# Patient Record
Sex: Male | Born: 1950 | Race: White | Hispanic: No | Smoking: Former smoker
Health system: Southern US, Community
[De-identification: ages and names within clinical notes are randomized; demographics above are authoritative.]

## PROBLEM LIST (undated history)

## (undated) DIAGNOSIS — R112 Nausea with vomiting, unspecified: Secondary | ICD-10-CM

## (undated) DIAGNOSIS — M1990 Unspecified osteoarthritis, unspecified site: Secondary | ICD-10-CM

## (undated) DIAGNOSIS — H3530 Unspecified macular degeneration: Secondary | ICD-10-CM

## (undated) DIAGNOSIS — M779 Enthesopathy, unspecified: Secondary | ICD-10-CM

## (undated) DIAGNOSIS — Z9889 Other specified postprocedural states: Secondary | ICD-10-CM

## (undated) DIAGNOSIS — I10 Essential (primary) hypertension: Secondary | ICD-10-CM

## (undated) DIAGNOSIS — R682 Dry mouth, unspecified: Secondary | ICD-10-CM

## (undated) DIAGNOSIS — C801 Malignant (primary) neoplasm, unspecified: Secondary | ICD-10-CM

## (undated) DIAGNOSIS — T148XXD Other injury of unspecified body region, subsequent encounter: Secondary | ICD-10-CM

## (undated) DIAGNOSIS — K219 Gastro-esophageal reflux disease without esophagitis: Secondary | ICD-10-CM

## (undated) HISTORY — PX: KNEE SURGERY: SHX244

## (undated) HISTORY — PX: WISDOM TOOTH EXTRACTION: SHX21

## (undated) HISTORY — PX: NOSE SURGERY: SHX723

---

## 1997-12-30 ENCOUNTER — Ambulatory Visit (HOSPITAL_COMMUNITY): Admission: RE | Admit: 1997-12-30 | Discharge: 1997-12-30 | Payer: Self-pay | Admitting: Specialist

## 1998-01-13 ENCOUNTER — Ambulatory Visit (HOSPITAL_BASED_OUTPATIENT_CLINIC_OR_DEPARTMENT_OTHER): Admission: RE | Admit: 1998-01-13 | Discharge: 1998-01-13 | Payer: Self-pay | Admitting: Specialist

## 2001-09-07 ENCOUNTER — Emergency Department (HOSPITAL_COMMUNITY): Admission: EM | Admit: 2001-09-07 | Discharge: 2001-09-07 | Payer: Self-pay | Admitting: Emergency Medicine

## 2012-05-15 ENCOUNTER — Encounter (HOSPITAL_COMMUNITY): Payer: Self-pay | Admitting: Emergency Medicine

## 2012-05-15 ENCOUNTER — Emergency Department (HOSPITAL_COMMUNITY)
Admission: EM | Admit: 2012-05-15 | Discharge: 2012-05-15 | Disposition: A | Discharge status: Home / Self Care | Payer: Self-pay | Attending: Emergency Medicine | Admitting: Emergency Medicine

## 2012-05-15 ENCOUNTER — Emergency Department (HOSPITAL_COMMUNITY): Payer: Self-pay

## 2012-05-15 DIAGNOSIS — M7989 Other specified soft tissue disorders: Secondary | ICD-10-CM | POA: Insufficient documentation

## 2012-05-15 DIAGNOSIS — A46 Erysipelas: Secondary | ICD-10-CM | POA: Insufficient documentation

## 2012-05-15 DIAGNOSIS — Z79899 Other long term (current) drug therapy: Secondary | ICD-10-CM | POA: Insufficient documentation

## 2012-05-15 DIAGNOSIS — I10 Essential (primary) hypertension: Secondary | ICD-10-CM | POA: Insufficient documentation

## 2012-05-15 DIAGNOSIS — Z8589 Personal history of malignant neoplasm of other organs and systems: Secondary | ICD-10-CM | POA: Insufficient documentation

## 2012-05-15 DIAGNOSIS — M1710 Unilateral primary osteoarthritis, unspecified knee: Secondary | ICD-10-CM | POA: Insufficient documentation

## 2012-05-15 HISTORY — DX: Unspecified osteoarthritis, unspecified site: M19.90

## 2012-05-15 HISTORY — DX: Essential (primary) hypertension: I10

## 2012-05-15 HISTORY — DX: Malignant (primary) neoplasm, unspecified: C80.1

## 2012-05-15 HISTORY — DX: Enthesopathy, unspecified: M77.9

## 2012-05-15 LAB — CBC WITH DIFFERENTIAL/PLATELET
Eosinophils Absolute: 0.1 10*3/uL (ref 0.0–0.7)
Hemoglobin: 16.8 g/dL (ref 13.0–17.0)
Lymphocytes Relative: 15 % (ref 12–46)
Lymphs Abs: 1.3 10*3/uL (ref 0.7–4.0)
MCH: 30.3 pg (ref 26.0–34.0)
Monocytes Relative: 10 % (ref 3–12)
Neutrophils Relative %: 74 % (ref 43–77)
RBC: 5.54 MIL/uL (ref 4.22–5.81)
WBC: 8.6 10*3/uL (ref 4.0–10.5)

## 2012-05-15 LAB — BASIC METABOLIC PANEL
BUN: 25 mg/dL — ABNORMAL HIGH (ref 6–23)
CO2: 24 mEq/L (ref 19–32)
Chloride: 99 mEq/L (ref 96–112)
GFR calc non Af Amer: 76 mL/min — ABNORMAL LOW (ref >90)
Glucose, Bld: 109 mg/dL — ABNORMAL HIGH (ref 70–99)
Potassium: 3.9 mEq/L (ref 3.5–5.1)

## 2012-05-15 MED ORDER — AMOXICILLIN 500 MG PO CAPS: 500.0000 mg | ORAL_CAPSULE | Freq: Three times a day (TID) | ORAL | Status: AC

## 2012-05-15 NOTE — ED Notes (Signed)
Pt states redness and swelling started Saturday. Denies injury or insect bite.

## 2012-05-15 NOTE — ED Provider Notes (Signed)
Medical screening examination/treatment/procedure(s) were conducted as a shared visit with non-physician practitioner(s) and myself.  I personally evaluated the patient during the encounter [name], M.D.  Harry [name] is a 61 y.o. male presenting with rash on the right lower extremity. Few days ago the patient was feeling ill, had some fevers and chills and subsequently a rash erupted on the right lower extremity to involve the anterior portion of the right lower extremity and calf.  He's not had any injuries to the area, he denies any history of DVT or PE, and has not had any long travel. Patient wants to know what this is, he says the severity is moderate to severe, is been constant, it has been somewhat improving because he feels better and does not feel feverish anymore. He has not taken anything for it.  Review of systems was positive for fever and chills couple of days ago, he denies any nausea vomiting diarrhea, chest pain, shortness of breath, abdominal pain. Patient says he still having minimal right lower extremity pain. No headaches, weakness, numbness.  VITAL SIGNS:   Filed Vitals:   05/15/12 0937  BP: 158/87  Pulse: 106  Temp: 98.8 F (37.1 C)  Resp: 20   CONSTITUTIONAL: Awake, oriented, appears non-toxic HENT: Atraumatic, normocephalic, oral mucosa pink and moist, airway patent. Nares patent without drainage - right-sided naris has been surgically corrected. External ears normal. EYES: Conjunctiva clear, EOMI, PERRLA NECK: Trachea midline, non-tender, supple CARDIOVASCULAR: Normal heart rate, Normal rhythm, No murmurs, rubs, gallops PULMONARY/CHEST: Clear to auscultation, no rhonchi, wheezes, or rales. Symmetrical breath sounds. Non-tender. ABDOMINAL: Non-distended, morbidly obese soft, non-tender - no rebound or guarding.  BS normal. EXTREMITIES: No clubbing, cyanosis, or edema SKIN: Warm, Dry. Raised plaque-like area of erythema on anterior right lower extremity it  also extends into the posterior right upper extremity. This is mildly tender to palpation it is blanchable.  There is no tenderness to palpation in the calf. The right lower extremity is more swollen than the left the  MDM: Harry [name] is a 61 y.o. male presenting with likely right lower extremity erypiselas. There is a suggestion patient could have a DVT in right lower extremity so we'll screen both legs as there is some varicosities in both legs and some swelling in both legs as well.  DVT studies are negative bilaterally. Will put patient on antibiotics. Discussed PA    [name], MD 05/15/12 1700

## 2012-05-15 NOTE — ED Provider Notes (Signed)
History     CSN: [id number]  Arrival date & time 05/15/12  5784   First MD Initiated Contact with Patient 05/15/12 [phone]      Chief Complaint  Patient presents with  . Cellulitis    right leg redness and pain.    (Consider location/radiation/quality/duration/timing/severity/associated sxs/prior treatment) HPI Comments: Harry [name] presents with a 2 day history of right lower extremity redness,  Swelling and tenderness.  He denies trauma, including no falls and no skin injuries.  He did have mild shaking chills which started 3 days ago but has since resolved.  He has taken naproxen which he takes for chronic arthritis pain in his knees which may have helped with the swelling somewhat as he states he had more intense redness on the medial aspect of this leg yesterday which is better today.  He denies chest pain, cough and shortness of breath.  The history is provided by the patient.    Past Medical History  Diagnosis Date  . Hypertension   . Arthritis   . Tendonitis   . 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 Knee surgery     History reviewed. No pertinent family history.  History  Substance Use Topics  . Smoking status: Not on file  . Smokeless tobacco: Not on file  . Alcohol Use: Yes     Comment: occ      Review of Systems  Constitutional: Positive for chills.  HENT: Negative for congestion, sore throat and neck pain.   Eyes: Negative.   Respiratory: Negative for chest tightness and shortness of breath.   Cardiovascular: Negative for chest pain.  Gastrointestinal: Negative for nausea and abdominal pain.  Genitourinary: Negative.   Musculoskeletal: Negative for joint swelling and arthralgias.  Skin: Positive for color change and rash. Negative for wound.  Neurological: Negative for dizziness, weakness, light-headedness, numbness and headaches.  Hematological: Negative.   Psychiatric/Behavioral: Negative.      Allergies  Review of patient's allergies indicates no known allergies.  Home Medications   Current Outpatient Rx  Name  Route  Sig  Dispense  Refill  . OCUVITE PO TABS   Oral   Take 1 tablet by mouth daily.         Marland Kitchen LISINOPRIL-HYDROCHLOROTHIAZIDE 20-12.5 MG PO TABS   Oral   Take 1 tablet by mouth daily.         . ADULT MULTIVITAMIN W/MINERALS CH   Oral   Take 1 tablet by mouth daily.         Marland Kitchen NAPROXEN 500 MG PO TABS   Oral   Take 500 mg by mouth 2 (two) times daily.         . AMOXICILLIN 500 MG PO CAPS   Oral   Take 1 capsule (500 mg total) by mouth 3 (three) times daily.   30 capsule   0     BP 158/87  Pulse 106  Temp 98.8 F (37.1 C) (Oral)  Resp 20  Ht 6\' 4"  (1.93 m)  Wt 350 lb (158.759 kg)  BMI 42.60 kg/m2  SpO2 93%  Physical Exam  Constitutional: He appears well-developed and well-nourished. No distress.  HENT:  Head: Normocephalic.  Neck: Neck supple.  Cardiovascular: Normal rate.   Pulmonary/Chest: Effort normal. He has no wheezes.  Musculoskeletal: Normal range of motion. He exhibits no edema.  Skin: Skin is warm. There is erythema.  Two distinct patches of erythema on entire anterior and posterior  right lower leg, with distinct well demarcated borders,  And rather abrupt line ending at ankle.  Anterior patch is more violaceous,  Blanching,  Not tender,  Posterior erythema lighter in coloration but more tender to palpation.    ED Course  Procedures (including critical care time)  Labs Reviewed  BASIC METABOLIC PANEL - Abnormal; Notable for the following:    Glucose, Bld 109 (*)     BUN 25 (*)     GFR calc non Af Amer 76 (*)     GFR calc Af Amer 88 (*)     All other components within normal limits  CBC WITH DIFFERENTIAL   US Venous Img Lower Bilateral  05/15/2012  *RADIOLOGY REPORT*  Clinical Data: Bilateral leg swelling, right leg redness, pain.  BILATERAL LOWER EXTREMITY VENOUS DOPPLER ULTRASOUND  Technique: Gray-scale  sonography with compression, as well as color and duplex ultrasound, were performed to evaluate the deep venous system from the level of the common femoral vein through the popliteal and proximal calf veins.  Comparison: None  Findings:  Normal compressibility of  the common femoral, superficial femoral, and popliteal veins, as well as the proximal calf veins.  No filling defects to suggest DVT on grayscale or color Doppler imaging.  Doppler waveforms show normal direction of venous flow, normal respiratory phasicity and response to augmentation.  IMPRESSION: No evidence of  lower extremity deep vein thrombosis.   Original Report Authenticated By: D. Andria Rhein, MD      1. Erysipelas of lower extremity       MDM  Pt prescribed amoxil. Advised elevation,  Mild heating pad.  Naproxen for pain.  Offered stronger pain relief, pt deferred.  Recheck by pcp next week,  Recheck sooner for any worsened sx,  Spread of infection,  Fevers,  Etc.        Burgess Amor, [place] 05/15/12 [phone]

## 2012-05-15 NOTE — ED Notes (Signed)
Pt reports right lower leg swelling and redness since sat. Denies any known/recent injury to ext. Area warm and dry, mild swelling noted. Pt denies any fever or chills.

## 2018-05-09 ENCOUNTER — Other Ambulatory Visit: Payer: Self-pay | Admitting: Gastroenterology

## 2018-05-15 ENCOUNTER — Other Ambulatory Visit: Payer: Self-pay | Admitting: Family Medicine

## 2018-05-15 DIAGNOSIS — Z87891 Personal history of nicotine dependence: Secondary | ICD-10-CM

## 2018-05-22 ENCOUNTER — Ambulatory Visit
Admission: RE | Admit: 2018-05-22 | Discharge: 2018-05-22 | Disposition: A | Discharge status: Home / Self Care | Payer: Medicare Other | Source: Ambulatory Visit | Attending: Family Medicine | Admitting: Family Medicine

## 2018-05-22 DIAGNOSIS — Z87891 Personal history of nicotine dependence: Secondary | ICD-10-CM

## 2018-05-30 ENCOUNTER — Encounter (HOSPITAL_COMMUNITY): Payer: Self-pay

## 2018-05-30 ENCOUNTER — Ambulatory Visit (HOSPITAL_COMMUNITY): Admit: 2018-05-30 | Payer: Self-pay | Admitting: Gastroenterology

## 2018-05-30 SURGERY — COLONOSCOPY WITH PROPOFOL
Anesthesia: Monitor Anesthesia Care

## 2019-07-20 ENCOUNTER — Ambulatory Visit: Payer: Medicare Other

## 2019-07-28 ENCOUNTER — Ambulatory Visit: Payer: Medicare Other | Attending: Internal Medicine

## 2019-07-28 DIAGNOSIS — Z23 Encounter for immunization: Secondary | ICD-10-CM | POA: Insufficient documentation

## 2019-07-28 NOTE — Progress Notes (Signed)
   Covid-19 Vaccination Clinic  Name:  Harry [name]    MRN: [id number] DOB: [birth year]  07/28/2019  [name] was observed post Covid-19 immunization for 15 minutes without incidence. He was provided with Vaccine Information Sheet and instruction to access the V-Safe system.   [name] was instructed to call 911 with any severe reactions post vaccine: Marland Kitchen Difficulty breathing  . Swelling of your face and throat  . A fast heartbeat  . A bad rash all over your body  . Dizziness and weakness    Immunizations Administered    Name Date Dose VIS Date Route   Pfizer COVID-19 Vaccine 07/28/2019  3:30 PM 0.3 mL 06/01/2019 Intramuscular   Manufacturer: [place]   Lot: [id number]   [place]: [id number]

## 2019-07-31 ENCOUNTER — Ambulatory Visit: Payer: Medicare Other

## 2019-08-22 ENCOUNTER — Ambulatory Visit: Payer: Medicare Other | Attending: Internal Medicine

## 2019-08-22 DIAGNOSIS — Z23 Encounter for immunization: Secondary | ICD-10-CM

## 2019-08-22 NOTE — Progress Notes (Signed)
   Covid-19 Vaccination Clinic  Name:  QUASIR CAPEHART    MRN: [id number] DOB: July 12, 1950  08/22/2019  Mr. Meth was observed post Covid-19 immunization for 15 minutes without incident. He was provided with Vaccine Information Sheet and instruction to access the V-Safe system.   Mr. Spaulding was instructed to call 911 with any severe reactions post vaccine: Marland Kitchen Difficulty breathing  . Swelling of face and throat  . A fast heartbeat  . A bad rash all over body  . Dizziness and weakness   Immunizations Administered    Name Date Dose VIS Date Route   Pfizer COVID-19 Vaccine 08/22/2019 11:20 AM 0.3 mL 06/01/2019 Intramuscular   Manufacturer: [place]   Lot: [id number]   [place]: [id number]

## 2020-04-14 ENCOUNTER — Other Ambulatory Visit: Payer: Self-pay | Admitting: Family Medicine

## 2020-04-14 DIAGNOSIS — F17210 Nicotine dependence, cigarettes, uncomplicated: Secondary | ICD-10-CM

## 2020-04-28 ENCOUNTER — Ambulatory Visit: Payer: Medicare Other

## 2020-06-25 ENCOUNTER — Inpatient Hospital Stay: Admission: RE | Admit: 2020-06-25 | Payer: Medicare Other | Source: Ambulatory Visit

## 2020-12-08 ENCOUNTER — Ambulatory Visit: Payer: Self-pay | Admitting: Student

## 2020-12-23 ENCOUNTER — Ambulatory Visit: Payer: Self-pay | Admitting: Student

## 2020-12-23 NOTE — H&P (Signed)
TOTAL KNEE ADMISSION H&P  Patient is being admitted for right total knee arthroplasty.  Subjective:  Chief Complaint:right knee pain.  HPI: Harry [name], 70 y.o. male, has a history of pain and functional disability in the right knee due to arthritis and has failed non-surgical conservative treatments for greater than 12 weeks to includeNSAID's and/or analgesics and activity modification.  Onset of symptoms was gradual, starting 3 years ago with gradually worsening course since that time.   Patient currently rates pain in the right knee(s) at 9 out of 10 with activity. Patient has worsening of pain with activity and weight bearing, pain that interferes with activities of daily living, pain with passive range of motion, and crepitus.  Patient has evidence of subchondral cysts, subchondral sclerosis, and joint space narrowing by imaging studies. There is no active infection.  There are no problems to display for this patient.  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      Carboxymethylcellul-Glycerin (LUBRICATING EYE DROPS OP) Place 1 drop into both eyes 2 (two) times daily.      lisinopril-hydrochlorothiazide (PRINZIDE,ZESTORETIC) 20-12.5 MG per tablet Take 1 tablet by mouth daily.      Multiple Vitamin (MULTIVITAMIN WITH MINERALS) TABS Take 1 tablet by mouth daily.      Multiple Vitamins-Minerals (PRESERVISION AREDS 2) CAPS Take 1 capsule by mouth 2 (two) times daily.      naproxen (NAPROSYN) 500 MG tablet Take 500 mg by mouth 2 (two) times daily with a meal.      omeprazole (PRILOSEC) 20 MG capsule Take 20 mg by mouth daily.      PSYLLIUM PO Take 1 Dose by mouth daily.      No current  facility-administered medications for this visit.   No Known Allergies  Social History   Tobacco Use   Smoking status: Not on file   Smokeless tobacco: Not on file  Substance Use Topics   Alcohol use: Yes    Comment: occ    No family history on file.   Review of Systems  Musculoskeletal:  Positive for arthralgias.  All other systems reviewed and are negative.  Objective:  Physical Exam Constitutional:      Appearance: He is obese.  HENT:     Head: Normocephalic.  Eyes:     Pupils: Pupils are equal, round, and reactive to light.  Cardiovascular:     Rate and Rhythm: Normal rate.     Pulses: Normal pulses.  Pulmonary:     Effort: Pulmonary effort is normal.  Abdominal:     Palpations: Abdomen is soft.     Tenderness: There is no abdominal tenderness.  Genitourinary:    Comments: Deferred Musculoskeletal:        General: Tenderness present.     Cervical back: Normal range of motion.  Skin:    General: Skin is warm and dry.  Neurological:     Mental Status: He is oriented to person, place, and time.  Psychiatric:        Mood and Affect: Mood normal.    Vital signs in last 24 hours: @VSRANGES @  Labs:  Estimated body mass index is 42.6 kg/m as calculated from the following:   Height as of 05/15/12: 6\' 4"  (1.93 m).   Weight as of 05/15/12: 158.8 kg.   Imaging Review Plain radiographs demonstrate severe degenerative joint disease of the right knee(s). The bone quality appears to be adequate for age and reported activity level.      Assessment/Plan:  End stage arthritis, right knee   The patient history, physical examination, clinical judgment of the provider and imaging studies are consistent with end stage degenerative joint disease of the right knee(s) and total knee arthroplasty is deemed medically necessary. The treatment options including medical management, injection therapy arthroscopy and arthroplasty were discussed at length. The risks and  benefits of total knee arthroplasty were presented and reviewed. The risks due to aseptic loosening, infection, stiffness, patella tracking problems, thromboembolic complications and other imponderables were discussed. The patient acknowledged the explanation, agreed to proceed with the plan and consent was signed. Patient is being admitted for inpatient treatment for surgery, pain control, PT, OT, prophylactic antibiotics, VTE prophylaxis, progressive ambulation and ADL's and discharge planning. The patient is planning to be discharged  home after overnight observation     Patient's anticipated LOS is less than 2 midnights, meeting these requirements: - Lives within 1 hour of care - Has a competent adult at home to recover with post-op recover - NO history of  - Chronic pain requiring opiods  - Diabetes  - Coronary Artery Disease  - Heart failure  - Heart attack  - Stroke  - DVT/VTE  - Cardiac arrhythmia  - Respiratory Failure/COPD  - Renal failure  - Anemia  - Advanced Liver disease

## 2020-12-23 NOTE — H&P (View-Only) (Signed)
TOTAL KNEE ADMISSION H&P  Patient is being admitted for right total knee arthroplasty.  Subjective:  Chief Complaint:right knee pain.  HPI: Harry [name], 70 y.o. male, has a history of pain and functional disability in the right knee due to arthritis and has failed non-surgical conservative treatments for greater than 12 weeks to includeNSAID's and/or analgesics and activity modification.  Onset of symptoms was gradual, starting 3 years ago with gradually worsening course since that time.   Patient currently rates pain in the right knee(s) at 9 out of 10 with activity. Patient has worsening of pain with activity and weight bearing, pain that interferes with activities of daily living, pain with passive range of motion, and crepitus.  Patient has evidence of subchondral cysts, subchondral sclerosis, and joint space narrowing by imaging studies. There is no active infection.  There are no problems to display for this patient.  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      Carboxymethylcellul-Glycerin (LUBRICATING EYE DROPS OP) Place 1 drop into both eyes 2 (two) times daily.      lisinopril-hydrochlorothiazide (PRINZIDE,ZESTORETIC) 20-12.5 MG per tablet Take 1 tablet by mouth daily.      Multiple Vitamin (MULTIVITAMIN WITH MINERALS) TABS Take 1 tablet by mouth daily.      Multiple Vitamins-Minerals (PRESERVISION AREDS 2) CAPS Take 1 capsule by mouth 2 (two) times daily.      naproxen (NAPROSYN) 500 MG tablet Take 500 mg by mouth 2 (two) times daily with a meal.      omeprazole (PRILOSEC) 20 MG capsule Take 20 mg by mouth daily.      PSYLLIUM PO Take 1 Dose by mouth daily.      No current  facility-administered medications for this visit.   No Known Allergies  Social History   Tobacco Use   Smoking status: Not on file   Smokeless tobacco: Not on file  Substance Use Topics   Alcohol use: Yes    Comment: occ    No family history on file.   Review of Systems  Musculoskeletal:  Positive for arthralgias.  All other systems reviewed and are negative.  Objective:  Physical Exam Constitutional:      Appearance: He is obese.  HENT:     Head: Normocephalic.  Eyes:     Pupils: Pupils are equal, round, and reactive to light.  Cardiovascular:     Rate and Rhythm: Normal rate.     Pulses: Normal pulses.  Pulmonary:     Effort: Pulmonary effort is normal.  Abdominal:     Palpations: Abdomen is soft.     Tenderness: There is no abdominal tenderness.  Genitourinary:    Comments: Deferred Musculoskeletal:        General: Tenderness present.     Cervical back: Normal range of motion.  Skin:    General: Skin is warm and dry.  Neurological:     Mental Status: He is oriented to person, place, and time.  Psychiatric:        Mood and Affect: Mood normal.    Vital signs in last 24 hours: @VSRANGES @  Labs:  Estimated body mass index is 42.6 kg/m as calculated from the following:   Height as of 05/15/12: 6\' 4"  (1.93 m).   Weight as of 05/15/12: 158.8 kg.   Imaging Review Plain radiographs demonstrate severe degenerative joint disease of the right knee(s). The bone quality appears to be adequate for age and reported activity level.      Assessment/Plan:  End stage arthritis, right knee   The patient history, physical examination, clinical judgment of the provider and imaging studies are consistent with end stage degenerative joint disease of the right knee(s) and total knee arthroplasty is deemed medically necessary. The treatment options including medical management, injection therapy arthroscopy and arthroplasty were discussed at length. The risks and  benefits of total knee arthroplasty were presented and reviewed. The risks due to aseptic loosening, infection, stiffness, patella tracking problems, thromboembolic complications and other imponderables were discussed. The patient acknowledged the explanation, agreed to proceed with the plan and consent was signed. Patient is being admitted for inpatient treatment for surgery, pain control, PT, OT, prophylactic antibiotics, VTE prophylaxis, progressive ambulation and ADL's and discharge planning. The patient is planning to be discharged  home after overnight observation     Patient's anticipated LOS is less than 2 midnights, meeting these requirements: - Lives within 1 hour of care - Has a competent adult at home to recover with post-op recover - NO history of  - Chronic pain requiring opiods  - Diabetes  - Coronary Artery Disease  - Heart failure  - Heart attack  - Stroke  - DVT/VTE  - Cardiac arrhythmia  - Respiratory Failure/COPD  - Renal failure  - Anemia  - Advanced Liver disease

## 2020-12-25 NOTE — Progress Notes (Addendum)
COVID Vaccine Completed: Yes x3 Date COVID Vaccine completed: 07-28-19 08-22-19 Has received booster: x1 COVID vaccine manufacturer: Pfizer     Date of COVID positive in last 90 days: N/A  PCP - Buzzy Han, MD.  Office note on chart Cardiologist - N/A  Chest x-ray - N/A EKG - 12-26-20 Epic Stress Test - N/A ECHO - N/A Cardiac Cath - N/A Pacemaker/ICD device last checked: Spinal Cord Stimulator:  Sleep Study - N/A CPAP -   Fasting Blood Sugar - N/A Checks Blood Sugar _____ times a day  Blood Thinner Instructions:  N/A Aspirin Instructions: Last Dose:  Activity level:  Can go up a flight of stairs very slowly due to bilateral knee pain.  Denies chest pain or SOB with stairs or activity.       Anesthesia review: Abnormal EKG at PAT appointment (discussed with Levada Dy, NP)  Patient denies dizziness, chest pain, SOB.  Stop Bang 5  Patient denies shortness of breath, fever, cough and chest pain at PAT appointment   Patient verbalized understanding of instructions that were given to them at the PAT appointment. Patient was also instructed that they will need to review over the PAT instructions again at home before surgery.

## 2020-12-25 NOTE — Patient Instructions (Addendum)
DUE TO COVID-19 ONLY ONE VISITOR IS ALLOWED TO COME WITH YOU AND STAY IN THE WAITING ROOM ONLY DURING PRE OP AND PROCEDURE.   **NO VISITORS ARE ALLOWED IN THE SHORT STAY AREA OR RECOVERY ROOM!!**  IF YOU WILL BE ADMITTED INTO THE HOSPITAL YOU ARE ALLOWED ONLY TWO SUPPORT PEOPLE DURING VISITATION HOURS ONLY (10AM -8PM)   The support person(s) may change daily. The support person(s) must pass our screening, gel in and out, and wear a mask at all times, including in the patient's room. Patients must also wear a mask when staff or their support person are in the room.  No visitors under the age of 12. Any visitor under the age of 44 must be accompanied by an adult.    COVID SWAB TESTING MUST BE COMPLETED ON:  Monday, 01-05-21 @ 9:00 AM   [address] [place], [place] [address]   You are not required to quarantine, however you are required to wear a well-fitted mask when you are out and around people not in your household.  Hand Hygiene often Do NOT share personal items Notify your provider if you are in close contact with someone who has COVID or you develop fever 100.4 or greater, new onset of sneezing, cough, sore throat, shortness of breath or body aches.         Your procedure is scheduled on: Wednesday, 01-07-21   Report to [facility] Main  Entrance    Report to admitting at 8:50 AM   Call this number if you have problems the morning of surgery [phone]   Do not eat food :After Midnight.   May have liquids until 8:15 AM day of surgery  CLEAR LIQUID DIET  Foods Allowed                                                                     Foods Excluded  Water, Black Coffee and tea, regular and decaf              liquids that you cannot  Plain Jell-O in any flavor  (No red)                                    see through such as: Fruit ices (not with fruit pulp)                                      milk, soups, orange juice              Iced Popsicles (No red)                                       All solid food                                   Apple juices Sports drinks like Gatorade (No red) Lightly seasoned clear broth or consume(fat free) Sugar, honey syrup  Complete one Ensure drink the morning of surgery at  8:15 AM  the day of surgery.     The day of surgery:  Drink ONE (1) Pre-Surgery Clear Ensure by am the morning of surgery. Drink in one sitting. Do not sip.  This drink was given to you during your hospital  pre-op appointment visit. Nothing else to drink after completing the  Pre-Surgery Clear Ensure.          If you have questions, please contact your surgeon's office.     Oral Hygiene is also important to reduce your risk of infection.                                    Remember - BRUSH YOUR TEETH THE MORNING OF SURGERY WITH YOUR REGULAR TOOTHPASTE   Do NOT smoke after Midnight   Take these medicines the morning of surgery with A SIP OF WATER:  Atorvastatin, Omeprazole                             You may not have any metal on your body including  jewelry, and body piercing             Do not wear lotions, powders, cologne, or deodorant              Men may shave face and neck.   Do not bring valuables to the hospital. [place].   Contacts, dentures or bridgework may not be worn into surgery.   Bring small overnight bag day of surgery.   Please read over the following fact sheets you were given: IF YOU HAVE QUESTIONS ABOUT YOUR PRE OP INSTRUCTIONS PLEASE CALL [facility] - Preparing for Surgery Before surgery, you can play an important role.  Because skin is not sterile, your skin needs to be as free of germs as possible.  You can reduce the number of germs on your skin by washing with CHG (chlorahexidine gluconate) soap before surgery.  CHG is an antiseptic cleaner which kills germs and bonds with the skin to continue killing germs even after washing. Please DO  NOT use if you have an allergy to CHG or antibacterial soaps.  If your skin becomes reddened/irritated stop using the CHG and inform your nurse when you arrive at Short Stay. Do not shave (including legs and underarms) for at least 48 hours prior to the first CHG shower.  You may shave your face/neck.  Please follow these instructions carefully:  1.  Shower with CHG Soap the night before surgery and the  morning of surgery.  2.  If you choose to wash your hair, wash your hair first as usual with your normal  shampoo.  3.  After you shampoo, rinse your hair and body thoroughly to remove the shampoo.                             4.  Use CHG as you would any other liquid soap.  You can apply chg directly to the skin and wash.  Gently with a scrungie or clean washcloth.  5.  Apply the CHG Soap to your body ONLY FROM THE NECK DOWN.   Do   not use on face/ open  Wound or open sores. Avoid contact with eyes, ears mouth and   genitals (private parts).                       Wash face,  Genitals (private parts) with your normal soap.             6.  Wash thoroughly, paying special attention to the area where your    surgery  will be performed.  7.  Thoroughly rinse your body with warm water from the neck down.  8.  DO NOT shower/wash with your normal soap after using and rinsing off the CHG Soap.                9.  Pat yourself dry with a clean towel.            10.  Wear clean pajamas.            11.  Place clean sheets on your bed the night of your first shower and do not  sleep with pets. Day of Surgery : Do not apply any lotions/deodorants the morning of surgery.  Please wear clean clothes to the hospital/surgery center.  FAILURE TO FOLLOW THESE INSTRUCTIONS MAY RESULT IN THE CANCELLATION OF YOUR SURGERY  PATIENT SIGNATURE_________________________________  NURSE  SIGNATURE__________________________________  ________________________________________________________________________   Harry [name]  An incentive spirometer is a tool that can help keep your lungs clear and active. This tool measures how well you are filling your lungs with each breath. Taking long deep breaths may help reverse or decrease the chance of developing breathing (pulmonary) problems (especially infection) following: A long period of time when you are unable to move or be active. BEFORE THE PROCEDURE  If the spirometer includes an indicator to show your best effort, your nurse or respiratory therapist will set it to a desired goal. If possible, sit up straight or lean slightly forward. Try not to slouch. Hold the incentive spirometer in an upright position. INSTRUCTIONS FOR USE  Sit on the edge of your bed if possible, or sit up as far as you can in bed or on a chair. Hold the incentive spirometer in an upright position. Breathe out normally. Place the mouthpiece in your mouth and seal your lips tightly around it. Breathe in slowly and as deeply as possible, raising the piston or the ball toward the top of the column. Hold your breath for 3-5 seconds or for as long as possible. Allow the piston or ball to fall to the bottom of the column. Remove the mouthpiece from your mouth and breathe out normally. Rest for a few seconds and repeat Steps 1 through 7 at least 10 times every 1-2 hours when you are awake. Take your time and take a few normal breaths between deep breaths. The spirometer may include an indicator to show your best effort. Use the indicator as a goal to work toward during each repetition. After each set of 10 deep breaths, practice coughing to be sure your lungs are clear. If you have an incision (the cut made at the time of surgery), support your incision when coughing by placing a pillow or rolled up towels firmly against it. Once you are able to get out of  bed, walk around indoors and cough well. You may stop using the incentive spirometer when instructed by your caregiver.  RISKS AND COMPLICATIONS Take your time so you do not get dizzy or light-headed. If you are in pain, you  may need to take or ask for pain medication before doing incentive spirometry. It is harder to take a deep breath if you are having pain. AFTER USE Rest and breathe slowly and easily. It can be helpful to keep track of a log of your progress. Your caregiver can provide you with a simple table to help with this. If you are using the spirometer at home, follow these instructions: [place] IF:  You are having difficultly using the spirometer. You have trouble using the spirometer as often as instructed. Your pain medication is not giving enough relief while using the spirometer. You develop fever of 100.5 F (38.1 C) or higher. SEEK IMMEDIATE MEDICAL CARE IF:  You cough up bloody sputum that had not been present before. You develop fever of 102 F (38.9 C) or greater. You develop worsening pain at or near the incision site. MAKE SURE YOU:  Understand these instructions. Will watch your condition. Will get help right away if you are not doing well or get worse. Document Released: 10/18/2006 Document Revised: 08/30/2011 Document Reviewed: 12/19/2006 ExitCare Patient Information 2014 ExitCare, [place].   ________________________________________________________________________  WHAT IS A BLOOD TRANSFUSION? Blood Transfusion Information  A transfusion is the replacement of blood or some of its parts. Blood is made up of multiple cells which provide different functions. Red blood cells carry oxygen and are used for blood loss replacement. White blood cells fight against infection. Platelets control bleeding. Plasma helps clot blood. Other blood products are available for specialized needs, such as hemophilia or other clotting disorders. BEFORE THE TRANSFUSION   Who gives blood for transfusions?  Healthy volunteers who are fully evaluated to make sure their blood is safe. This is blood bank blood. Transfusion therapy is the safest it has ever been in the practice of medicine. Before blood is taken from a donor, a complete history is taken to make sure that person has no history of diseases nor engages in risky social behavior (examples are intravenous drug use or sexual activity with multiple partners). The donor's travel history is screened to minimize risk of transmitting infections, such as malaria. The donated blood is tested for signs of infectious diseases, such as HIV and hepatitis. The blood is then tested to be sure it is compatible with you in order to minimize the chance of a transfusion reaction. If you or a relative donates blood, this is often done in anticipation of surgery and is not appropriate for emergency situations. It takes many days to process the donated blood. RISKS AND COMPLICATIONS Although transfusion therapy is very safe and saves many lives, the main dangers of transfusion include:  Getting an infectious disease. Developing a transfusion reaction. This is an allergic reaction to something in the blood you were given. Every precaution is taken to prevent this. The decision to have a blood transfusion has been considered carefully by your caregiver before blood is given. Blood is not given unless the benefits outweigh the risks. AFTER THE TRANSFUSION Right after receiving a blood transfusion, you will usually feel much better and more energetic. This is especially true if your red blood cells have gotten low (anemic). The transfusion raises the level of the red blood cells which carry oxygen, and this usually causes an energy increase. The nurse administering the transfusion will monitor you carefully for complications. HOME CARE INSTRUCTIONS  No special instructions are needed after a transfusion. You may find your energy is  better. Speak with your caregiver about any limitations on  activity for underlying diseases you may have. SEEK MEDICAL CARE IF:  Your condition is not improving after your transfusion. You develop redness or irritation at the intravenous (IV) site. SEEK IMMEDIATE MEDICAL CARE IF:  Any of the following symptoms occur over the next 12 hours: Shaking chills. You have a temperature by mouth above 102 F (38.9 C), not controlled by medicine. Chest, back, or muscle pain. People around you feel you are not acting correctly or are confused. Shortness of breath or difficulty breathing. Dizziness and fainting. You get a rash or develop hives. You have a decrease in urine output. Your urine turns a dark color or changes to pink, red, or brown. Any of the following symptoms occur over the next 10 days: You have a temperature by mouth above 102 F (38.9 C), not controlled by medicine. Shortness of breath. Weakness after normal activity. The white part of the eye turns yellow (jaundice). You have a decrease in the amount of urine or are urinating less often. Your urine turns a dark color or changes to pink, red, or brown. Document Released: 06/04/2000 Document Revised: 08/30/2011 Document Reviewed: 01/22/2008 [facility] Patient Information 2014 [place], [place].  _______________________________________________________________________

## 2020-12-26 ENCOUNTER — Encounter (HOSPITAL_COMMUNITY)
Admission: RE | Admit: 2020-12-26 | Discharge: 2020-12-26 | Disposition: A | Discharge status: Home / Self Care | Payer: Medicare Other | Source: Ambulatory Visit | Attending: Orthopedic Surgery | Admitting: Orthopedic Surgery

## 2020-12-26 ENCOUNTER — Other Ambulatory Visit: Payer: Self-pay

## 2020-12-26 ENCOUNTER — Encounter (HOSPITAL_COMMUNITY): Payer: Self-pay

## 2020-12-26 DIAGNOSIS — Z01818 Encounter for other preprocedural examination: Secondary | ICD-10-CM | POA: Insufficient documentation

## 2020-12-26 HISTORY — DX: Gastro-esophageal reflux disease without esophagitis: K21.9

## 2020-12-26 LAB — COMPREHENSIVE METABOLIC PANEL
ALT: 21 U/L (ref 0–44)
AST: 20 U/L (ref 15–41)
Albumin: 4.3 g/dL (ref 3.5–5.0)
Alkaline Phosphatase: 81 U/L (ref 38–126)
Anion gap: 10 (ref 5–15)
BUN: 22 mg/dL (ref 8–23)
CO2: 22 mmol/L (ref 22–32)
Calcium: 9.3 mg/dL (ref 8.9–10.3)
Chloride: 103 mmol/L (ref 98–111)
Creatinine, Ser: 1.14 mg/dL (ref 0.61–1.24)
GFR, Estimated: >60 mL/min (ref >60)
Glucose, Bld: 106 mg/dL — ABNORMAL HIGH (ref 70–99)
Potassium: 3.9 mmol/L (ref 3.5–5.1)
Sodium: 135 mmol/L (ref 135–145)
Total Bilirubin: 1.7 mg/dL — ABNORMAL HIGH (ref 0.3–1.2)
Total Protein: 7.4 g/dL (ref 6.5–8.1)

## 2020-12-26 LAB — URINALYSIS, ROUTINE W REFLEX MICROSCOPIC
Bilirubin Urine: NEGATIVE
Glucose, UA: NEGATIVE mg/dL
Hgb urine dipstick: NEGATIVE
Ketones, ur: 5 mg/dL — AB
Nitrite: NEGATIVE
Protein, ur: NEGATIVE mg/dL
Specific Gravity, Urine: 1.02 (ref 1.005–1.030)
pH: 5 (ref 5.0–8.0)

## 2020-12-26 LAB — TYPE AND SCREEN
ABO/RH(D): O NEG
Antibody Screen: NEGATIVE

## 2020-12-26 LAB — CBC
HCT: 48 % (ref 39.0–52.0)
Hemoglobin: 15.7 g/dL (ref 13.0–17.0)
MCH: 29.5 pg (ref 26.0–34.0)
MCHC: 32.7 g/dL (ref 30.0–36.0)
MCV: 90.2 fL (ref 80.0–100.0)
Platelets: 180 10*3/uL (ref 150–400)
RBC: 5.32 MIL/uL (ref 4.22–5.81)
RDW: 14.4 % (ref 11.5–15.5)
WBC: 8.5 10*3/uL (ref 4.0–10.5)
nRBC: 0 % (ref 0.0–0.2)

## 2020-12-26 LAB — SURGICAL PCR SCREEN
MRSA, PCR: NEGATIVE
Staphylococcus aureus: NEGATIVE

## 2020-12-26 LAB — PROTIME-INR
INR: 1.1 (ref 0.8–1.2)
Prothrombin Time: 14.2 seconds (ref 11.4–15.2)

## 2020-12-26 NOTE — Anesthesia Preprocedure Evaluation (Addendum)
Anesthesia Evaluation  Patient identified by MRN, date of birth, ID band Patient awake    Reviewed: Allergy & Precautions, NPO status , Patient's Chart, lab work & pertinent test results  Airway Mallampati: II  TM Distance: >3 FB Neck ROM: Full    Dental  (+) Missing, Loose, Poor Dentition   Pulmonary neg pulmonary ROS, former smoker,    Pulmonary exam normal breath sounds clear to auscultation + decreased breath sounds      Cardiovascular hypertension, Normal cardiovascular exam Rhythm:Regular Rate:Normal     Neuro/Psych negative neurological ROS  negative psych ROS   GI/Hepatic Neg liver ROS, GERD  ,  Endo/Other  Morbid obesity  Renal/GU negative Renal ROS  negative genitourinary   Musculoskeletal negative musculoskeletal ROS (+)   Abdominal (+) + obese,   Peds negative pediatric ROS (+)  Hematology negative hematology ROS (+)   Anesthesia Other Findings   Reproductive/Obstetrics negative OB ROS                            Anesthesia Physical Anesthesia Plan  ASA: 3  Anesthesia Plan: Spinal   Post-op Pain Management:  Regional for Post-op pain   Induction: Intravenous  PONV Risk Score and Plan: 2 and Propofol infusion, Dexamethasone and Ondansetron  Airway Management Planned: Simple Face Mask  Additional Equipment:   Intra-op Plan:   Post-operative Plan:   Informed Consent: I have reviewed the patients History and Physical, chart, labs and discussed the procedure including the risks, benefits and alternatives for the proposed anesthesia with the patient or authorized representative who has indicated his/her understanding and acceptance.     Dental advisory given  Plan Discussed with: CRNA and Surgeon  Anesthesia Plan Comments: (See APP note by Durel Salts, FNP)       Anesthesia Quick Evaluation

## 2020-12-26 NOTE — Progress Notes (Signed)
Anesthesia Chart Review:   Case: [id number] Date/Time: 01/07/21 1110   Procedure: COMPUTER ASSISTED TOTAL KNEE ARTHROPLASTY (Right: Knee)   Anesthesia type: Spinal   Pre-op diagnosis: right knee osteoarthritis   Location: [facility] ROOM 07 / [facility]   Surgeons: Rod Can, MD       DISCUSSION: Pt is 70 years old with hx HTN  At pre-surgical testing, pt denied chest pain, SOB, dizziness, syncope, palpitations  Reviewed case with Dr. Sabra Heck   VS: BP 139/76   Pulse 60   Temp 37.2 C (Oral)   Resp 20   Ht 6' 3.5" (1.918 m)   Wt (!) 146.9 kg   SpO2 97%   BMI 39.94 kg/m   PROVIDERS: - PCP is Buzzy Han, MD   LABS: Labs reviewed: Acceptable for surgery. (all labs ordered are listed, but only abnormal results are displayed)  Labs Reviewed  COMPREHENSIVE METABOLIC PANEL - Abnormal; Notable for the following components:      Result Value   Glucose, Bld 106 (*)    Total Bilirubin 1.7 (*)    All other components within normal limits  URINALYSIS, ROUTINE W REFLEX MICROSCOPIC - Abnormal; Notable for the following components:   APPearance HAZY (*)    Ketones, ur 5 (*)    Leukocytes,Ua SMALL (*)    Bacteria, UA RARE (*)    All other components within normal limits  SURGICAL PCR SCREEN  CBC  PROTIME-INR  TYPE AND SCREEN    EKG 12/26/20: Sinus rhythm with frequent PVCs in a pattern of bigeminy Left anterior fasicular block Anterior infarct , age undetermined   CV: N/A  Past Medical History:  Diagnosis Date   Arthritis    Cancer ([place])    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   GERD (gastroesophageal reflux disease)    Hypertension    Tendonitis     Past Surgical History:  Procedure Laterality Date   KNEE SURGERY     NOSE SURGERY     WISDOM TOOTH EXTRACTION      MEDICATIONS:  atorvastatin (LIPITOR) 20 MG tablet   Carboxymethylcellul-Glycerin (LUBRICATING EYE DROPS OP)   lisinopril-hydrochlorothiazide (PRINZIDE,ZESTORETIC) 20-12.5 MG per tablet   Multiple  Vitamin (MULTIVITAMIN WITH MINERALS) TABS   Multiple Vitamins-Minerals (PRESERVISION AREDS 2) CAPS   naproxen (NAPROSYN) 500 MG tablet   omeprazole (PRILOSEC) 20 MG capsule   PSYLLIUM PO   No current facility-administered medications for this encounter.    If no changes, I anticipate pt can proceed with surgery as scheduled.   Willeen Cass, PhD, FNP-BC [facility] Short Stay Surgical Center/Anesthesiology Phone: [phone] 12/26/2020 1:40 PM

## 2020-12-26 NOTE — Progress Notes (Signed)
   12/26/20 0858  OBSTRUCTIVE SLEEP APNEA  Have you ever been diagnosed with sleep apnea through a sleep study? No  Do you snore loudly (loud enough to be heard through closed doors)?  0  Do you often feel tired, fatigued, or sleepy during the daytime (such as falling asleep during driving or talking to someone)? 0  Has anyone observed you stop breathing during your sleep? 0  Do you have, or are you being treated for high blood pressure? 1  BMI more than 35 kg/m2? 1  Age > 50 (1-yes) 1  Neck circumference greater than:Male 16 inches or larger, Male 17inches or larger? 1  Male Gender (Yes=1) 1  Obstructive Sleep Apnea Score 5  Score 5 or greater  Results sent to PCP

## 2021-01-05 ENCOUNTER — Other Ambulatory Visit (HOSPITAL_COMMUNITY)
Admission: RE | Admit: 2021-01-05 | Discharge: 2021-01-05 | Disposition: A | Discharge status: Home / Self Care | Payer: Medicare Other | Source: Ambulatory Visit | Attending: Orthopedic Surgery | Admitting: Orthopedic Surgery

## 2021-01-05 DIAGNOSIS — Z01812 Encounter for preprocedural laboratory examination: Secondary | ICD-10-CM | POA: Diagnosis present

## 2021-01-05 DIAGNOSIS — Z20822 Contact with and (suspected) exposure to covid-19: Secondary | ICD-10-CM | POA: Insufficient documentation

## 2021-01-05 LAB — SARS CORONAVIRUS 2 (TAT 6-24 HRS): SARS Coronavirus 2: NEGATIVE

## 2021-01-07 ENCOUNTER — Ambulatory Visit (HOSPITAL_COMMUNITY): Payer: Medicare Other

## 2021-01-07 ENCOUNTER — Ambulatory Visit (HOSPITAL_COMMUNITY): Payer: Medicare Other | Admitting: Emergency Medicine

## 2021-01-07 ENCOUNTER — Observation Stay (HOSPITAL_COMMUNITY)
Admission: RE | Admit: 2021-01-07 | Discharge: 2021-01-08 | Disposition: A | Discharge status: Home / Self Care | Payer: Medicare Other | Attending: Orthopedic Surgery | Admitting: Orthopedic Surgery

## 2021-01-07 ENCOUNTER — Other Ambulatory Visit: Payer: Self-pay

## 2021-01-07 ENCOUNTER — Encounter (HOSPITAL_COMMUNITY)
Admission: RE | Disposition: A | Discharge status: Home / Self Care | Payer: Self-pay | Source: Home / Self Care | Attending: Orthopedic Surgery

## 2021-01-07 ENCOUNTER — Ambulatory Visit (HOSPITAL_COMMUNITY): Payer: Medicare Other | Admitting: Registered Nurse

## 2021-01-07 ENCOUNTER — Encounter (HOSPITAL_COMMUNITY): Payer: Self-pay | Admitting: Orthopedic Surgery

## 2021-01-07 DIAGNOSIS — Z85828 Personal history of other malignant neoplasm of skin: Secondary | ICD-10-CM | POA: Insufficient documentation

## 2021-01-07 DIAGNOSIS — M1711 Unilateral primary osteoarthritis, right knee: Principal | ICD-10-CM | POA: Diagnosis present

## 2021-01-07 DIAGNOSIS — Z79899 Other long term (current) drug therapy: Secondary | ICD-10-CM | POA: Insufficient documentation

## 2021-01-07 DIAGNOSIS — I10 Essential (primary) hypertension: Secondary | ICD-10-CM | POA: Insufficient documentation

## 2021-01-07 HISTORY — PX: KNEE ARTHROPLASTY: SHX992

## 2021-01-07 LAB — ABO/RH: ABO/RH(D): O NEG

## 2021-01-07 SURGERY — ARTHROPLASTY, KNEE, TOTAL, USING IMAGELESS COMPUTER-ASSISTED NAVIGATION
Anesthesia: Spinal | Site: Knee | Laterality: Right

## 2021-01-07 MED ORDER — PHENYLEPHRINE HCL-NACL 10-0.9 MG/250ML-% IV SOLN
INTRAVENOUS | Status: AC
  Filled 2021-01-07: qty 250

## 2021-01-07 MED ORDER — PROSIGHT PO TABS
1.0000 | ORAL_TABLET | Freq: Two times a day (BID) | ORAL | Status: DC
  Administered 2021-01-07 – 2021-01-08 (×2): 1 via ORAL
  Filled 2021-01-07 (×2): qty 1

## 2021-01-07 MED ORDER — HYDROMORPHONE HCL 1 MG/ML IJ SOLN: 0.2500 mg | INTRAMUSCULAR | Status: DC | PRN

## 2021-01-07 MED ORDER — LIDOCAINE 2% (20 MG/ML) 5 ML SYRINGE
INTRAMUSCULAR | Status: AC
  Filled 2021-01-07: qty 5

## 2021-01-07 MED ORDER — LACTATED RINGERS IV SOLN: INTRAVENOUS | Status: DC

## 2021-01-07 MED ORDER — BUPIVACAINE IN DEXTROSE 0.75-8.25 % IT SOLN
INTRATHECAL | Status: DC | PRN
  Administered 2021-01-07: 2 mL via INTRATHECAL

## 2021-01-07 MED ORDER — CEFAZOLIN IN SODIUM CHLORIDE 3-0.9 GM/100ML-% IV SOLN
3.0000 g | INTRAVENOUS | Status: AC
  Administered 2021-01-07: 3 g via INTRAVENOUS
  Filled 2021-01-07: qty 100

## 2021-01-07 MED ORDER — LISINOPRIL-HYDROCHLOROTHIAZIDE 20-12.5 MG PO TABS: 1.0000 | ORAL_TABLET | Freq: Every day | ORAL | Status: DC

## 2021-01-07 MED ORDER — LISINOPRIL 20 MG PO TABS
20.0000 mg | ORAL_TABLET | Freq: Every day | ORAL | Status: DC
  Administered 2021-01-07 – 2021-01-08 (×2): 20 mg via ORAL
  Filled 2021-01-07 (×2): qty 1

## 2021-01-07 MED ORDER — DEXAMETHASONE SODIUM PHOSPHATE 10 MG/ML IJ SOLN
10.0000 mg | Freq: Once | INTRAMUSCULAR | Status: AC
  Administered 2021-01-08: 10 mg via INTRAVENOUS
  Filled 2021-01-07: qty 1

## 2021-01-07 MED ORDER — KETOROLAC TROMETHAMINE 30 MG/ML IJ SOLN
INTRAMUSCULAR | Status: DC | PRN
  Administered 2021-01-07: 30 mg via INTRA_ARTICULAR

## 2021-01-07 MED ORDER — POLYVINYL ALCOHOL 1.4 % OP SOLN
1.0000 [drp] | Freq: Two times a day (BID) | OPHTHALMIC | Status: DC
  Administered 2021-01-07 – 2021-01-08 (×2): 1 [drp] via OPHTHALMIC
  Filled 2021-01-07: qty 15

## 2021-01-07 MED ORDER — SODIUM CHLORIDE 0.9 % IR SOLN
Status: DC | PRN
  Administered 2021-01-07: 1000 mL

## 2021-01-07 MED ORDER — DOCUSATE SODIUM 100 MG PO CAPS
100.0000 mg | ORAL_CAPSULE | Freq: Two times a day (BID) | ORAL | Status: DC
  Administered 2021-01-07 – 2021-01-08 (×2): 100 mg via ORAL
  Filled 2021-01-07 (×2): qty 1

## 2021-01-07 MED ORDER — BUPIVACAINE HCL (PF) 0.5 % IJ SOLN
INTRAMUSCULAR | Status: DC | PRN
  Administered 2021-01-07: 25 mL via PERINEURAL

## 2021-01-07 MED ORDER — ONDANSETRON HCL 4 MG PO TABS: 4.0000 mg | ORAL_TABLET | Freq: Four times a day (QID) | ORAL | Status: DC | PRN

## 2021-01-07 MED ORDER — POLYETHYLENE GLYCOL 3350 17 G PO PACK: 17.0000 g | PACK | Freq: Every day | ORAL | Status: DC | PRN

## 2021-01-07 MED ORDER — DEXAMETHASONE SODIUM PHOSPHATE 10 MG/ML IJ SOLN
INTRAMUSCULAR | Status: DC | PRN
  Administered 2021-01-07: 10 mg via INTRAVENOUS

## 2021-01-07 MED ORDER — PHENOL 1.4 % MT LIQD: 1.0000 | OROMUCOSAL | Status: DC | PRN

## 2021-01-07 MED ORDER — POVIDONE-IODINE 10 % EX SWAB
2.0000 "application " | Freq: Once | CUTANEOUS | Status: AC
  Administered 2021-01-07: 2 via TOPICAL

## 2021-01-07 MED ORDER — DEXAMETHASONE SODIUM PHOSPHATE 10 MG/ML IJ SOLN
INTRAMUSCULAR | Status: AC
  Filled 2021-01-07: qty 1

## 2021-01-07 MED ORDER — ONDANSETRON HCL 4 MG/2ML IJ SOLN: 4.0000 mg | Freq: Once | INTRAMUSCULAR | Status: DC | PRN

## 2021-01-07 MED ORDER — LIDOCAINE 2% (20 MG/ML) 5 ML SYRINGE
INTRAMUSCULAR | Status: DC | PRN
  Administered 2021-01-07: 40 mg via INTRAVENOUS

## 2021-01-07 MED ORDER — SENNA 8.6 MG PO TABS
1.0000 | ORAL_TABLET | Freq: Two times a day (BID) | ORAL | Status: DC
  Administered 2021-01-07 – 2021-01-08 (×2): 8.6 mg via ORAL
  Filled 2021-01-07 (×2): qty 1

## 2021-01-07 MED ORDER — PROPOFOL 1000 MG/100ML IV EMUL
INTRAVENOUS | Status: AC
  Filled 2021-01-07: qty 100

## 2021-01-07 MED ORDER — OXYCODONE HCL 5 MG PO TABS
5.0000 mg | ORAL_TABLET | ORAL | Status: DC | PRN
  Administered 2021-01-08 (×3): 10 mg via ORAL
  Filled 2021-01-07 (×2): qty 2

## 2021-01-07 MED ORDER — BUPIVACAINE-EPINEPHRINE 0.25% -1:200000 IJ SOLN
INTRAMUSCULAR | Status: DC | PRN
  Administered 2021-01-07: 30 mL

## 2021-01-07 MED ORDER — SODIUM CHLORIDE 0.9 % IV SOLN: INTRAVENOUS | Status: DC

## 2021-01-07 MED ORDER — METHOCARBAMOL 500 MG IVPB - SIMPLE MED
500.0000 mg | Freq: Four times a day (QID) | INTRAVENOUS | Status: DC | PRN
  Filled 2021-01-07: qty 50

## 2021-01-07 MED ORDER — PHENYLEPHRINE HCL-NACL 10-0.9 MG/250ML-% IV SOLN
INTRAVENOUS | Status: DC | PRN
  Administered 2021-01-07: 40 ug/min via INTRAVENOUS

## 2021-01-07 MED ORDER — POVIDONE-IODINE 10 % EX SWAB: 2.0000 "application " | Freq: Once | CUTANEOUS | Status: AC

## 2021-01-07 MED ORDER — SODIUM CHLORIDE (PF) 0.9 % IJ SOLN
INTRAMUSCULAR | Status: DC | PRN
  Administered 2021-01-07: 30 mL

## 2021-01-07 MED ORDER — OXYCODONE HCL 5 MG PO TABS
10.0000 mg | ORAL_TABLET | ORAL | Status: DC | PRN
Start: 2021-01-07 — End: 2021-01-08
  Administered 2021-01-07: 10 mg via ORAL
  Filled 2021-01-07 (×2): qty 2

## 2021-01-07 MED ORDER — PHENYLEPHRINE 40 MCG/ML (10ML) SYRINGE FOR IV PUSH (FOR BLOOD PRESSURE SUPPORT)
PREFILLED_SYRINGE | INTRAVENOUS | Status: DC | PRN
  Administered 2021-01-07: 120 ug via INTRAVENOUS

## 2021-01-07 MED ORDER — HYDROMORPHONE HCL 1 MG/ML IJ SOLN: 0.5000 mg | INTRAMUSCULAR | Status: DC | PRN

## 2021-01-07 MED ORDER — CELECOXIB 200 MG PO CAPS
200.0000 mg | ORAL_CAPSULE | Freq: Two times a day (BID) | ORAL | Status: DC
  Administered 2021-01-07 – 2021-01-08 (×2): 200 mg via ORAL
  Filled 2021-01-07 (×2): qty 1

## 2021-01-07 MED ORDER — BUPIVACAINE-EPINEPHRINE (PF) 0.25% -1:200000 IJ SOLN
INTRAMUSCULAR | Status: AC
  Filled 2021-01-07: qty 30

## 2021-01-07 MED ORDER — STERILE WATER FOR IRRIGATION IR SOLN
Status: DC | PRN
  Administered 2021-01-07: 2000 mL

## 2021-01-07 MED ORDER — MIDAZOLAM HCL 2 MG/2ML IJ SOLN
1.0000 mg | INTRAMUSCULAR | Status: DC
  Administered 2021-01-07: 2 mg via INTRAVENOUS
  Filled 2021-01-07: qty 2

## 2021-01-07 MED ORDER — KETOROLAC TROMETHAMINE 30 MG/ML IJ SOLN
INTRAMUSCULAR | Status: AC
  Filled 2021-01-07: qty 1

## 2021-01-07 MED ORDER — HYDROCHLOROTHIAZIDE 12.5 MG PO CAPS
12.5000 mg | ORAL_CAPSULE | Freq: Every day | ORAL | Status: DC
  Administered 2021-01-07 – 2021-01-08 (×2): 12.5 mg via ORAL
  Filled 2021-01-07 (×2): qty 1

## 2021-01-07 MED ORDER — DEXMEDETOMIDINE (PRECEDEX) IN NS 20 MCG/5ML (4 MCG/ML) IV SYRINGE
PREFILLED_SYRINGE | INTRAVENOUS | Status: DC | PRN
  Administered 2021-01-07 (×2): 20 ug via INTRAVENOUS

## 2021-01-07 MED ORDER — ISOPROPYL ALCOHOL 70 % SOLN
Status: DC | PRN
  Administered 2021-01-07: 1 via TOPICAL

## 2021-01-07 MED ORDER — METOCLOPRAMIDE HCL 5 MG/ML IJ SOLN
5.0000 mg | Freq: Three times a day (TID) | INTRAMUSCULAR | Status: DC | PRN
Start: 2021-01-07 — End: 2021-01-08

## 2021-01-07 MED ORDER — MENTHOL 3 MG MT LOZG: 1.0000 | LOZENGE | OROMUCOSAL | Status: DC | PRN

## 2021-01-07 MED ORDER — FENTANYL CITRATE (PF) 100 MCG/2ML IJ SOLN
50.0000 ug | INTRAMUSCULAR | Status: DC
  Administered 2021-01-07: 50 ug via INTRAVENOUS
  Filled 2021-01-07: qty 2

## 2021-01-07 MED ORDER — PSYLLIUM 95 % PO PACK
1.0000 | PACK | Freq: Every day | ORAL | Status: DC
  Administered 2021-01-08: 1 via ORAL
  Filled 2021-01-07 (×2): qty 1

## 2021-01-07 MED ORDER — DIPHENHYDRAMINE HCL 12.5 MG/5ML PO ELIX: 12.5000 mg | ORAL_SOLUTION | ORAL | Status: DC | PRN

## 2021-01-07 MED ORDER — TRANEXAMIC ACID-NACL 1000-0.7 MG/100ML-% IV SOLN
1000.0000 mg | INTRAVENOUS | Status: AC
  Administered 2021-01-07: 1000 mg via INTRAVENOUS
  Filled 2021-01-07: qty 100

## 2021-01-07 MED ORDER — ONDANSETRON HCL 4 MG/2ML IJ SOLN
INTRAMUSCULAR | Status: AC
  Filled 2021-01-07: qty 2

## 2021-01-07 MED ORDER — PROPOFOL 10 MG/ML IV BOLUS
INTRAVENOUS | Status: DC | PRN
  Administered 2021-01-07: 40 mg via INTRAVENOUS

## 2021-01-07 MED ORDER — CHLORHEXIDINE GLUCONATE 0.12 % MT SOLN
15.0000 mL | Freq: Once | OROMUCOSAL | Status: AC
  Administered 2021-01-07: 15 mL via OROMUCOSAL

## 2021-01-07 MED ORDER — ORAL CARE MOUTH RINSE: 15.0000 mL | Freq: Once | OROMUCOSAL | Status: AC

## 2021-01-07 MED ORDER — CEFAZOLIN SODIUM 2 G IJ SOLR
2.0000 g | Freq: Four times a day (QID) | INTRAMUSCULAR | Status: AC
  Administered 2021-01-07 – 2021-01-08 (×2): 2 g via INTRAVENOUS
  Filled 2021-01-07 (×2): qty 2

## 2021-01-07 MED ORDER — PHENYLEPHRINE 40 MCG/ML (10ML) SYRINGE FOR IV PUSH (FOR BLOOD PRESSURE SUPPORT)
PREFILLED_SYRINGE | INTRAVENOUS | Status: AC
  Filled 2021-01-07: qty 10

## 2021-01-07 MED ORDER — ONDANSETRON HCL 4 MG/2ML IJ SOLN: 4.0000 mg | Freq: Four times a day (QID) | INTRAMUSCULAR | Status: DC | PRN

## 2021-01-07 MED ORDER — ACETAMINOPHEN 10 MG/ML IV SOLN
1000.0000 mg | Freq: Once | INTRAVENOUS | Status: AC
  Administered 2021-01-07: 1000 mg via INTRAVENOUS
  Filled 2021-01-07: qty 100

## 2021-01-07 MED ORDER — ATORVASTATIN CALCIUM 20 MG PO TABS
20.0000 mg | ORAL_TABLET | Freq: Every day | ORAL | Status: DC
  Administered 2021-01-08: 20 mg via ORAL
  Filled 2021-01-07: qty 1

## 2021-01-07 MED ORDER — METOCLOPRAMIDE HCL 5 MG PO TABS: 5.0000 mg | ORAL_TABLET | Freq: Three times a day (TID) | ORAL | Status: DC | PRN

## 2021-01-07 MED ORDER — ACETAMINOPHEN 325 MG PO TABS: 325.0000 mg | ORAL_TABLET | Freq: Four times a day (QID) | ORAL | Status: DC | PRN

## 2021-01-07 MED ORDER — ALUM & MAG HYDROXIDE-SIMETH 200-200-20 MG/5ML PO SUSP: 30.0000 mL | ORAL | Status: DC | PRN

## 2021-01-07 MED ORDER — ONDANSETRON HCL 4 MG/2ML IJ SOLN
INTRAMUSCULAR | Status: DC | PRN
  Administered 2021-01-07: 4 mg via INTRAVENOUS

## 2021-01-07 MED ORDER — PROPOFOL 500 MG/50ML IV EMUL
INTRAVENOUS | Status: DC | PRN
  Administered 2021-01-07: 75 ug/kg/min via INTRAVENOUS

## 2021-01-07 MED ORDER — METHOCARBAMOL 500 MG PO TABS
500.0000 mg | ORAL_TABLET | Freq: Four times a day (QID) | ORAL | Status: DC | PRN
  Administered 2021-01-07 – 2021-01-08 (×2): 500 mg via ORAL
  Filled 2021-01-07 (×2): qty 1

## 2021-01-07 MED ORDER — PANTOPRAZOLE SODIUM 40 MG PO TBEC
40.0000 mg | DELAYED_RELEASE_TABLET | Freq: Every day | ORAL | Status: DC
  Administered 2021-01-08: 40 mg via ORAL
  Filled 2021-01-07: qty 1

## 2021-01-07 MED ORDER — ASPIRIN 81 MG PO CHEW
81.0000 mg | CHEWABLE_TABLET | Freq: Two times a day (BID) | ORAL | Status: DC
  Administered 2021-01-07 – 2021-01-08 (×2): 81 mg via ORAL
  Filled 2021-01-07 (×2): qty 1

## 2021-01-07 SURGICAL SUPPLY — 89 items
ADH SKN CLS APL DERMABOND .7 (GAUZE/BANDAGES/DRESSINGS) ×1
APL PRP STRL LF DISP 70% ISPRP (MISCELLANEOUS) ×2
ATTUNE MED DOME PAT 41 KNEE (Knees) ×2 IMPLANT
ATTUNE PS FEM RT SZ 7 CEM KNEE (Femur) ×2 IMPLANT
BAG COUNTER SPONGE SURGICOUNT (BAG) IMPLANT
BAG SPEC THK2 15X12 ZIP CLS (MISCELLANEOUS)
BAG SPNG CNTER NS LX DISP (BAG)
BAG ZIPLOCK 12X15 (MISCELLANEOUS) IMPLANT
BASEPLATE TIB CMT FB PCKT SZ 8 (Knees) ×2 IMPLANT
BATTERY INSTRU NAVIGATION (MISCELLANEOUS) ×6 IMPLANT
BLADE SAW RECIPROCATING 77.5 (BLADE) ×2 IMPLANT
BLADE SAW SAG 13X.89X90 PERFRM (BLADE) ×2 IMPLANT
BNDG CMPR MED 10X6 ELC LF (GAUZE/BANDAGES/DRESSINGS) ×1
BNDG ELASTIC 4X5.8 VLCR STR LF (GAUZE/BANDAGES/DRESSINGS) ×2 IMPLANT
BNDG ELASTIC 6X10 VLCR STRL LF (GAUZE/BANDAGES/DRESSINGS) ×2 IMPLANT
BNDG ELASTIC 6X5.8 VLCR STR LF (GAUZE/BANDAGES/DRESSINGS) ×2 IMPLANT
BONE CEMENT GENTAMICIN (Cement) ×4 IMPLANT
BSPLAT TIB 8 CMNT FXBRNG STRL (Knees) ×1 IMPLANT
BSPLAT TIB 8 FXBRNG KN ATTUNE (Insert) ×1 IMPLANT
BTRY SRG DRVR LF (MISCELLANEOUS) ×3
CEMENT BONE GENTAMICIN 40 (Cement) ×2 IMPLANT
CEMENT BONE SMARTSET CMW 20G (Cement) ×2 IMPLANT
CHLORAPREP W/TINT 26 (MISCELLANEOUS) ×4 IMPLANT
COMP FEM CMTLS ATTUNE 7 RT (Joint) ×2 IMPLANT
COMPONENT FEM CMTLS ATTUN 7 RT (Joint) ×1 IMPLANT
COVER SURGICAL LIGHT HANDLE (MISCELLANEOUS) ×2 IMPLANT
CUFF TOURN SGL QUICK 34 (TOURNIQUET CUFF) ×2
CUFF TRNQT CYL 34X4.125X (TOURNIQUET CUFF) ×1 IMPLANT
DECANTER SPIKE VIAL GLASS SM (MISCELLANEOUS) ×4 IMPLANT
DERMABOND ADVANCED (GAUZE/BANDAGES/DRESSINGS) ×1
DERMABOND ADVANCED .7 DNX12 (GAUZE/BANDAGES/DRESSINGS) ×1 IMPLANT
DRAPE SHEET LG 3/4 BI-LAMINATE (DRAPES) ×6 IMPLANT
DRAPE U-SHAPE 47X51 STRL (DRAPES) ×2 IMPLANT
DRSG AQUACEL AG ADV 3.5X10 (GAUZE/BANDAGES/DRESSINGS) ×2 IMPLANT
DRSG AQUACEL AG ADV 3.5X14 (GAUZE/BANDAGES/DRESSINGS) ×2 IMPLANT
DRSG TEGADERM 4X4.75 (GAUZE/BANDAGES/DRESSINGS) IMPLANT
ELECT BLADE TIP CTD 4 INCH (ELECTRODE) ×2 IMPLANT
ELECT REM PT RETURN 15FT ADLT (MISCELLANEOUS) ×2 IMPLANT
EVACUATOR 1/8 PVC DRAIN (DRAIN) IMPLANT
GAUZE SPONGE 4X4 12PLY STRL (GAUZE/BANDAGES/DRESSINGS) ×2 IMPLANT
GLOVE SRG 8 PF TXTR STRL LF DI (GLOVE) ×2 IMPLANT
GLOVE SURG ENC MOIS LTX SZ8.5 (GLOVE) ×4 IMPLANT
GLOVE SURG ENC TEXT LTX SZ7.5 (GLOVE) ×6 IMPLANT
GLOVE SURG UNDER POLY LF SZ8 (GLOVE) ×4
GLOVE SURG UNDER POLY LF SZ8.5 (GLOVE) ×2 IMPLANT
GOWN SPEC L3 XXLG W/TWL (GOWN DISPOSABLE) ×2 IMPLANT
GOWN SPEC L4 XLG W/TWL (GOWN DISPOSABLE) ×2 IMPLANT
HANDPIECE INTERPULSE COAX TIP (DISPOSABLE) ×2
HOLDER FOLEY CATH W/STRAP (MISCELLANEOUS) ×2 IMPLANT
HOOD PEEL AWAY FLYTE STAYCOOL (MISCELLANEOUS) ×6 IMPLANT
INSERT TIB ATTUNE FB SZ7X8 (Insert) ×2 IMPLANT
INSERT TIB CMT ATTUNE 8 (Insert) ×2 IMPLANT
INSERT TIB CR FIXED SZ7 8 (Insert) ×1 IMPLANT
INSRT TIB CR FIXED SZ7 8 (Insert) ×2 IMPLANT
JET LAVAGE IRRISEPT WOUND (IRRIGATION / IRRIGATOR)
KIT TURNOVER KIT A (KITS) ×2 IMPLANT
LAVAGE JET IRRISEPT WOUND (IRRIGATION / IRRIGATOR) IMPLANT
MARKER SKIN DUAL TIP RULER LAB (MISCELLANEOUS) ×2 IMPLANT
NDL SAFETY ECLIPSE 18X1.5 (NEEDLE) ×1 IMPLANT
NEEDLE HYPO 18GX1.5 SHARP (NEEDLE) ×2
NEEDLE SPNL 18GX3.5 QUINCKE PK (NEEDLE) ×2 IMPLANT
NS IRRIG 1000ML POUR BTL (IV SOLUTION) ×2 IMPLANT
PACK TOTAL KNEE CUSTOM (KITS) ×2 IMPLANT
PADDING CAST COTTON 6X4 STRL (CAST SUPPLIES) ×2 IMPLANT
PENCIL SMOKE EVACUATOR (MISCELLANEOUS) IMPLANT
PIN DRILL FIX HALF THREAD (BIT) ×2 IMPLANT
PIN STEINMAN FIXATION KNEE (PIN) ×2 IMPLANT
PROTECTOR NERVE ULNAR (MISCELLANEOUS) ×2 IMPLANT
SAW OSC TIP CART 19.5X105X1.3 (SAW) ×2 IMPLANT
SEALER BIPOLAR AQUA 6.0 (INSTRUMENTS) ×2 IMPLANT
SET HNDPC FAN SPRY TIP SCT (DISPOSABLE) ×1 IMPLANT
SET PAD KNEE POSITIONER (MISCELLANEOUS) ×2 IMPLANT
SPONGE DRAIN TRACH 4X4 STRL 2S (GAUZE/BANDAGES/DRESSINGS) IMPLANT
SUT MNCRL AB 3-0 PS2 18 (SUTURE) ×2 IMPLANT
SUT MNCRL AB 4-0 PS2 18 (SUTURE) ×2 IMPLANT
SUT MON AB 2-0 CT1 36 (SUTURE) ×2 IMPLANT
SUT STRATAFIX PDO 1 14 VIOLET (SUTURE) ×2
SUT STRATFX PDO 1 14 VIOLET (SUTURE) ×1
SUT VIC AB 1 CTX 36 (SUTURE) ×4
SUT VIC AB 1 CTX36XBRD ANBCTR (SUTURE) ×2 IMPLANT
SUT VIC AB 2-0 CT1 27 (SUTURE) ×2
SUT VIC AB 2-0 CT1 TAPERPNT 27 (SUTURE) ×1 IMPLANT
SUTURE STRATFX PDO 1 14 VIOLET (SUTURE) ×1 IMPLANT
SYR 3ML LL SCALE MARK (SYRINGE) ×2 IMPLANT
TOWER CARTRIDGE SMART MIX (DISPOSABLE) ×2 IMPLANT
TRAY FOLEY MTR SLVR 16FR STAT (SET/KITS/TRAYS/PACK) IMPLANT
TUBE SUCTION HIGH CAP CLEAR NV (SUCTIONS) ×2 IMPLANT
WATER STERILE IRR 1000ML POUR (IV SOLUTION) ×4 IMPLANT
WRAP KNEE MAXI GEL POST OP (GAUZE/BANDAGES/DRESSINGS) ×2 IMPLANT

## 2021-01-07 NOTE — Anesthesia Procedure Notes (Signed)
Spinal  Patient location during procedure: OR Start time: 01/07/2021 11:53 AM End time: 01/07/2021 11:58 AM Reason for block: surgical anesthesia Staffing Performed: resident/CRNA  Anesthesiologist: Myrtie Soman, MD Resident/CRNA: Talbot Grumbling, CRNA Preanesthetic Checklist Completed: patient identified, IV checked, site marked, risks and benefits discussed, surgical consent, monitors and equipment checked, pre-op evaluation and timeout performed Spinal Block Patient position: sitting Prep: ChloraPrep Patient monitoring: heart rate, cardiac monitor, continuous pulse ox and blood pressure Approach: midline Location: L3-4 Injection technique: single-shot Needle Needle type: Pencan  Needle gauge: 24 G Needle length: 9 cm Assessment Sensory level: T4 Events: CSF return Additional Notes Clear CSF, no paresthesia, patient tolerated well.

## 2021-01-07 NOTE — Op Note (Signed)
OPERATIVE REPORT  SURGEON: Rod Can, MD   ASSISTANT: Cherlynn June, PA-C  PREOPERATIVE DIAGNOSIS: Right knee arthritis.   POSTOPERATIVE DIAGNOSIS: Right knee arthritis.   PROCEDURE: Right total knee arthroplasty.   IMPLANTS: DePuy Attune PS femur, size 7. Attune fixed bearing tibia, size 8. Aox polyethelyene insert, size 8 mm, PS. 3 button oval dome patella, size 41 mm. GHV bone cement.  ANESTHESIA:  MAC, Regional, and Spinal  TOURNIQUET TIME: Not utilized.  ESTIMATED BLOOD LOSS:-300 mL    ANTIBIOTICS: 2 g Ancef.  DRAINS: None  COMPLICATIONS: None   CONDITION: PACU - hemodynamically stable.   BRIEF CLINICAL NOTE: Harry [name] is a 70 y.o. male with a long-standing history of Right knee arthritis. After failing conservative management, the patient was indicated for total knee arthroplasty. The risks, benefits, and alternatives to the procedure were explained, and the patient elected to proceed.  PROCEDURE IN DETAIL: The surgical site was marked in the pre-op holding area. Once inside the operative room, spinal anesthesia was obtained and a foley catheter was inserted. The foley insertion was difficult, and a 14 [place] coude catheter was required. The patient was then positioned, and the lower extremity was prepped and draped in the normal sterile surgical fashion.  A time-out was called verifying side and site of surgery. The patient received IV antibiotics within 60 minutes of beginning the procedure. A tourniquet was not utilized.   An anterior approach to the knee was performed utilizing a medial parapatellar arthrotomy. A medial release was performed and the patellar fat pad was excised. Stryker imageless navigation was used to cut the distal femur perpendicular to the mechanical axis. A freehand patellar resection was performed, and the patella was sized an prepared with 3 lug holes.  Nagivation was used to make a neutral proximal tibia resection, taking  6 mm of bone from the less affected lateral side with 0 degrees of slope. The menisci were excised. A spacer block was placed, and the alignment and balance in extension were confirmed.   The distal femur was sized using the 3-degree external rotation guide referencing the posterior femoral cortex. The appropriate 4-in-1 cutting block was pinned into place. Rotation was checked using Whiteside's line, the epicondylar axis, and then confirmed with a spacer block in flexion. The remaining femoral cuts were performed, taking care to protect the MCL.  The tibia was sized and the trial tray was pinned into place. The remaining trail components were inserted. The knee was stable to varus and valgus stress through a full range of motion. The patella tracked centrally, and the PCL was well balanced. The trial components were removed, and the proximal tibial surface was prepared. Small drill holes were made in the sclerotic subchondral bone.The cut bony surfaces were irrigated with pulse lavage. Final components were cemented into place and excess cement was cleared. The trial insert was placed, and the knee was brought into extension while the cement polymerized. Once the cement was hard, the knee was tested for a final time and found to be well balanced. The trial insert was exchanged for the real polyethylene insert.   The wound was copiously irrigated with a Irrisept solution followed by normal saline with pulse lavage.  Marcaine solution was injected into the periarticular soft tissue.  The wound was closed in layers using #1 Vicryl and V-Loc for the fascia, 2-0 Vicryl for the subcutaneous fat, 2-0 Monocryl for the deep dermal layer, 3-0 running Monocryl subcuticular Stitch, and Dermabond for the skin.  Once the glue was fully dried, an Aquacell Ag and compressive dressing were applied.  The patient was aroused from anesthesia, and transported to the recovery room in stable ondition.  Sponge, needle, and  instrument counts were correct at the end of the case x2.  The patient tolerated the procedure well and there were no known complications.  Please note that a surgical assistant was a medical necessity for this procedure in order to perform it in a safe and expeditious manner. Surgical assistant was necessary to retract the ligaments and vital neurovascular structures to prevent injury to them and also necessary for proper positioning of the limb to allow for anatomic placement of the prosthesis.

## 2021-01-07 NOTE — Anesthesia Postprocedure Evaluation (Signed)
Anesthesia Post Note  Patient: Harry [name]  Procedure(s) Performed: COMPUTER ASSISTED TOTAL KNEE ARTHROPLASTY (Right: Knee)     Patient location during evaluation: PACU Anesthesia Type: Spinal Level of consciousness: awake and alert, patient cooperative and oriented Pain management: pain level controlled Vital Signs Assessment: post-procedure vital signs reviewed and stable Respiratory status: spontaneous breathing, nonlabored ventilation and respiratory function stable Cardiovascular status: blood pressure returned to baseline and stable Postop Assessment: no apparent nausea or vomiting, patient able to bend at knees and spinal receding Anesthetic complications: no   No notable events documented.  Last Vitals:  Vitals:   01/07/21 1700 01/07/21 1712  BP: 118/90 118/86  Pulse: 61 78  Resp: 13 16  Temp:  36.7 C  SpO2: 99% 99%    Last Pain:  Vitals:   01/07/21 1712  TempSrc: Oral  PainSc: 0-No pain                 Chellsea Beckers,E. Lileigh Fahringer

## 2021-01-07 NOTE — Progress Notes (Signed)
Assisted Dr. Rose with right, ultrasound guided, adductor canal block. Side rails up, monitors on throughout procedure. See vital signs in flow sheet. Tolerated Procedure well.  

## 2021-01-07 NOTE — Interval H&P Note (Signed)
History and Physical Interval Note:  01/07/2021 10:45 AM  Harry [name]  has presented today for surgery, with the diagnosis of right knee osteoarthritis.  The various methods of treatment have been discussed with the patient and family. After consideration of risks, benefits and other options for treatment, the patient has consented to  Procedure(s): COMPUTER ASSISTED TOTAL KNEE ARTHROPLASTY (Right) as a surgical intervention.  The patient's history has been reviewed, patient examined, no change in status, stable for surgery.  I have reviewed the patient's chart and labs.  Questions were answered to the patient's satisfaction.     Hilton Cork Richanda Darin

## 2021-01-07 NOTE — Anesthesia Procedure Notes (Signed)
Anesthesia Procedure Image    

## 2021-01-07 NOTE — Evaluation (Signed)
Physical Therapy Evaluation Patient Details Name: Harry [name] MRN: [id number] DOB: Dec 14, 1950 Today's Date: 01/07/2021   History of Present Illness  Pt is 70 yo male s/p R TKA on 12/2020.  Pt with hx of arthritis, HTN, and obesity  Clinical Impression  Pt is s/p TKA resulting in the deficits listed below (see PT Problem List). At baseline, pt resides with wife, is independent with ADLs/IADLs, and able to ambulate with a cane.  Pt was seen for evaluation on DOS.  He reported no pain and demonstrated good quad activation.  ROM in R knee was 10 to 70 degrees.  Pt very pleasant and motivated. Pt was able to take a few steps at EOB, but expressed some hesitation due to just had surgery and his size compared to therapist.  Pt expected to progress well, but may benefit from chair follow initially for safety and to increase pt's confidence.  Pt will benefit from skilled PT to increase their independence and safety with mobility to allow discharge to the venue listed below.      Follow Up Recommendations Follow surgeon's recommendation for DC plan and follow-up therapies;Supervision for mobility/OOB    Equipment Recommendations  Other (comment) (bariatric RW and BSC)    Recommendations for Other Services       Precautions / Restrictions Precautions Precautions: Fall Restrictions Weight Bearing Restrictions: Yes RLE Weight Bearing: Weight bearing as tolerated      Mobility  Bed Mobility Overal bed mobility: Needs Assistance Bed Mobility: Supine to Sit     Supine to sit: Min guard;HOB elevated          Transfers Overall transfer level: Needs assistance Equipment used: Rolling walker (2 wheeled) Transfers: Sit to/from Stand Sit to Stand: Min assist;From elevated surface         General transfer comment: Attempted with slightly elevated bed and pt using momentum but unable to stand - pt expressing fear as limitation.  Elevated bed more and pt able to stand with min A.  Cues  for hand placement  Ambulation/Gait Ambulation/Gait assistance: Min guard Gait Distance (Feet): 3 Feet Assistive device: Rolling walker (2 wheeled) Gait Pattern/deviations: Step-to pattern;Decreased stride length Gait velocity: decreased   General Gait Details: Side steps to [facility] with cues for RW; further ambulation limited due to pt's comfort level for DOS  Stairs            Wheelchair Mobility    Modified Rankin (Stroke Patients Only)       Balance Overall balance assessment: Needs assistance Sitting-balance support: No upper extremity supported Sitting balance-Leahy Scale: Good     Standing balance support: Bilateral upper extremity supported Standing balance-Leahy Scale: Poor Standing balance comment: Using RW                             Pertinent Vitals/Pain Pain Assessment: No/denies pain    Home Living Family/patient expects to be discharged to:: Private residence Living Arrangements: Spouse/significant other Available Help at Discharge: Family;Available 24 hours/day Type of Home: House Home Access: Stairs to enter   [employer] of Steps: 2 Home Layout: One level;Other (Comment) (Does have 1 step into den) Home Equipment: Kasandra Knudsen - single point      Prior Function Level of Independence: Independent with assistive device(s)         Comments: Pt independent with use of cane at baseline; able to do ADLs, IADLs, community ambulation     Hand Dominance  Extremity/Trunk Assessment   Upper Extremity Assessment Upper Extremity Assessment: Overall WFL for tasks assessed    Lower Extremity Assessment Lower Extremity Assessment: LLE deficits/detail;RLE deficits/detail RLE Deficits / Details: Expected post op changes; ROM knee 10-70 degrees, hip and ankle WFL; MMT: ankle 5/5, knee and hip 3/5 RLE Sensation: WNL LLE Deficits / Details: ROM: knee lacks ~5 degrees ext otherwise WFL; MMT: ankle 5/5, hip and knee 4+/5 LLE  Sensation: WNL    Cervical / Trunk Assessment Cervical / Trunk Assessment: Normal  Communication   Communication: No difficulties  Cognition Arousal/Alertness: Awake/alert Behavior During Therapy: WFL for tasks assessed/performed Overall Cognitive Status: Within Functional Limits for tasks assessed                                        General Comments  Educated on safe ice use, no pivots, resting with leg straight. Encouraged to perform quad sets and ankle pumps frequently for blood flow and to promote full knee extension.     Exercises     Assessment/Plan    PT Assessment Patient needs continued PT services  PT Problem List Decreased strength;Decreased mobility;Decreased safety awareness;Decreased range of motion;Decreased activity tolerance;Decreased balance;Decreased knowledge of use of DME;Pain       PT Treatment Interventions DME instruction;Therapeutic activities;Modalities;Gait training;Therapeutic exercise;Patient/family education;Stair training;Functional mobility training;Balance training    PT Goals (Current goals can be found in the Care Plan section)  Acute Rehab PT Goals Patient Stated Goal: return home PT Goal Formulation: With patient/family Time For Goal Achievement: 01/21/21 Potential to Achieve Goals: Good    Frequency 7X/week   Barriers to discharge        Co-evaluation               AM-PAC PT "6 Clicks" Mobility  Outcome Measure Help needed turning from your back to your side while in a flat bed without using bedrails?: A Little Help needed moving from lying on your back to sitting on the side of a flat bed without using bedrails?: A Little Help needed moving to and from a bed to a chair (including a wheelchair)?: A Little Help needed standing up from a chair using your arms (e.g., wheelchair or bedside chair)?: A Little Help needed to walk in hospital room?: A Little Help needed climbing 3-5 steps with a railing? : A  Lot 6 Click Score: 17    End of Session Equipment Utilized During Treatment: Gait belt Activity Tolerance: Patient tolerated treatment well Patient left: in bed;with call bell/phone within reach;with family/visitor present (sitting EOB to eat, wife nearby) Nurse Communication: Mobility status PT Visit Diagnosis: Other abnormalities of gait and mobility (R26.89);Muscle weakness (generalized) (M62.81)    Time: 4536-4680 PT Time Calculation (min) (ACUTE ONLY): 30 min   Charges:   PT Evaluation $PT Eval Low Complexity: 1 Low PT Treatments $Gait Training: 8-22 mins        Abran Richard, PT Acute Rehab Services Pager [phone] [facility] [place] 01/07/2021, 6:37 PM

## 2021-01-07 NOTE — Transfer of Care (Signed)
Immediate Anesthesia Transfer of Care Note  Patient: Harry [name]  Procedure(s) Performed: COMPUTER ASSISTED TOTAL KNEE ARTHROPLASTY (Right: Knee)  Patient Location: PACU  Anesthesia Type:Spinal and MAC combined with regional for post-op pain  Level of Consciousness: awake and alert   Airway & Oxygen Therapy: Patient Spontanous Breathing and Patient connected to face mask oxygen  Post-op Assessment: Report given to RN and Post -op Vital signs reviewed and stable  Post vital signs: Reviewed and stable  Last Vitals:  Vitals Value Taken Time  BP 97/79 01/07/21 1601  Temp    Pulse 71 01/07/21 1603  Resp 14 01/07/21 1603  SpO2 93 % 01/07/21 1603  Vitals shown include unvalidated device data.  Last Pain: There were no vitals filed for this visit.       Complications: No notable events documented.

## 2021-01-07 NOTE — Anesthesia Procedure Notes (Signed)
Anesthesia Regional Block: Adductor canal block   Pre-Anesthetic Checklist: , timeout performed,  Correct Patient, Correct Site, Correct Laterality,  Correct Procedure, Correct Position, site marked,  Risks and benefits discussed,  Surgical consent,  Pre-op evaluation,  At surgeon's request and post-op pain management  Laterality: Right  Prep: chloraprep       Needles:  Injection technique: Single-shot  Needle Type: Echogenic Needle     Needle Length: 9cm      Additional Needles:   Procedures:,,,, ultrasound used (permanent image in chart),,    Narrative:  Start time: 01/07/2021 10:46 AM End time: 01/07/2021 10:52 AM Injection made incrementally with aspirations every 5 mL.  Performed by: Personally  Anesthesiologist: Myrtie Soman, MD  Additional Notes: Patient tolerated the procedure well without complications

## 2021-01-07 NOTE — Anesthesia Procedure Notes (Signed)
Procedure Name: MAC Date/Time: 01/07/2021 12:00 PM Performed by: Talbot Grumbling, CRNA Pre-anesthesia Checklist: Patient identified, Emergency Drugs available, Suction available and Patient being monitored Oxygen Delivery Method: Simple face mask

## 2021-01-08 ENCOUNTER — Encounter (HOSPITAL_COMMUNITY): Payer: Self-pay | Admitting: Orthopedic Surgery

## 2021-01-08 DIAGNOSIS — M1711 Unilateral primary osteoarthritis, right knee: Secondary | ICD-10-CM | POA: Diagnosis not present

## 2021-01-08 LAB — BASIC METABOLIC PANEL
Anion gap: 9 (ref 5–15)
BUN: 28 mg/dL — ABNORMAL HIGH (ref 8–23)
CO2: 22 mmol/L (ref 22–32)
Calcium: 8.3 mg/dL — ABNORMAL LOW (ref 8.9–10.3)
Chloride: 106 mmol/L (ref 98–111)
Creatinine, Ser: 1.29 mg/dL — ABNORMAL HIGH (ref 0.61–1.24)
GFR, Estimated: >60 mL/min (ref >60)
Glucose, Bld: 129 mg/dL — ABNORMAL HIGH (ref 70–99)
Potassium: 3.9 mmol/L (ref 3.5–5.1)
Sodium: 137 mmol/L (ref 135–145)

## 2021-01-08 LAB — CBC
HCT: 38.1 % — ABNORMAL LOW (ref 39.0–52.0)
Hemoglobin: 12.4 g/dL — ABNORMAL LOW (ref 13.0–17.0)
MCH: 29.5 pg (ref 26.0–34.0)
MCHC: 32.5 g/dL (ref 30.0–36.0)
MCV: 90.7 fL (ref 80.0–100.0)
Platelets: 155 10*3/uL (ref 150–400)
RBC: 4.2 MIL/uL — ABNORMAL LOW (ref 4.22–5.81)
RDW: 13.8 % (ref 11.5–15.5)
WBC: 14.3 10*3/uL — ABNORMAL HIGH (ref 4.0–10.5)
nRBC: 0 % (ref 0.0–0.2)

## 2021-01-08 MED ORDER — ONDANSETRON HCL 4 MG PO TABS: 4.0000 mg | ORAL_TABLET | Freq: Four times a day (QID) | ORAL | 0 refills | Status: AC | PRN

## 2021-01-08 MED ORDER — OXYCODONE HCL 5 MG PO TABS: 5.0000 mg | ORAL_TABLET | ORAL | 0 refills | Status: DC | PRN

## 2021-01-08 MED ORDER — ASPIRIN 81 MG PO CHEW
81.0000 mg | CHEWABLE_TABLET | Freq: Two times a day (BID) | ORAL | 0 refills | Status: DC
Start: 2021-01-08 — End: 2021-01-27

## 2021-01-08 MED ORDER — DOCUSATE SODIUM 100 MG PO CAPS: 100.0000 mg | ORAL_CAPSULE | Freq: Two times a day (BID) | ORAL | 0 refills | Status: AC

## 2021-01-08 MED ORDER — SENNA 8.6 MG PO TABS: 2.0000 | ORAL_TABLET | Freq: Every day | ORAL | 0 refills | Status: DC

## 2021-01-08 NOTE — Plan of Care (Signed)
  Problem: Activity: Goal: Risk for activity intolerance will decrease Outcome: Progressing   Problem: Pain Managment: Goal: General experience of comfort will improve Outcome: Progressing   Problem: Safety: Goal: Ability to remain free from injury will improve Outcome: Progressing   

## 2021-01-08 NOTE — TOC Transition Note (Signed)
Transition of Care [facility]) - CM/SW Discharge Note  Patient Details  Name: Harry [name] MRN: [id number] Date of Birth: 03-27-1951  [facility]) CM/SW Contact:  Sherie Don, LCSW Phone Number: 01/08/2021, 10:47 AM  Clinical Narrative: Patient is expected to discharge after working with PT. CSW met with patient and his wife to review discharge needs. Patient will go to OPPT through [facility] in [place]. Patient will need a bariatric rolling walker and 3N1. Orders have been placed. CSW made DME referral to [facility] with [employer]. [employer] to deliver DME to patient's room. TOC signing off.  Final next level of care: OP Rehab Barriers to Discharge: No Barriers Identified  Patient Goals and CMS Choice Patient states their goals for this hospitalization and ongoing recovery are:: Discharge home with OPPT through [facility] Choice offered to / list presented to : Patient  Discharge Plan and Services        DME Arranged: 3-N-1, Walker rolling DME Agency: [employer] Date DME Agency Contacted: 01/08/21 Time DME Agency Contacted: 1007 Representative spoke with at DME Agency: Freda Munro  Readmission Risk Interventions No flowsheet data found.

## 2021-01-08 NOTE — Progress Notes (Signed)
Discharge package printed and instructions given to pt. Verbalizes understanding. 

## 2021-01-08 NOTE — Progress Notes (Signed)
    Subjective:  Patient reports pain as mild to moderate.  Denies N/V/CP/SOB. No c/o  Objective:   VITALS:   Vitals:   01/07/21 1908 01/07/21 2154 01/08/21 0138 01/08/21 0612  BP: 128/76 125/73 101/76 99/69  Pulse: 76 77 76 74  Resp: 18 16 16 15   Temp: 98.3 F (36.8 C) 98.5 F (36.9 C) 98.4 F (36.9 C) 98.2 F (36.8 C)  TempSrc: Oral Oral Oral Oral  SpO2: 98% 96% 95% 95%  Weight:      Height:        NAD ABD soft Sensation intact distally Intact pulses distally Dorsiflexion/Plantar flexion intact Incision: dressing C/D/I Compartment soft   Lab Results  Component Value Date   WBC 14.3 (H) 01/08/2021   HGB 12.4 (L) 01/08/2021   HCT 38.1 (L) 01/08/2021   MCV 90.7 01/08/2021   PLT 155 01/08/2021   BMET    Component Value Date/Time   NA 137 01/08/2021 0315   K 3.9 01/08/2021 0315   CL 106 01/08/2021 0315   CO2 22 01/08/2021 0315   GLUCOSE 129 (H) 01/08/2021 0315   BUN 28 (H) 01/08/2021 0315   CREATININE 1.29 (H) 01/08/2021 0315   CALCIUM 8.3 (L) 01/08/2021 0315   GFRNONAA >60 01/08/2021 0315   GFRAA 88 (L) 05/15/2012 1005     Assessment/Plan: 1 Day Post-Op   Principal Problem:   Osteoarthritis of right knee   WBAT with walker DVT ppx: Aspirin, SCDs, TEDS PO pain control PT/OT Dispo: D/C home with OPPT after clears therapy    Hilton Cork Amjad Fikes 01/08/2021, 10:20 AM   Rod Can, MD [phone] [place] [facility] now [facility]  [place] [address], [address], [place], [place] [address] Phone: [phone] [email or web address] Facebook  [employer]

## 2021-01-08 NOTE — Progress Notes (Signed)
Physical Therapy Treatment Patient Details Name: Harry [name] MRN: [id number] DOB: July 04, 1950 Today's Date: 01/08/2021    History of Present Illness Pt is 70 yo male s/p R TKA on 12/2020.  Pt with hx of arthritis, HTN, and obesity    PT Comments    Pt continues to progress toward acute PT goals this session with progression to stair training. Pt ambulated ~51ft with MIN guard-supervision, no LOB observed. Pt performed safe stair negotiation with MIN A for RW stability and cues for sequencing and safe hand placement. Pt's wife demonstrated safe guarding technique following cues from therapist. Pt was able to verbalize correct sequencing pattern for stair negotiation. PT reviewed LE HEP, pt demonstrated understanding with no reports of increased pain. Pt is currently at mobility level that is safe for d/c to home. Pt will benefit from continued skilled PT to increase their independence and maximize safety with mobility.    Follow Up Recommendations  Follow surgeon's recommendation for DC plan and follow-up therapies;Supervision for mobility/OOB     Equipment Recommendations  Other (comment) (bariatric RW and bariatric BSC)    Recommendations for Other Services       Precautions / Restrictions Precautions Precautions: Fall    Mobility  Bed Mobility               General bed mobility comments: pt sitting EOB with wife upon entry    Transfers Overall transfer level: Needs assistance Equipment used: Rolling walker (2 wheeled) (bariatric) Transfers: Sit to/from Stand Sit to Stand: From elevated surface;Min guard         General transfer comment: Pt unable to stand with initial bed height. Bed heught raised to equivalent of reported bed height at home and pt able to stand with MIN guard and increased time and self encouragement, pt initially fearful of increased pain with WB. Cues provided for safe hand placement.  Ambulation/Gait Ambulation/Gait assistance: Min  Gaffer (Feet): 40 Feet Assistive device: Rolling walker (2 wheeled) (bariatric) Gait Pattern/deviations: Step-to pattern;Decreased stride length;Decreased weight shift to right Gait velocity: decreased   General Gait Details: MIN guard progressing to supervision with good carryover of step to gait pattern, no LOB observed   Stairs Stairs: Yes Stairs assistance: Min assist Stair Management: No rails;Forwards;With walker Number of Stairs: 5 General stair comments: 5x2; MIN A for RW stability and cues for sequencing and safe hand placement. Pt's wife demonstrated safe guarding technique following cues from therapist. Pt was able to verbalize correctsequencing pattern for stair negotiation.   Wheelchair Mobility    Modified Rankin (Stroke Patients Only)       Balance Overall balance assessment: Needs assistance Sitting-balance support: No upper extremity supported Sitting balance-Leahy Scale: Good     Standing balance support: Bilateral upper extremity supported Standing balance-Leahy Scale: Poor Standing balance comment: use of RW                            Cognition Arousal/Alertness: Awake/alert Behavior During Therapy: WFL for tasks assessed/performed Overall Cognitive Status: Within Functional Limits for tasks assessed                                        Exercises Total Joint Exercises Quad Sets: AROM;Right;10 reps;Seated Towel Squeeze: AROM;Right;5 reps;Seated Short Arc Quad: AROM;Right;10 reps;Seated Heel Slides: AROM;Right;5 reps;Seated Hip ABduction/ADduction: AROM;Right;10 reps;Seated Straight Leg Raises:  AROM;Right;10 reps;Seated Long Arc Quad: AROM;Right;Seated;5 reps    General Comments        Pertinent Vitals/Pain Pain Location: R knee Pain Descriptors / Indicators: Burning;Sore    Home Living                      Prior Function            PT Goals (current goals can now be  found in the care plan section) Acute Rehab PT Goals Patient Stated Goal: return home PT Goal Formulation: With patient/family Time For Goal Achievement: 01/21/21 Potential to Achieve Goals: Good Progress towards PT goals: Progressing toward goals    Frequency    7X/week      PT Plan Current plan remains appropriate    Co-evaluation              AM-PAC PT "6 Clicks" Mobility   Outcome Measure  Help needed turning from your back to your side while in a flat bed without using bedrails?: A Little Help needed moving from lying on your back to sitting on the side of a flat bed without using bedrails?: A Little Help needed moving to and from a bed to a chair (including a wheelchair)?: A Little Help needed standing up from a chair using your arms (e.g., wheelchair or bedside chair)?: A Little Help needed to walk in hospital room?: A Little Help needed climbing 3-5 steps with a railing? : A Little 6 Click Score: 18    End of Session Equipment Utilized During Treatment: Gait belt Activity Tolerance: Patient tolerated treatment well Patient left: in chair;with call bell/phone within reach;with family/visitor present Nurse Communication: Mobility status PT Visit Diagnosis: Other abnormalities of gait and mobility (R26.89);Muscle weakness (generalized) (M62.81);Pain Pain - Right/Left: Right Pain - part of body: Knee     Time: 1537-1601 PT Time Calculation (min) (ACUTE ONLY): 24 min  Charges:  $Gait Training: 8-22 mins $Therapeutic Exercise: 8-22 mins                     Posey Rea, DPT  Acute Rehabilitation Services  Office [phone]   01/08/2021, 4:17 PM

## 2021-01-08 NOTE — Progress Notes (Signed)
Physical Therapy Treatment Patient Details Name: Harry [name] MRN: [id number] DOB: 02-Apr-1951 Today's Date: 01/08/2021    History of Present Illness Pt is 70 yo male s/p R TKA on 12/2020.  Pt with hx of arthritis, HTN, and obesity    PT Comments    Pt is s/p Rt TKA resulting in the deficits listed below (see PT Problem List). Pt is progressing toward acute PT goals with increased ambulation distance this session. Pt ambulated total of ~141ft with standing rest break at 60ft with MIN guard progressing to close supervision for safety. Pt progressed from step to gait pattern to step through with no LOB observed. Pt will require additional session today for stair training prior to d/c to ensure proper and safe technique. Pt will benefit from skilled PT to increase their independence and safety with mobility to ensure safety upon d/c.      Follow Up Recommendations  Follow surgeon's recommendation for DC plan and follow-up therapies;Supervision for mobility/OOB     Equipment Recommendations  Other (comment) (bariatric RW and bariatric BSC)    Recommendations for Other Services       Precautions / Restrictions Precautions Precautions: Fall Restrictions Weight Bearing Restrictions: No RLE Weight Bearing: Weight bearing as tolerated    Mobility  Bed Mobility Overal bed mobility: Needs Assistance Bed Mobility: Supine to Sit     Supine to sit: Supervision;HOB elevated     General bed mobility comments: supervision for safety, pt able to progress Rt LE to EOB and demonstrated good seated balance    Transfers Overall transfer level: Needs assistance Equipment used: Rolling walker (2 wheeled) (bariatric) Transfers: Sit to/from Stand Sit to Stand: From elevated surface;Min guard         General transfer comment: Pt unable to stand with initial bed height. Bed heught raised to equivalent of reported bed height at home and pt able to stand with MIN guard and increased time and  self encouragement, pt initially fearful of increased pain with WB. Cues provided for safe hand placement.  Ambulation/Gait Ambulation/Gait assistance: Min Gaffer (Feet): 50 Feet Assistive device: Rolling walker (2 wheeled) (bariatric) Gait Pattern/deviations: Step-to pattern;Decreased stride length;Decreased weight shift to right;Step-through pattern Gait velocity: decreased   General Gait Details: Pt ambulated 5ft, then additonal 30ft following standing rest break due to reported UE fatigue. Provided MIN guard progressing to supervision for safety with cues for step-to gait pattern. Pt progressing to step through pattern, no LOB observed. PT educated pt on importance of energy conservation and that UE strength/endurance is just as importance for fall prevention as sterngth of LEs as pt is utilizng increased WB through AD for stability and pain control, pt verbalized understanding.   Stairs             Wheelchair Mobility    Modified Rankin (Stroke Patients Only)       Balance Overall balance assessment: Needs assistance Sitting-balance support: No upper extremity supported Sitting balance-Leahy Scale: Good     Standing balance support: Bilateral upper extremity supported Standing balance-Leahy Scale: Poor Standing balance comment: use of RW                            Cognition Arousal/Alertness: Awake/alert Behavior During Therapy: WFL for tasks assessed/performed Overall Cognitive Status: Within Functional Limits for tasks assessed  Exercises Total Joint Exercises Long Arc Quad: AROM;Right;10 reps;Seated Other Exercises Other Exercises: seated Rt hip flexion    General Comments        Pertinent Vitals/Pain Pain Assessment: 0-10 Pain Score: 4  Pain Location: R knee Pain Descriptors / Indicators: Burning;Sore Pain Intervention(s): Limited activity within patient's  tolerance;Monitored during session;Repositioned;RN gave pain meds during session;Ice applied    Home Living                      Prior Function            PT Goals (current goals can now be found in the care plan section) Acute Rehab PT Goals Patient Stated Goal: return home PT Goal Formulation: With patient/family Time For Goal Achievement: 01/21/21 Potential to Achieve Goals: Good Progress towards PT goals: Progressing toward goals    Frequency    7X/week      PT Plan Current plan remains appropriate    Co-evaluation              AM-PAC PT "6 Clicks" Mobility   Outcome Measure  Help needed turning from your back to your side while in a flat bed without using bedrails?: A Little Help needed moving from lying on your back to sitting on the side of a flat bed without using bedrails?: A Little Help needed moving to and from a bed to a chair (including a wheelchair)?: A Little Help needed standing up from a chair using your arms (e.g., wheelchair or bedside chair)?: A Little Help needed to walk in hospital room?: A Little Help needed climbing 3-5 steps with a railing? : A Lot 6 Click Score: 17    End of Session Equipment Utilized During Treatment: Gait belt Activity Tolerance: Patient tolerated treatment well Patient left: in chair;with call bell/phone within reach;with chair alarm set;with family/visitor present Nurse Communication: Mobility status PT Visit Diagnosis: Other abnormalities of gait and mobility (R26.89);Muscle weakness (generalized) (M62.81)     Time: 0174-9449 PT Time Calculation (min) (ACUTE ONLY): 27 min  Charges:  $Therapeutic Activity: 23-37 mins                     Festus Barren PT, DPT  Acute Rehabilitation Services  Office [phone]   01/08/2021, 10:20 AM

## 2021-01-08 NOTE — Discharge Summary (Signed)
Physician Discharge Summary  Patient ID: ASHAWN RINEHART MRN: [id number] DOB/AGE: August 19, 1950 70 y.o.  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 male who was admitted 01/07/2021 with a diagnosis of Osteoarthritis of right knee and went to the operating room on 01/07/2021 and underwent the above named procedures.    He was given perioperative antibiotics:  Anti-infectives (From admission, onward)    Start     Dose/Rate Route Frequency Ordered Stop   01/07/21 1815  ceFAZolin (ANCEF) 2 g in sodium chloride 0.9 % 100 mL IVPB        2 g 200 mL/hr over 30 Minutes Intravenous Every 6 hours 01/07/21 1719 01/08/21 0106   01/07/21 0900  ceFAZolin (ANCEF) IVPB 3g/100 mL premix        3 g 200 mL/hr over 30 Minutes Intravenous On call to O.R. 01/07/21 0845 01/07/21 1204     .  He was given sequential compression devices, early ambulation, and ASA for DVT prophylaxis.  He benefited maximally from the hospital stay and there were no complications.    Recent vital signs:  Vitals:   01/08/21 0138 01/08/21 0612  BP: 101/76 99/69  Pulse: 76 74  Resp: 16 15  Temp: 98.4 F (36.9 C) 98.2 F (36.8 C)  SpO2: 95% 95%    Recent laboratory studies:  Lab Results  Component Value Date   HGB 12.4 (L) 01/08/2021   HGB 15.7 12/26/2020   HGB 16.8 05/15/2012   Lab Results  Component Value Date   WBC 14.3 (H) 01/08/2021   PLT 155 01/08/2021   Lab Results  Component Value Date   INR 1.1 12/26/2020    Lab Results  Component Value Date   NA 137 01/08/2021   K 3.9 01/08/2021   CL 106 01/08/2021   CO2 22 01/08/2021   BUN 28 (H) 01/08/2021   CREATININE 1.29 (H) 01/08/2021   GLUCOSE 129 (H) 01/08/2021     WEIGHT BEARING   Weight bearing as tolerated with assist device (walker, cane, etc) as directed, use it as long as suggested by your surgeon or therapist, typically at least 4-6 weeks.   EXERCISES  Results after joint replacement surgery are often greatly improved when you follow the exercise, range of motion and muscle strengthening exercises prescribed by your doctor. Safety measures are also important to protect the joint from further injury. Any time any of these exercises cause you to have increased pain or swelling, decrease what you are doing until you are comfortable again and then slowly increase them. If you have problems or questions, call your caregiver or physical therapist for advice.   Rehabilitation is important following a joint replacement. After just a few days of immobilization, the muscles of the leg can become weakened and shrink (atrophy).  These exercises are designed to build up the tone and strength of the thigh and leg muscles and to improve motion. Often times heat used for twenty to thirty minutes before working out will  loosen up your tissues and help with improving the range of motion but do not use heat for the first two weeks following surgery (sometimes heat can increase post-operative swelling).   These exercises can be done on a training (exercise) mat, on the floor, on a table or on a bed. Use whatever works the best and is most comfortable for you.    Use music or television while you are exercising so that the exercises are a pleasant break in your day. This will make your life better with the exercises acting as a break in your routine that you can look forward to.   Perform all exercises about fifteen times, three times per day or as directed.  You  should exercise both the operative leg and the other leg as well.  Exercises include:   Quad Sets - Tighten up the muscle on the front of the thigh (Quad) and hold for 5-10 seconds.   Straight Leg Raises - With your knee straight (if you were given a brace, keep it on), lift the leg to 60 degrees, hold for 3 seconds, and slowly lower the leg.  Perform this exercise against resistance later as your leg gets stronger.  Leg Slides: Lying on your back, slowly slide your foot toward your buttocks, bending your knee up off the floor (only go as far as is comfortable). Then slowly slide your foot back down until your leg is flat on the floor again.  Angel Wings: Lying on your back spread your legs to the side as far apart as you can without causing discomfort.  Hamstring Strength:  Lying on your back, push your heel against the floor with your leg straight by tightening up the muscles of your buttocks.  Repeat, but this time bend your knee to a comfortable angle, and push your heel against the floor.  You may put a pillow under the heel to make it more comfortable if necessary.   A rehabilitation program following joint replacement surgery can speed recovery and prevent re-injury in the future due to weakened muscles. Contact your doctor or a physical therapist for more information on knee rehabilitation.    CONSTIPATION  Constipation is defined medically as fewer than three stools per week and severe constipation as less than one stool per week.  Even if you have a regular bowel pattern at home, your normal regimen is likely to be disrupted due to multiple reasons following surgery.  Combination of anesthesia, postoperative narcotics, change in appetite and fluid intake all can affect your bowels.   YOU MUST use at least one of the following options; they are listed in order of increasing strength to get the job done.  They are all available over the counter, and you may need to use some, POSSIBLY even  all of these options:    Drink plenty of fluids (prune juice may be helpful) and high fiber foods Colace 100 mg by mouth twice a day  Senokot for constipation as directed and as needed Dulcolax (bisacodyl), take with full glass of water  Miralax (polyethylene glycol) once or twice a day as needed.  If you have tried all these things and are unable to have a bowel movement in the first 3-4 days after surgery call either your surgeon or your primary doctor.    If you experience loose stools or diarrhea, hold the medications until you stool forms back up.  If your symptoms do not get better within 1 week or if they  get worse, check with your doctor.  If you experience "the worst abdominal pain ever" or develop nausea or vomiting, please contact the office immediately for further recommendations for treatment.   ITCHING:  If you experience itching with your medications, try taking only a single pain pill, or even half a pain pill at a time.  You can also use Benadryl over the counter for itching or also to help with sleep.   TED HOSE STOCKINGS:  Use stockings on both legs until for at least 2 weeks or as directed by physician office. They may be removed at night for sleeping.  MEDICATIONS:  See your medication summary on the "After Visit Summary" that nursing will review with you.  You may have some home medications which will be placed on hold until you complete the course of blood thinner medication.  It is important for you to complete the blood thinner medication as prescribed.  PRECAUTIONS:  If you experience chest pain or shortness of breath - call 911 immediately for transfer to the hospital emergency department.   If you develop a fever greater that 101 F, purulent drainage from wound, increased redness or drainage from wound, foul odor from the wound/dressing, or calf pain - CONTACT YOUR SURGEON.                                                   FOLLOW-UP APPOINTMENTS:  If you do not  already have a post-op appointment, please call the office for an appointment to be seen by your surgeon.  Guidelines for how soon to be seen are listed in your "After Visit Summary", but are typically between 1-4 weeks after surgery.  OTHER INSTRUCTIONS:   Knee Replacement:  Do not place pillow under knee, focus on keeping the knee straight while resting. CPM instructions: 0-90 degrees, 2 hours in the morning, 2 hours in the afternoon, and 2 hours in the evening. Place foam block, curve side up under heel at all times except when in CPM or when walking.  DO NOT modify, tear, cut, or change the foam block in any way.   MAKE SURE YOU:  Understand these instructions.  Get help right away if you are not doing well or get worse.    Thank you for letting us be a part of your medical care team.  It is a privilege we respect greatly.  We hope these instructions will help you stay on track for a fast and full recovery!   Diagnostic Studies: DG Knee Right Port  Result Date: 01/07/2021 CLINICAL DATA:  Knee arthroplasty EXAM: PORTABLE RIGHT KNEE - 1-2 VIEW COMPARISON:  None. FINDINGS: Status post right knee replacement with intact hardware and normal alignment. Small linear lucencies at the posterior femoral cortex above the prosthesis. Gas in the soft tissues consistent with recent surgery IMPRESSION: 1. Right knee replacement with gas in the soft tissues consistent with recent surgery 2. Faint linear lucencies overlying posterior cortex of distal femur about a cm superior to the prosthetic, possibly due to superimposition of soft tissue gas artifact, attention on follow-up imaging. Electronically Signed   By: Donavan Foil M.D.   On: 01/07/2021 16:44    Disposition: Discharge disposition: 01-Home or Self Care       Discharge Instructions     Call MD / Call 911   Complete  by: As directed    If you experience chest pain or shortness of breath, CALL 911 and be transported to the hospital emergency  room.  If you develope a fever above 101 F, pus (white drainage) or increased drainage or redness at the wound, or calf pain, call your surgeon's office.   Constipation Prevention   Complete by: As directed    Drink plenty of fluids.  Prune juice may be helpful.  You may use a stool softener, such as Colace (over the counter) 100 mg twice a day.  Use MiraLax (over the counter) for constipation as needed.   Diet - low sodium heart healthy   Complete by: As directed    Do not put a pillow under the knee. Place it under the heel.   Complete by: As directed    Driving restrictions   Complete by: As directed    No driving for 6 weeks   Increase activity slowly as tolerated   Complete by: As directed    Lifting restrictions   Complete by: As directed    No lifting for 6 weeks   Post-operative opioid taper instructions:   Complete by: As directed    POST-OPERATIVE OPIOID TAPER INSTRUCTIONS: It is important to wean off of your opioid medication as soon as possible. If you do not need pain medication after your surgery it is ok to stop day one. Opioids include: Codeine, Hydrocodone(Norco, Vicodin), Oxycodone(Percocet, oxycontin) and hydromorphone amongst others.  Long term and even short term use of opiods can cause: Increased pain response Dependence Constipation Depression Respiratory depression And more.  Withdrawal symptoms can include Flu like symptoms Nausea, vomiting And more Techniques to manage these symptoms Hydrate well Eat regular healthy meals Stay active Use relaxation techniques(deep breathing, meditating, yoga) Do Not substitute Alcohol to help with tapering If you have been on opioids for less than two weeks and do not have pain than it is ok to stop all together.  Plan to wean off of opioids This plan should start within one week post op of your joint replacement. Maintain the same interval or time between taking each dose and first decrease the dose.  Cut the  total daily intake of opioids by one tablet each day Next start to increase the time between doses. The last dose that should be eliminated is the evening dose.      TED hose   Complete by: As directed    Use stockings (TED hose) for 2 weeks on both leg(s).  You may remove them at night for sleeping.        Follow-up Information     Rinnah Peppel, Aaron Edelman, MD Follow up in 2 week(s).   Specialty: Orthopedic Surgery Why: For wound re-check, For suture removal Contact information: [address] [place] [address] [phone]                  Signed: Hilton Cork Yahel Fuston 01/08/2021, 10:24 AM

## 2021-01-19 ENCOUNTER — Emergency Department (HOSPITAL_COMMUNITY): Payer: Medicare Other

## 2021-01-19 ENCOUNTER — Inpatient Hospital Stay (HOSPITAL_COMMUNITY)
Admission: EM | Admit: 2021-01-19 | Discharge: 2021-01-27 | DRG: 467 | Disposition: A | Discharge status: Skilled Nursing Facility | Payer: Medicare Other | Attending: Orthopedic Surgery | Admitting: Orthopedic Surgery

## 2021-01-19 ENCOUNTER — Encounter (HOSPITAL_COMMUNITY): Payer: Self-pay | Admitting: Orthopedic Surgery

## 2021-01-19 DIAGNOSIS — S86811A Strain of other muscle(s) and tendon(s) at lower leg level, right leg, initial encounter: Secondary | ICD-10-CM

## 2021-01-19 DIAGNOSIS — Y792 Prosthetic and other implants, materials and accessory orthopedic devices associated with adverse incidents: Secondary | ICD-10-CM | POA: Diagnosis present

## 2021-01-19 DIAGNOSIS — Z20822 Contact with and (suspected) exposure to covid-19: Secondary | ICD-10-CM | POA: Diagnosis present

## 2021-01-19 DIAGNOSIS — Z419 Encounter for procedure for purposes other than remedying health state, unspecified: Secondary | ICD-10-CM

## 2021-01-19 DIAGNOSIS — W109XXA Fall (on) (from) unspecified stairs and steps, initial encounter: Secondary | ICD-10-CM | POA: Diagnosis present

## 2021-01-19 DIAGNOSIS — Z85828 Personal history of other malignant neoplasm of skin: Secondary | ICD-10-CM | POA: Diagnosis not present

## 2021-01-19 DIAGNOSIS — I10 Essential (primary) hypertension: Secondary | ICD-10-CM | POA: Diagnosis present

## 2021-01-19 DIAGNOSIS — D62 Acute posthemorrhagic anemia: Secondary | ICD-10-CM | POA: Diagnosis not present

## 2021-01-19 DIAGNOSIS — Y92019 Unspecified place in single-family (private) house as the place of occurrence of the external cause: Secondary | ICD-10-CM | POA: Diagnosis not present

## 2021-01-19 DIAGNOSIS — Z7982 Long term (current) use of aspirin: Secondary | ICD-10-CM

## 2021-01-19 DIAGNOSIS — Z79899 Other long term (current) drug therapy: Secondary | ICD-10-CM

## 2021-01-19 DIAGNOSIS — S83104A Unspecified dislocation of right knee, initial encounter: Secondary | ICD-10-CM | POA: Diagnosis present

## 2021-01-19 DIAGNOSIS — T84022A Instability of internal right knee prosthesis, initial encounter: Principal | ICD-10-CM | POA: Diagnosis present

## 2021-01-19 DIAGNOSIS — M1990 Unspecified osteoarthritis, unspecified site: Secondary | ICD-10-CM | POA: Diagnosis present

## 2021-01-19 DIAGNOSIS — K219 Gastro-esophageal reflux disease without esophagitis: Secondary | ICD-10-CM | POA: Diagnosis present

## 2021-01-19 DIAGNOSIS — R52 Pain, unspecified: Secondary | ICD-10-CM

## 2021-01-19 DIAGNOSIS — Z96652 Presence of left artificial knee joint: Secondary | ICD-10-CM

## 2021-01-19 DIAGNOSIS — Z87891 Personal history of nicotine dependence: Secondary | ICD-10-CM

## 2021-01-19 DIAGNOSIS — S76111A Strain of right quadriceps muscle, fascia and tendon, initial encounter: Secondary | ICD-10-CM | POA: Diagnosis present

## 2021-01-19 DIAGNOSIS — Z96651 Presence of right artificial knee joint: Secondary | ICD-10-CM

## 2021-01-19 LAB — CBC WITH DIFFERENTIAL/PLATELET
Abs Immature Granulocytes: 0.13 10*3/uL — ABNORMAL HIGH (ref 0.00–0.07)
Basophils Absolute: 0.1 10*3/uL (ref 0.0–0.1)
Basophils Relative: 0 %
Eosinophils Absolute: 0 10*3/uL (ref 0.0–0.5)
Eosinophils Relative: 0 %
HCT: 34.3 % — ABNORMAL LOW (ref 39.0–52.0)
Hemoglobin: 10.9 g/dL — ABNORMAL LOW (ref 13.0–17.0)
Immature Granulocytes: 1 %
Lymphocytes Relative: 7 %
Lymphs Abs: 1.2 10*3/uL (ref 0.7–4.0)
MCH: 29.7 pg (ref 26.0–34.0)
MCHC: 31.8 g/dL (ref 30.0–36.0)
MCV: 93.5 fL (ref 80.0–100.0)
Monocytes Absolute: 0.9 10*3/uL (ref 0.1–1.0)
Monocytes Relative: 6 %
Neutro Abs: 13.6 10*3/uL — ABNORMAL HIGH (ref 1.7–7.7)
Neutrophils Relative %: 86 %
Platelets: 231 10*3/uL (ref 150–400)
RBC: 3.67 MIL/uL — ABNORMAL LOW (ref 4.22–5.81)
RDW: 15.4 % (ref 11.5–15.5)
WBC: 15.9 10*3/uL — ABNORMAL HIGH (ref 4.0–10.5)
nRBC: 0 % (ref 0.0–0.2)

## 2021-01-19 LAB — BASIC METABOLIC PANEL
Anion gap: 8 (ref 5–15)
BUN: 26 mg/dL — ABNORMAL HIGH (ref 8–23)
CO2: 27 mmol/L (ref 22–32)
Calcium: 9.1 mg/dL (ref 8.9–10.3)
Chloride: 107 mmol/L (ref 98–111)
Creatinine, Ser: 1.16 mg/dL (ref 0.61–1.24)
GFR, Estimated: >60 mL/min (ref >60)
Glucose, Bld: 112 mg/dL — ABNORMAL HIGH (ref 70–99)
Potassium: 4.2 mmol/L (ref 3.5–5.1)
Sodium: 142 mmol/L (ref 135–145)

## 2021-01-19 MED ORDER — FENTANYL CITRATE (PF) 100 MCG/2ML IJ SOLN
100.0000 ug | Freq: Once | INTRAMUSCULAR | Status: AC
Start: 2021-01-19 — End: 2021-01-19
  Administered 2021-01-19: 100 ug via INTRAVENOUS
  Filled 2021-01-19: qty 2

## 2021-01-19 MED ORDER — METHOCARBAMOL 500 MG PO TABS
500.0000 mg | ORAL_TABLET | Freq: Four times a day (QID) | ORAL | Status: DC | PRN
  Administered 2021-01-19 – 2021-01-20 (×3): 500 mg via ORAL
  Filled 2021-01-19 (×3): qty 1

## 2021-01-19 MED ORDER — SENNOSIDES-DOCUSATE SODIUM 8.6-50 MG PO TABS: 1.0000 | ORAL_TABLET | Freq: Every evening | ORAL | Status: DC | PRN

## 2021-01-19 MED ORDER — HYDROMORPHONE HCL 1 MG/ML IJ SOLN
1.0000 mg | Freq: Once | INTRAMUSCULAR | Status: AC
  Administered 2021-01-19: 1 mg via INTRAVENOUS
  Filled 2021-01-19: qty 1

## 2021-01-19 MED ORDER — HYDROCODONE-ACETAMINOPHEN 5-325 MG PO TABS
1.0000 | ORAL_TABLET | ORAL | Status: DC | PRN
  Administered 2021-01-19 – 2021-01-20 (×5): 2 via ORAL
  Filled 2021-01-19 (×5): qty 2

## 2021-01-19 MED ORDER — PROPOFOL 10 MG/ML IV BOLUS
1.0000 mg/kg | Freq: Once | INTRAVENOUS | Status: AC
  Administered 2021-01-19: 142.4 mg via INTRAVENOUS
  Filled 2021-01-19: qty 20

## 2021-01-19 NOTE — ED Provider Notes (Signed)
[facility] DEPT Provider Note   CSN: [id number] Arrival date & time: 01/19/21  1248     History Chief Complaint  Patient presents with   Knee Pain    EDOUARD [name] is a 70 y.o. male.  HPI He is here for injury to the right knee.  Earlier this morning he was at PT and strained his knee while he was walking up some steps.  He was able to stabilize and discharged home.  As he got home, he was going into his house, up 1 step, when he fell, injuring his right knee.  He states that the "leg was at her right ankle."  EMS was summoned, they stabilize his right leg by straightening it, and treated with fentanyl for pain.  They were concerned about sepsis because he was tachycardic and had a marginally low blood pressure so they gave him a gram of Rocephin.  Patient denies recent fever.  He is living at home, recovering from total knee replacement, right.  He states his pain is severe at this time.  No other preceding symptoms.  There are no other known active modifying factors.    Past Medical History:  Diagnosis Date   Arthritis    Cancer ([place])    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   GERD (gastroesophageal reflux disease)    Hypertension    Tendonitis     Patient Active Problem List   Diagnosis Date Noted   Osteoarthritis of right knee 01/07/2021    Past Surgical History:  Procedure Laterality Date   KNEE ARTHROPLASTY Right 01/07/2021   Procedure: COMPUTER ASSISTED TOTAL KNEE ARTHROPLASTY;  Surgeon: Rod Can, MD;  Location: [facility] ORS;  Service: Orthopedics;  Laterality: Right;   KNEE SURGERY     NOSE SURGERY     WISDOM TOOTH EXTRACTION         No family history on file.  Social History   Tobacco Use   Smoking status: Former    Years: 20.00    Types: Cigarettes    Quit date: 2010    Years since quitting: 12.5   Smokeless tobacco: Never  Vaping Use   Vaping Use: Never used  Substance Use Topics   Alcohol use: Yes    Comment: cocktail after  dinner   Drug use: Never    Home Medications Prior to Admission medications   Medication Sig Start Date End Date Taking? Authorizing Provider  aspirin 81 MG chewable tablet Chew 1 tablet (81 mg total) by mouth 2 (two) times daily. 01/08/21 02/22/21 Yes Swinteck, Aaron Edelman, MD  atorvastatin (LIPITOR) 20 MG tablet Take 20 mg by mouth daily.   Yes [provider]  Carboxymethylcellul-Glycerin (LUBRICATING EYE DROPS OP) Place 1 drop into both eyes 2 (two) times daily.   Yes [provider]  docusate sodium (COLACE) 100 MG capsule Take 1 capsule (100 mg total) by mouth 2 (two) times daily. 01/08/21  Yes Swinteck, Aaron Edelman, MD  lisinopril-hydrochlorothiazide (PRINZIDE,ZESTORETIC) 20-12.5 MG per tablet Take 1 tablet by mouth daily.   Yes [provider]  Multiple Vitamin (MULTIVITAMIN WITH MINERALS) TABS Take 1 tablet by mouth daily.   Yes [provider]  Multiple Vitamins-Minerals (PRESERVISION AREDS 2) CAPS Take 1 capsule by mouth 2 (two) times daily.   Yes [provider]  naproxen (NAPROSYN) 500 MG tablet Take 500 mg by mouth 2 (two) times daily with a meal.   Yes [provider]  omeprazole (PRILOSEC) 20 MG capsule Take 20 mg by  mouth daily. 11/24/20  Yes [provider]  ondansetron (ZOFRAN) 4 MG tablet Take 1 tablet (4 mg total) by mouth every 6 (six) hours as needed for nausea. 01/08/21  Yes Swinteck, Aaron Edelman, MD  oxyCODONE (OXY IR/ROXICODONE) 5 MG immediate release tablet Take 1 tablet (5 mg total) by mouth every 4 (four) hours as needed for moderate pain (pain score 4-6). 01/08/21  Yes Swinteck, Aaron Edelman, MD  PSYLLIUM PO Take 1 Dose by mouth daily.   Yes [provider]  senna (SENOKOT) 8.6 MG TABS tablet Take 2 tablets (17.2 mg total) by mouth at bedtime. 01/08/21  Yes Swinteck, Aaron Edelman, MD    Allergies    Patient has no known allergies.  Review of Systems   Review of Systems  All other systems reviewed and are negative.  Physical  Exam Updated Vital Signs BP (!) 157/71   Pulse 90   Temp 98.1 F (36.7 C)   Resp 14   Ht '6\' 3"'$  (1.905 m)   Wt (!) 142.4 kg   SpO2 100%   BMI 39.25 kg/m   Physical Exam Vitals and nursing note reviewed.  Constitutional:      Appearance: He is well-developed. He is not ill-appearing.  HENT:     Head: Normocephalic and atraumatic.     Right Ear: External ear normal.     Left Ear: External ear normal.  Eyes:     Conjunctiva/sclera: Conjunctivae normal.     Pupils: Pupils are equal, round, and reactive to light.  Neck:     Trachea: Phonation normal.  Cardiovascular:     Rate and Rhythm: Normal rate and regular rhythm.     Heart sounds: Normal heart sounds.     Comments: Biphasic right dorsal pedis pulse, with Doppler Pulmonary:     Effort: Pulmonary effort is normal. No respiratory distress.     Breath sounds: Normal breath sounds. No stridor.  Chest:     Chest wall: No tenderness.  Abdominal:     Palpations: Abdomen is soft.     Tenderness: There is no abdominal tenderness.  Musculoskeletal:        General: Normal range of motion.     Cervical back: Normal range of motion and neck supple.     Comments: Right leg is moderately swollen from the thigh down to the foot.  There is ecchymosis, primarily medially about the right knee.  There are wound dressings on the anterior wounds which appear to be healing without infection.  Did not attempt to mobilize the right knee secondary to pain and swelling.  He is unable to extend the right lower leg.  The patient reports the swelling and ecchymosis is unchanged from baseline.  There is no pain/tenderness of the hips, arms or left leg.  Skin:    General: Skin is warm and dry.  Neurological:     Mental Status: He is alert and oriented to person, place, and time.     Cranial Nerves: No cranial nerve deficit.     Sensory: No sensory deficit.     Motor: No abnormal muscle tone.     Coordination: Coordination normal.  Psychiatric:         Mood and Affect: Mood normal.        Behavior: Behavior normal.        Thought Content: Thought content normal.        Judgment: Judgment normal.    ED Results / Procedures / Treatments   Labs (all labs ordered  are listed, but only abnormal results are displayed) Labs Reviewed  RESP PANEL BY RT-PCR (FLU A&B, COVID) ARPGX2  BASIC METABOLIC PANEL  CBC WITH DIFFERENTIAL/PLATELET    EKG None  Radiology DG Knee 2 Views Right  Result Date: 01/19/2021 CLINICAL DATA:  Fall EXAM: RIGHT KNEE - 1-2 VIEW COMPARISON:  None. FINDINGS: Anterior knee dislocation. Status post total right knee arthroplasty. Hardware is intact with no evidence of perihardware fracture or lucency. Overlying skin closure staples noted. Soft tissue edema. IMPRESSION: Anterior knee hardware dislocation. Electronically Signed   By: Yetta Glassman MD   On: 01/19/2021 15:08    Procedures Procedures   Medications Ordered in ED Medications  fentaNYL (SUBLIMAZE) injection 100 mcg (100 mcg Intravenous Given 01/19/21 1336)  HYDROmorphone (DILAUDID) injection 1 mg (1 mg Intravenous Given 01/19/21 1544)    ED Course  I have reviewed the triage vital signs and the nursing notes.  Pertinent labs & imaging results that were available during my care of the patient were reviewed by me and considered in my medical decision making (see chart for details).  Clinical Course as of 01/19/21 1853  Mon Jan 19, 2021  1550 I was able to reach PA for Dr. Lyla Glassing, who will advise his currently, and they plan on seeing the patient in ED. [EW]    Clinical Course User Index [EW] Daleen Bo, MD   MDM Rules/Calculators/A&P                            Patient Vitals for the past 24 hrs:  BP Temp Pulse Resp SpO2 Height Weight  01/19/21 1530 (!) 157/71 -- 90 14 100 % -- --  01/19/21 1430 (!) 155/71 -- 89 (!) 22 100 % -- --  01/19/21 1400 140/79 -- 78 12 100 % -- --  01/19/21 1330 (!) 142/64 -- 93 13 100 % -- --  01/19/21 1328 --  98.1 F (36.7 C) -- -- -- -- --  01/19/21 1309 -- -- -- -- -- '6\' 3"'$  (1.905 m) (!) 142.4 kg  01/19/21 1308 (!) 132/54 -- 91 20 100 % -- --    3:54 PM Reevaluation with update and discussion. After initial assessment and treatment, an updated evaluation reveals pain better controlled after second narcotic injection.  Patient and wife updated on findings and plan. Daleen Bo   Medical Decision Making:  This patient is presenting for evaluation of right knee injury, which does require a range of treatment options, and is a complaint that involves a moderate risk of morbidity and mortality. The differential diagnoses include fracture, dislocation, complication from recent surgery. I decided to review old records, and in summary elderly male, at home recovering from knee replacement surgery, had some pain in the knee earlier today at rehab then fell later at home.  Unable to walk since the fall..  I did not require additional historical information from anyone.  Clinical Laboratory Tests Ordered, included CBC, Metabolic panel, and viral panel . Review indicates normal except white count high, hemoglobin low, glucose high, BUN high. Radiologic Tests Ordered, included right knee.  I independently Visualized: Radiographic images, which show dislocated prosthesis, with high riding patella indicating patellar tendon rupture    Critical Interventions-clinical evaluation, medication treatment, radiography, screening labs, repeat analgesia, observation, reassessment.  Discussion with orthopedics for consultation and admission  After These Interventions, the Patient was reevaluated and was found with dislocated right knee with/prosthesis, and likely disrupted right knee extensor  mechanism.  Patient with dopplerable pulses, right foot, and normal sensation right foot.  CRITICAL CARE-no Performed by: Daleen Bo  Nursing Notes Reviewed/ Care Coordinated Applicable Imaging Reviewed Interpretation of  Laboratory Data incorporated into ED treatment   Plan as per orthopedics    Final Clinical Impression(s) / ED Diagnoses Final diagnoses:  Dislocation of right knee, initial encounter  Patellar tendon rupture, right, initial encounter    Rx / DC Orders ED Discharge Orders     None        Daleen Bo, MD 01/19/21 [phone]

## 2021-01-19 NOTE — Consult Note (Addendum)
Reason for Consult:right knee dislocation after total knee replacement Referring Physician: EDP  Harry [name] is an 70 y.o. male.  HPI:  70 yo male who had a total knee replacement 10 days ago and reported the knee feeling unstable and then giving out on him in therapy. He was unable to stand after the knee "went out of place." Transported by EMS to the ER where XRAYS show a knee dislocation.   Past Medical History:  Diagnosis Date   Arthritis    Cancer ([place])    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   GERD (gastroesophageal reflux disease)    Hypertension    Tendonitis     Past Surgical History:  Procedure Laterality Date   KNEE ARTHROPLASTY Right 01/07/2021   Procedure: COMPUTER ASSISTED TOTAL KNEE ARTHROPLASTY;  Surgeon: Rod Can, MD;  Location: [facility];  Service: Orthopedics;  Laterality: Right;   KNEE SURGERY     NOSE SURGERY     WISDOM TOOTH EXTRACTION      No family history on file.  Social History:  reports that he quit smoking about 12 years ago. His smoking use included cigarettes. He has never used smokeless tobacco. He reports current alcohol use. He reports that he does not use drugs.  Allergies: No Known Allergies  Medications: I have reviewed the patient's current medications.  No results found for this or any previous visit (from the past 48 hour(s)).  DG Knee 2 Views Right  Result Date: 01/19/2021 CLINICAL DATA:  Fall EXAM: RIGHT KNEE - 1-2 VIEW COMPARISON:  None. FINDINGS: Anterior knee dislocation. Status post total right knee arthroplasty. Hardware is intact with no evidence of perihardware fracture or lucency. Overlying skin closure staples noted. Soft tissue edema. IMPRESSION: Anterior knee hardware dislocation. Electronically Signed   By: Yetta Glassman MD   On: 01/19/2021 15:08    Review of Systems Blood pressure 137/86, pulse 94, temperature 98.1 F (36.7 C), resp. rate 18, height '6\' 3"'$  (1.905 m), weight (!) 142.4 kg, SpO2 100 %. Physical Exam Awake and  alert in moderate distress. Pain free AROM of the neck. Bilateral UEs with normal AROM. Left LE with pain free AROM.  Right knee with obvious deformity of the knee consistent with a posterior knee dislocation. He does have intact pulses at the foot and intact motor and sensory. Skin intact. Dressing intact.   Assessment/Plan: Right knee dislocation after TKR. Plan closed reduction under conscious sedation followed by immobilizer.   Knee reduced and post reduction XRAYs look good. Bed Rest for now.  Ok to eat. Likely surgery on Wednesday.  Medical optimization, thank you!  [name] 01/19/2021, 4:56 PM

## 2021-01-19 NOTE — ED Triage Notes (Signed)
Ems brings pt in from home for right knee pain. Pt states he had right knee surgery on 7/20. Now the right knee is swollen and more painful.

## 2021-01-19 NOTE — Progress Notes (Signed)
Orthopedic Tech Progress Note Patient Details:  Harry [name] 08/09/1950 [id number]  Ortho Devices Type of Ortho Device: Knee Immobilizer Ortho Device/Splint Location: Right Knee Ortho Device/Splint Interventions: Application      Harry [name] E Theresa Wedel 01/19/2021, 5:32 PM

## 2021-01-19 NOTE — ED Provider Notes (Signed)
Received signout from Dr. Eulis Foster  Follow-up on recommendations from Ortho.  Knee dislocation.  Recent knee surgery.  Ortho at bedside, recommend procedural sedation, they will perform reduction  Perform procedural sedation while Ortho successfully completed close reduction at bedside.  Patient tolerated well using propofol.  Ortho has requested medicine to admit as primary.  Placed consult to hospitalist.  .Sedation  Date/Time: 01/19/2021 5:38 PM Performed by: Lucrezia Starch, MD Authorized by: Lucrezia Starch, MD   Consent:    Consent obtained:  Verbal   Consent given by:  Patient   Risks discussed:  Allergic reaction, dysrhythmia, inadequate sedation, nausea, prolonged hypoxia resulting in organ damage, prolonged sedation necessitating reversal and vomiting   Alternatives discussed:  Analgesia without sedation, anxiolysis and regional anesthesia Universal protocol:    Immediately prior to procedure, a time out was called: yes     Patient identity confirmed:  Arm band and verbally with patient Indications:    Procedure performed:  Dislocation reduction   Procedure necessitating sedation performed by:  Different physician Pre-sedation assessment:    Time since last food or drink:  Greater than 6 hours   ASA classification: class 2 - patient with mild systemic disease     Mallampati score:  I - soft palate, uvula, fauces, pillars visible   Neck mobility: reduced     Pre-sedation assessments completed and reviewed: airway patency, cardiovascular function, hydration status, mental status, nausea/vomiting, pain level, respiratory function and temperature   Immediate pre-procedure details:    Reassessment: Patient reassessed immediately prior to procedure     Reviewed: vital signs   Procedure details (see MAR for exact dosages):    Preoxygenation:  Nasal cannula   Sedation:  Propofol   Intended level of sedation: deep   Analgesia:  None   Intra-procedure monitoring:  Blood  pressure monitoring, cardiac monitor, frequent LOC assessments, frequent vital sign checks, continuous pulse oximetry and continuous capnometry   Intra-procedure events: none     Total Provider sedation time (minutes):  15 Post-procedure details:    Attendance: Constant attendance by certified staff until patient recovered     Post-sedation assessments completed and reviewed: airway patency, cardiovascular function, hydration status, mental status, nausea/vomiting, pain level, respiratory function and temperature     Procedure completion:  Tolerated well, no immediate complications    Lucrezia Starch, MD 01/19/21 [phone]

## 2021-01-19 NOTE — Progress Notes (Signed)
Orthopedic Tech Progress Note Patient Details:  Harry [name] 10-16-1950 [id number] Knee Immobilizer was left in patient's room for application after reduction.  Ortho Devices Type of Ortho Device: Knee Immobilizer Ortho Device/Splint Location: Right Knee Ortho Device/Splint Interventions: Ordered      Cyndee Giammarco E Tivis Wherry 01/19/2021, 5:02 PM

## 2021-01-19 NOTE — H&P (View-Only) (Signed)
Reason for Consult:right knee dislocation after total knee replacement Referring Physician: EDP  Harry [name] is an 70 y.o. male.  HPI:  70 yo male who had a total knee replacement 10 days ago and reported the knee feeling unstable and then giving out on him in therapy. He was unable to stand after the knee "went out of place." Transported by EMS to the ER where XRAYS show a knee dislocation.   Past Medical History:  Diagnosis Date   Arthritis    Cancer ([place])    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   GERD (gastroesophageal reflux disease)    Hypertension    Tendonitis     Past Surgical History:  Procedure Laterality Date   KNEE ARTHROPLASTY Right 01/07/2021   Procedure: COMPUTER ASSISTED TOTAL KNEE ARTHROPLASTY;  Surgeon: Rod Can, MD;  Location: [facility];  Service: Orthopedics;  Laterality: Right;   KNEE SURGERY     NOSE SURGERY     WISDOM TOOTH EXTRACTION      No family history on file.  Social History:  reports that he quit smoking about 12 years ago. His smoking use included cigarettes. He has never used smokeless tobacco. He reports current alcohol use. He reports that he does not use drugs.  Allergies: No Known Allergies  Medications: I have reviewed the patient's current medications.  No results found for this or any previous visit (from the past 48 hour(s)).  DG Knee 2 Views Right  Result Date: 01/19/2021 CLINICAL DATA:  Fall EXAM: RIGHT KNEE - 1-2 VIEW COMPARISON:  None. FINDINGS: Anterior knee dislocation. Status post total right knee arthroplasty. Hardware is intact with no evidence of perihardware fracture or lucency. Overlying skin closure staples noted. Soft tissue edema. IMPRESSION: Anterior knee hardware dislocation. Electronically Signed   By: Yetta Glassman MD   On: 01/19/2021 15:08    Review of Systems Blood pressure 137/86, pulse 94, temperature 98.1 F (36.7 C), resp. rate 18, height '6\' 3"'$  (1.905 m), weight (!) 142.4 kg, SpO2 100 %. Physical Exam Awake and  alert in moderate distress. Pain free AROM of the neck. Bilateral UEs with normal AROM. Left LE with pain free AROM.  Right knee with obvious deformity of the knee consistent with a posterior knee dislocation. He does have intact pulses at the foot and intact motor and sensory. Skin intact. Dressing intact.   Assessment/Plan: Right knee dislocation after TKR. Plan closed reduction under conscious sedation followed by immobilizer.   Knee reduced and post reduction XRAYs look good. Bed Rest for now.  Ok to eat. Likely surgery on Wednesday.  Medical optimization, thank you!  Harry [name] 01/19/2021, 4:56 PM

## 2021-01-20 ENCOUNTER — Inpatient Hospital Stay (HOSPITAL_COMMUNITY): Payer: Medicare Other

## 2021-01-20 ENCOUNTER — Inpatient Hospital Stay (HOSPITAL_COMMUNITY): Payer: Medicare Other | Admitting: Anesthesiology

## 2021-01-20 ENCOUNTER — Other Ambulatory Visit: Payer: Self-pay

## 2021-01-20 ENCOUNTER — Encounter (HOSPITAL_COMMUNITY)
Admission: EM | Disposition: A | Discharge status: Skilled Nursing Facility | Payer: Self-pay | Source: Home / Self Care | Attending: Orthopedic Surgery

## 2021-01-20 HISTORY — PX: KNEE CLOSED REDUCTION: SHX995

## 2021-01-20 LAB — RESP PANEL BY RT-PCR (FLU A&B, COVID) ARPGX2
Influenza A by PCR: NEGATIVE
Influenza B by PCR: NEGATIVE
SARS Coronavirus 2 by RT PCR: NEGATIVE

## 2021-01-20 LAB — SURGICAL PCR SCREEN
MRSA, PCR: NEGATIVE
Staphylococcus aureus: NEGATIVE

## 2021-01-20 SURGERY — MANIPULATION, KNEE, CLOSED
Anesthesia: General | Site: Knee | Laterality: Right

## 2021-01-20 MED ORDER — ONDANSETRON HCL 4 MG/2ML IJ SOLN
INTRAMUSCULAR | Status: AC
  Filled 2021-01-20: qty 2

## 2021-01-20 MED ORDER — POVIDONE-IODINE 10 % EX SWAB: 2.0000 "application " | Freq: Once | CUTANEOUS | Status: DC

## 2021-01-20 MED ORDER — LIDOCAINE 2% (20 MG/ML) 5 ML SYRINGE
INTRAMUSCULAR | Status: AC
  Filled 2021-01-20: qty 5

## 2021-01-20 MED ORDER — MIDAZOLAM HCL 2 MG/2ML IJ SOLN
INTRAMUSCULAR | Status: AC
  Filled 2021-01-20: qty 2

## 2021-01-20 MED ORDER — ONDANSETRON HCL 4 MG/2ML IJ SOLN: 4.0000 mg | Freq: Four times a day (QID) | INTRAMUSCULAR | Status: DC | PRN

## 2021-01-20 MED ORDER — PROSIGHT PO TABS
1.0000 | ORAL_TABLET | Freq: Two times a day (BID) | ORAL | Status: DC
  Administered 2021-01-20 – 2021-01-27 (×15): 1 via ORAL
  Filled 2021-01-20 (×15): qty 1

## 2021-01-20 MED ORDER — MORPHINE SULFATE (PF) 2 MG/ML IV SOLN
2.0000 mg | INTRAVENOUS | Status: DC | PRN
  Administered 2021-01-20: 2 mg via INTRAVENOUS
  Filled 2021-01-20: qty 1

## 2021-01-20 MED ORDER — DEXAMETHASONE SODIUM PHOSPHATE 10 MG/ML IJ SOLN
INTRAMUSCULAR | Status: DC | PRN
  Administered 2021-01-20: 5 mg via INTRAVENOUS

## 2021-01-20 MED ORDER — TRANEXAMIC ACID-NACL 1000-0.7 MG/100ML-% IV SOLN: 1000.0000 mg | INTRAVENOUS | Status: DC

## 2021-01-20 MED ORDER — LIDOCAINE 2% (20 MG/ML) 5 ML SYRINGE
INTRAMUSCULAR | Status: DC | PRN
  Administered 2021-01-20: 60 mg via INTRAVENOUS

## 2021-01-20 MED ORDER — METOCLOPRAMIDE HCL 5 MG PO TABS: 5.0000 mg | ORAL_TABLET | Freq: Three times a day (TID) | ORAL | Status: DC | PRN

## 2021-01-20 MED ORDER — ACETAMINOPHEN 500 MG PO TABS
500.0000 mg | ORAL_TABLET | Freq: Four times a day (QID) | ORAL | Status: DC
  Administered 2021-01-21 (×2): 500 mg via ORAL
  Filled 2021-01-20 (×2): qty 1

## 2021-01-20 MED ORDER — ATORVASTATIN CALCIUM 20 MG PO TABS
20.0000 mg | ORAL_TABLET | Freq: Every day | ORAL | Status: DC
  Administered 2021-01-20 – 2021-01-27 (×8): 20 mg via ORAL
  Filled 2021-01-20: qty 1
  Filled 2021-01-20: qty 2
  Filled 2021-01-20 (×6): qty 1

## 2021-01-20 MED ORDER — LACTATED RINGERS IV SOLN
INTRAVENOUS | Status: DC | PRN
Start: 2021-01-20 — End: 2021-01-20

## 2021-01-20 MED ORDER — DOCUSATE SODIUM 100 MG PO CAPS
100.0000 mg | ORAL_CAPSULE | Freq: Two times a day (BID) | ORAL | Status: DC
  Administered 2021-01-20 – 2021-01-21 (×2): 100 mg via ORAL
  Filled 2021-01-20 (×2): qty 1

## 2021-01-20 MED ORDER — PANTOPRAZOLE SODIUM 40 MG PO TBEC
40.0000 mg | DELAYED_RELEASE_TABLET | Freq: Every day | ORAL | Status: DC
  Administered 2021-01-20 – 2021-01-21 (×2): 40 mg via ORAL
  Filled 2021-01-20 (×2): qty 1

## 2021-01-20 MED ORDER — PHENYLEPHRINE HCL (PRESSORS) 10 MG/ML IV SOLN
INTRAVENOUS | Status: AC
  Filled 2021-01-20: qty 1

## 2021-01-20 MED ORDER — ADULT MULTIVITAMIN W/MINERALS CH
1.0000 | ORAL_TABLET | Freq: Every day | ORAL | Status: DC
  Administered 2021-01-20 – 2021-01-27 (×8): 1 via ORAL
  Filled 2021-01-20 (×8): qty 1

## 2021-01-20 MED ORDER — CHLORHEXIDINE GLUCONATE 4 % EX LIQD: 60.0000 mL | Freq: Once | CUTANEOUS | Status: DC

## 2021-01-20 MED ORDER — HYDROMORPHONE HCL 1 MG/ML IJ SOLN: 0.2500 mg | INTRAMUSCULAR | Status: DC | PRN

## 2021-01-20 MED ORDER — HYDROCODONE-ACETAMINOPHEN 5-325 MG PO TABS
1.0000 | ORAL_TABLET | ORAL | Status: DC | PRN
  Administered 2021-01-21: 2 via ORAL
  Administered 2021-01-22 (×2): 1 via ORAL
  Administered 2021-01-24: 2 via ORAL
  Administered 2021-01-24: 1 via ORAL
  Administered 2021-01-24 – 2021-01-27 (×13): 2 via ORAL
  Filled 2021-01-20 (×3): qty 2
  Filled 2021-01-20: qty 1
  Filled 2021-01-20 (×2): qty 2
  Filled 2021-01-20: qty 1
  Filled 2021-01-20 (×8): qty 2
  Filled 2021-01-20: qty 1
  Filled 2021-01-20 (×2): qty 2

## 2021-01-20 MED ORDER — FENTANYL CITRATE (PF) 100 MCG/2ML IJ SOLN
INTRAMUSCULAR | Status: AC
  Filled 2021-01-20: qty 2

## 2021-01-20 MED ORDER — ONDANSETRON HCL 4 MG/2ML IJ SOLN
INTRAMUSCULAR | Status: DC | PRN
  Administered 2021-01-20: 4 mg via INTRAVENOUS

## 2021-01-20 MED ORDER — PROPOFOL 10 MG/ML IV BOLUS
INTRAVENOUS | Status: AC
  Filled 2021-01-20: qty 20

## 2021-01-20 MED ORDER — METHOCARBAMOL 1000 MG/10ML IJ SOLN
500.0000 mg | Freq: Four times a day (QID) | INTRAVENOUS | Status: DC | PRN
  Filled 2021-01-20: qty 5

## 2021-01-20 MED ORDER — SUCCINYLCHOLINE CHLORIDE 200 MG/10ML IV SOSY
PREFILLED_SYRINGE | INTRAVENOUS | Status: DC | PRN
  Administered 2021-01-20: 100 mg via INTRAVENOUS

## 2021-01-20 MED ORDER — METHOCARBAMOL 500 MG PO TABS
500.0000 mg | ORAL_TABLET | Freq: Four times a day (QID) | ORAL | Status: DC | PRN
  Administered 2021-01-21: 500 mg via ORAL
  Filled 2021-01-20: qty 1

## 2021-01-20 MED ORDER — PROPOFOL 10 MG/ML IV BOLUS
INTRAVENOUS | Status: DC | PRN
  Administered 2021-01-20: 120 mg via INTRAVENOUS

## 2021-01-20 MED ORDER — ONDANSETRON HCL 4 MG PO TABS: 4.0000 mg | ORAL_TABLET | Freq: Four times a day (QID) | ORAL | Status: DC | PRN

## 2021-01-20 MED ORDER — HYDROCODONE-ACETAMINOPHEN 7.5-325 MG PO TABS
1.0000 | ORAL_TABLET | ORAL | Status: DC | PRN
  Administered 2021-01-21: 2 via ORAL
  Administered 2021-01-22 (×3): 1 via ORAL
  Administered 2021-01-23 – 2021-01-24 (×7): 2 via ORAL
  Filled 2021-01-20: qty 2
  Filled 2021-01-20 (×2): qty 1
  Filled 2021-01-20 (×3): qty 2
  Filled 2021-01-20: qty 1
  Filled 2021-01-20 (×4): qty 2

## 2021-01-20 MED ORDER — ENSURE PRE-SURGERY PO LIQD
296.0000 mL | Freq: Once | ORAL | Status: AC
  Administered 2021-01-20: 296 mL via ORAL
  Filled 2021-01-20: qty 296

## 2021-01-20 MED ORDER — DEXAMETHASONE SODIUM PHOSPHATE 10 MG/ML IJ SOLN
INTRAMUSCULAR | Status: AC
  Filled 2021-01-20: qty 1

## 2021-01-20 MED ORDER — ACETAMINOPHEN 325 MG PO TABS: 325.0000 mg | ORAL_TABLET | Freq: Four times a day (QID) | ORAL | Status: DC | PRN

## 2021-01-20 MED ORDER — CEFAZOLIN IN SODIUM CHLORIDE 3-0.9 GM/100ML-% IV SOLN: 3.0000 g | INTRAVENOUS | Status: DC

## 2021-01-20 MED ORDER — HYDROMORPHONE HCL 1 MG/ML IJ SOLN
1.0000 mg | Freq: Once | INTRAMUSCULAR | Status: DC
  Filled 2021-01-20 (×3): qty 1

## 2021-01-20 MED ORDER — VANCOMYCIN HCL 1500 MG/300ML IV SOLN
1500.0000 mg | INTRAVENOUS | Status: DC
  Filled 2021-01-20: qty 300

## 2021-01-20 MED ORDER — FENTANYL CITRATE (PF) 100 MCG/2ML IJ SOLN
INTRAMUSCULAR | Status: DC | PRN
  Administered 2021-01-20: 50 ug via INTRAVENOUS

## 2021-01-20 MED ORDER — METOCLOPRAMIDE HCL 5 MG/ML IJ SOLN: 5.0000 mg | Freq: Three times a day (TID) | INTRAMUSCULAR | Status: DC | PRN

## 2021-01-20 MED ORDER — CARBOXYMETHYLCELLUL-GLYCERIN 0.5-0.9 % OP SOLN: 1.0000 [drp] | Freq: Three times a day (TID) | OPHTHALMIC | Status: DC | PRN

## 2021-01-20 SURGICAL SUPPLY — 16 items
BAG COUNTER SPONGE SURGICOUNT (BAG) IMPLANT
BNDG ADH 1X3 SHEER STRL LF (GAUZE/BANDAGES/DRESSINGS) IMPLANT
CHLORAPREP W/TINT 26 (MISCELLANEOUS) IMPLANT
COVER SURGICAL LIGHT HANDLE (MISCELLANEOUS) IMPLANT
GAUZE SPONGE 4X4 12PLY STRL (GAUZE/BANDAGES/DRESSINGS) IMPLANT
GLOVE SURG LTX SZ8 (GLOVE) IMPLANT
GOWN STRL REUS W/TWL LRG LVL3 (GOWN DISPOSABLE) IMPLANT
GOWN STRL REUS W/TWL XL LVL3 (GOWN DISPOSABLE) IMPLANT
KIT TURNOVER KIT A (KITS) IMPLANT
MANIFOLD NEPTUNE II (INSTRUMENTS) IMPLANT
NDL SAFETY ECLIPSE 18X1.5 (NEEDLE) IMPLANT
NEEDLE HYPO 18GX1.5 SHARP (NEEDLE)
PENCIL SMOKE EVACUATOR (MISCELLANEOUS) IMPLANT
PROTECTOR NERVE ULNAR (MISCELLANEOUS) IMPLANT
SYR CONTROL 10ML LL (SYRINGE) IMPLANT
TOWEL OR 17X26 10 PK STRL BLUE (TOWEL DISPOSABLE) ×2 IMPLANT

## 2021-01-20 NOTE — Brief Op Note (Signed)
01/19/2021 - 01/20/2021  8:52 PM  PATIENT:  Harry [name]  70 y.o. male  PRE-OPERATIVE DIAGNOSIS:  Right total knee dislocation  POST-OPERATIVE DIAGNOSIS:  RIGHT TOTAL KNEE DISLOCATIO  PROCEDURE:  Procedure(s): CLOSED MANIPULATION KNEE (Right)  SURGEON:  Surgeon(s) and Role:    * Nicholes Stairs, MD - Primary  PHYSICIAN ASSISTANT: [name], PA-C  ANESTHESIA:   general  EBL:  0 cc  BLOOD ADMINISTERED:none  DRAINS: none   LOCAL MEDICATIONS USED:  NONE  SPECIMEN:  No Specimen  DISPOSITION OF SPECIMEN:  N/A  COUNTS:  YES  TOURNIQUET:  * No tourniquets in log *  DICTATION: .Note written in EPIC  PLAN OF CARE: Admit to inpatient   PATIENT DISPOSITION:  PACU - hemodynamically stable.   Delay start of Pharmacological VTE agent (>24hrs) due to surgical blood loss or risk of bleeding: not applicable

## 2021-01-20 NOTE — Anesthesia Preprocedure Evaluation (Addendum)
Anesthesia Evaluation  Patient identified by MRN, date of birth, ID band Patient awake    Reviewed: Allergy & Precautions, H&P , NPO status , Patient's Chart, lab work & pertinent test results  Airway Mallampati: I  TM Distance: >3 FB Neck ROM: Full    Dental no notable dental hx. (+) Edentulous Upper, Partial Lower, Dental Advisory Given   Pulmonary neg pulmonary ROS, former smoker,    Pulmonary exam normal breath sounds clear to auscultation       Cardiovascular hypertension, Pt. on medications  Rhythm:Regular Rate:Normal     Neuro/Psych negative neurological ROS  negative psych ROS   GI/Hepatic Neg liver ROS, GERD  Medicated,  Endo/Other  Morbid obesity  Renal/GU negative Renal ROS  negative genitourinary   Musculoskeletal  (+) Arthritis , Osteoarthritis,    Abdominal   Peds  Hematology negative hematology ROS (+)   Anesthesia Other Findings   Reproductive/Obstetrics negative OB ROS                            Anesthesia Physical Anesthesia Plan  ASA: 3  Anesthesia Plan: General   Post-op Pain Management:    Induction: Intravenous, Rapid sequence and Cricoid pressure planned  PONV Risk Score and Plan: 3 and Ondansetron and Dexamethasone  Airway Management Planned: Oral ETT  Additional Equipment:   Intra-op Plan:   Post-operative Plan: Extubation in OR  Informed Consent: I have reviewed the patients History and Physical, chart, labs and discussed the procedure including the risks, benefits and alternatives for the proposed anesthesia with the patient or authorized representative who has indicated his/her understanding and acceptance.     Dental advisory given  Plan Discussed with: CRNA  Anesthesia Plan Comments:        Anesthesia Quick Evaluation

## 2021-01-20 NOTE — Progress Notes (Signed)
On-call orthopedic surgery team for [facility] was contacted following the patient having some discomfort in the knee.  He states he was repositioning himself in the bed and felt a pop.  He was wearing the knee immobilizer.  X-rays were obtained which did show a recurrent dislocation of his right total knee arthroplasty.  Examination right lower extremity:  He has moderate swelling with nicely healed anterior incision with staples are intact.  Obvious deformity and flexion through the knee.  Moderate pain on palpation and swelling.  Palpable 2+ dorsalis pedis pulse.  2+ edema pedal.  Assessment and plan  Right total knee arthroplasty dislocation  -Plan to proceed with similar urgent closed reduction in the operative theater.  We are unable to provide conscious sedation on the floor and he will need relaxation given the moderate pain and large body habitus.  We discussed the risk and benefits with the patient and his wife.  They have elected to proceed with closed reduction in the operating room.  -He will be readmitted post reduction to the orthopedic floor.  While there he will be n.p.o. tonight at midnight and plan for revision surgery tomorrow with Dr. Lyla Glassing.

## 2021-01-20 NOTE — Op Note (Signed)
Date of Surgery: 01/20/2021  INDICATIONS: Harry [name] is a 70 y.o.-year-old male with a right total knee arthroplasty dislocation.  Unfortunately, sustained similar event while participating in outpatient therapy just yesterday.  This was closed reduced in the emergency department.  He was admitted for revision tomorrow.  However, while on the floor trying to scoot up in the bed he felt a pop in the knee had immediate and increased pain and deformity.  X-rays showed recurrent dislocation.  He was indicated for closed manipulation and reduction in preparation for surgery tomorrow.;  The Patient did consent to the procedure after discussion of the risks and benefits.  PREOPERATIVE DIAGNOSIS: Right total knee arthroplasty dislocation  POSTOPERATIVE DIAGNOSIS: Same.  PROCEDURE: Closed reduction of right total knee arthroplasty  SURGEON: Geralynn Rile, M.D.  ASSIST: [name], PA-C  Assistant attestation:  PA Mcclung present for the entire procedure..  ANESTHESIA:  general  IV FLUIDS AND URINE: See anesthesia.  ESTIMATED BLOOD LOSS: 0 mL.  IMPLANTS: None  DRAINS: None  COMPLICATIONS: None.  DESCRIPTION OF PROCEDURE: The patient was brought to the operating room and placed supine on the operating table.  The patient had been signed prior to the procedure and this was documented. The patient had the anesthesia placed by the anesthesiologist.  A time-out was performed to confirm that this was the correct patient, site, side and location.  Patient did not receive antibiotics due to the close nature of the procedure.  No tourniquet was used.    Following adequate muscle relaxation we performed a reduction maneuver on the right knee.  With the femur held in slight flexion by my assistant, I then flexed the knee to about 120 degrees.  I then anteriorly translated the tibia against the femur.  We felt an audible clunk and visually noted an anatomic appearance of the knee.  We then moved into  full extension where the knee remained stable.  We then brought in portable radiography to confirm on AP and lateral x-rays that the knee was in fact reduced.  Next, we applied a well-padded knee immobilizer in full extension.  Patient was then awakened from anesthetic in stable condition.  All counts were correct.  Transported PACU in stable condition.    POSTOPERATIVE PLAN:  Mr. Veva Holes will return to the floor in his knee immobilizer.  He will be on bedrest and nonweightbearing to the right leg.  He will be n.p.o. tonight at midnight in preparation for surgical treatment of this now unstable right total knee arthroplasty.

## 2021-01-20 NOTE — Progress Notes (Signed)
    Subjective:  Patient reports pain as mild to moderate.  Denies N/V/CP/SOB.   Objective:   VITALS:   Vitals:   01/19/21 2332 01/20/21 0036 01/20/21 0536 01/20/21 0947  BP:  117/75 115/71 107/66  Pulse:  79 77 72  Resp:  '17 17 18  '$ Temp: 97.7 F (36.5 C) 99.1 F (37.3 C) 98.6 F (37 C) 97.7 F (36.5 C)  TempSrc: Oral Oral Oral Oral  SpO2:  100% 99% 97%  Weight:      Height:        NAD ABD soft Neurovascular intact Sensation intact distally Intact pulses distally   Lab Results  Component Value Date   WBC 15.9 (H) 01/19/2021   HGB 10.9 (L) 01/19/2021   HCT 34.3 (L) 01/19/2021   MCV 93.5 01/19/2021   PLT 231 01/19/2021   BMET    Component Value Date/Time   NA 142 01/19/2021 1645   K 4.2 01/19/2021 1645   CL 107 01/19/2021 1645   CO2 27 01/19/2021 1645   GLUCOSE 112 (H) 01/19/2021 1645   BUN 26 (H) 01/19/2021 1645   CREATININE 1.16 01/19/2021 1645   CALCIUM 9.1 01/19/2021 1645   GFRNONAA >60 01/19/2021 1645   GFRAA 88 (L) 05/15/2012 1005     Assessment/Plan:     Active Problems:   Knee dislocation, right, initial encounter   WBAT with walker DVT ppx: SCDs, TEDS PO pain control Dispo: Surgery tomorrow AM.  Discussed knee revision with extensor mechanism repair with patient.  Plan on surgery with Dr.Swinteck and Dr.Varkey.  NPO after midnight Tuesday     Hughey Rittenberry B Keara Pagliarulo 01/20/2021, 12:07 PM  [facility] now [employer] [address], [place], [place], [place] [address] Phone: [phone] [email or web address] Facebook  [employer]

## 2021-01-20 NOTE — Anesthesia Procedure Notes (Addendum)
Procedure Name: Intubation Date/Time: 01/20/2021 8:39 PM Performed by: Cleda Daub, CRNA Pre-anesthesia Checklist: Patient identified, Emergency Drugs available, Suction available and Patient being monitored Patient Re-evaluated:Patient Re-evaluated prior to induction Oxygen Delivery Method: Circle system utilized Preoxygenation: Pre-oxygenation with 100% oxygen Induction Type: IV induction and Rapid sequence Laryngoscope Size: Mac and 4 Grade View: Grade I Tube type: Oral Tube size: 7.5 mm Number of attempts: 1 Airway Equipment and Method: Stylet and Oral airway Placement Confirmation: ETT inserted through vocal cords under direct vision, positive ETCO2 and breath sounds checked- equal and bilateral Secured at: 24 cm Tube secured with: Tape Dental Injury: Teeth and Oropharynx as per pre-operative assessment

## 2021-01-20 NOTE — Progress Notes (Addendum)
[name], PA notified that the patient feels as if his knee dislocated again while trying to pull himself up in bed. 2 view xray of right knee has been ordered.   Kevan updated with results of knee xray.

## 2021-01-20 NOTE — Anesthesia Postprocedure Evaluation (Signed)
Anesthesia Post Note  Patient: Harry [name]  Procedure(s) Performed: CLOSED MANIPULATION KNEE (Right: Knee)     Patient location during evaluation: PACU Anesthesia Type: General Level of consciousness: awake and alert Pain management: pain level controlled Vital Signs Assessment: post-procedure vital signs reviewed and stable Respiratory status: spontaneous breathing, nonlabored ventilation and respiratory function stable Cardiovascular status: blood pressure returned to baseline and stable Postop Assessment: no apparent nausea or vomiting Anesthetic complications: no   No notable events documented.  Last Vitals:  Vitals:   01/20/21 0947 01/20/21 1331  BP: 107/66 105/71  Pulse: 72 88  Resp: 18 16  Temp: 36.5 C 36.9 C  SpO2: 97% 98%    Last Pain:  Vitals:   01/20/21 1856  TempSrc:   PainSc: 6                  Mehar Kirkwood,W. EDMOND

## 2021-01-20 NOTE — Transfer of Care (Signed)
Immediate Anesthesia Transfer of Care Note  Patient: Harry [name]  Procedure(s) Performed: CLOSED MANIPULATION KNEE (Right: Knee)  Patient Location: PACU  Anesthesia Type:General  Level of Consciousness: awake, alert , oriented and patient cooperative  Airway & Oxygen Therapy: Patient Spontanous Breathing and Patient connected to face mask oxygen  Post-op Assessment: Report given to RN and Post -op Vital signs reviewed and stable  Post vital signs: Reviewed and stable  Last Vitals:  Vitals Value Taken Time  BP 114/54 01/20/21 2100  Temp    Pulse 79 01/20/21 2104  Resp 23 01/20/21 2104  SpO2 97 % 01/20/21 2104  Vitals shown include unvalidated device data.  Last Pain:  Vitals:   01/20/21 1856  TempSrc:   PainSc: 6       Patients Stated Pain Goal: 2 ([id number])  Complications: No notable events documented.

## 2021-01-20 NOTE — H&P (Signed)
On-call orthopedic surgery team for [facility] was contacted following the patient having some discomfort in the knee.  He states he was repositioning himself in the bed and felt a pop.  He was wearing the knee immobilizer.  X-rays were obtained which did show a recurrent dislocation of his right total knee arthroplasty.   Examination right lower extremity:  He has moderate swelling with nicely healed anterior incision with staples are intact.  Obvious deformity and flexion through the knee.  Moderate pain on palpation and swelling.  Palpable 2+ dorsalis pedis pulse.  2+ edema pedal.   Assessment and plan   Right total knee arthroplasty dislocation   -Plan to proceed with similar urgent closed reduction in the operative theater.  We are unable to provide conscious sedation on the floor and he will need relaxation given the moderate pain and large body habitus.  We discussed the risk and benefits with the patient and his wife.  They have elected to proceed with closed reduction in the operating room.   -He will be readmitted post reduction to the orthopedic floor.  While there he will be n.p.o. tonight at midnight and plan for revision surgery tomorrow with Dr. Lyla Glassing.

## 2021-01-21 ENCOUNTER — Encounter (HOSPITAL_COMMUNITY): Payer: Self-pay | Admitting: Orthopedic Surgery

## 2021-01-21 ENCOUNTER — Inpatient Hospital Stay (HOSPITAL_COMMUNITY): Payer: Medicare Other | Admitting: Certified Registered"

## 2021-01-21 ENCOUNTER — Inpatient Hospital Stay (HOSPITAL_COMMUNITY): Payer: Medicare Other

## 2021-01-21 ENCOUNTER — Encounter (HOSPITAL_COMMUNITY)
Admission: EM | Disposition: A | Discharge status: Skilled Nursing Facility | Payer: Self-pay | Source: Home / Self Care | Attending: Orthopedic Surgery

## 2021-01-21 HISTORY — PX: TOTAL KNEE ARTHROPLASTY: SHX125

## 2021-01-21 HISTORY — PX: ORIF TIBIA PLATEAU: SHX2132

## 2021-01-21 LAB — POCT I-STAT, CHEM 8
BUN: 20 mg/dL (ref 8–23)
BUN: 21 mg/dL (ref 8–23)
Calcium, Ion: 1.14 mmol/L — ABNORMAL LOW (ref 1.15–1.40)
Calcium, Ion: 1.12 mmol/L — ABNORMAL LOW (ref 1.15–1.40)
Chloride: 101 mmol/L (ref 98–111)
Chloride: 101 mmol/L (ref 98–111)
Creatinine, Ser: 1 mg/dL (ref 0.61–1.24)
Creatinine, Ser: 1.1 mg/dL (ref 0.61–1.24)
Glucose, Bld: 114 mg/dL — ABNORMAL HIGH (ref 70–99)
Glucose, Bld: 135 mg/dL — ABNORMAL HIGH (ref 70–99)
HCT: 26 % — ABNORMAL LOW (ref 39.0–52.0)
HCT: 26 % — ABNORMAL LOW (ref 39.0–52.0)
Hemoglobin: 8.8 g/dL — ABNORMAL LOW (ref 13.0–17.0)
Hemoglobin: 8.8 g/dL — ABNORMAL LOW (ref 13.0–17.0)
Potassium: 4.4 mmol/L (ref 3.5–5.1)
Potassium: 4.8 mmol/L (ref 3.5–5.1)
Sodium: 136 mmol/L (ref 135–145)
Sodium: 136 mmol/L (ref 135–145)
TCO2: 23 mmol/L (ref 22–32)
TCO2: 23 mmol/L (ref 22–32)

## 2021-01-21 LAB — HEMOGLOBIN AND HEMATOCRIT, BLOOD
HCT: 30.1 % — ABNORMAL LOW (ref 39.0–52.0)
Hemoglobin: 9.5 g/dL — ABNORMAL LOW (ref 13.0–17.0)

## 2021-01-21 LAB — PREPARE RBC (CROSSMATCH)

## 2021-01-21 SURGERY — ARTHROPLASTY, KNEE, TOTAL
Anesthesia: Regional | Site: Knee | Laterality: Right

## 2021-01-21 MED ORDER — ISOPROPYL ALCOHOL 70 % SOLN
Status: DC | PRN
  Administered 2021-01-21: 1 via TOPICAL

## 2021-01-21 MED ORDER — FENTANYL CITRATE (PF) 100 MCG/2ML IJ SOLN
50.0000 ug | INTRAMUSCULAR | Status: AC
  Administered 2021-01-21: 100 ug via INTRAVENOUS
  Filled 2021-01-21: qty 2

## 2021-01-21 MED ORDER — BUPIVACAINE-EPINEPHRINE 0.25% -1:200000 IJ SOLN: INTRAMUSCULAR | Status: DC | PRN

## 2021-01-21 MED ORDER — PHENYLEPHRINE 40 MCG/ML (10ML) SYRINGE FOR IV PUSH (FOR BLOOD PRESSURE SUPPORT)
PREFILLED_SYRINGE | INTRAVENOUS | Status: DC | PRN
  Administered 2021-01-21: 80 ug via INTRAVENOUS
  Administered 2021-01-21: 120 ug via INTRAVENOUS
  Administered 2021-01-21: 80 ug via INTRAVENOUS

## 2021-01-21 MED ORDER — FENTANYL CITRATE (PF) 100 MCG/2ML IJ SOLN: 25.0000 ug | INTRAMUSCULAR | Status: DC | PRN

## 2021-01-21 MED ORDER — SODIUM CHLORIDE (PF) 0.9 % IJ SOLN: INTRAMUSCULAR | Status: DC | PRN

## 2021-01-21 MED ORDER — DEXAMETHASONE SODIUM PHOSPHATE 10 MG/ML IJ SOLN
INTRAMUSCULAR | Status: DC | PRN
  Administered 2021-01-21: 10 mg

## 2021-01-21 MED ORDER — ONDANSETRON HCL 4 MG/2ML IJ SOLN
INTRAMUSCULAR | Status: AC
  Filled 2021-01-21: qty 2

## 2021-01-21 MED ORDER — ALBUMIN HUMAN 5 % IV SOLN
12.5000 g | Freq: Once | INTRAVENOUS | Status: AC
  Administered 2021-01-21: 12.5 g via INTRAVENOUS

## 2021-01-21 MED ORDER — METOCLOPRAMIDE HCL 5 MG PO TABS
5.0000 mg | ORAL_TABLET | Freq: Three times a day (TID) | ORAL | Status: DC | PRN
  Filled 2021-01-21: qty 2

## 2021-01-21 MED ORDER — ALBUMIN HUMAN 5 % IV SOLN
INTRAVENOUS | Status: AC
  Filled 2021-01-21: qty 250

## 2021-01-21 MED ORDER — ACETAMINOPHEN 10 MG/ML IV SOLN
1000.0000 mg | Freq: Once | INTRAVENOUS | Status: DC | PRN
  Administered 2021-01-21: 1000 mg via INTRAVENOUS

## 2021-01-21 MED ORDER — HYDROMORPHONE HCL 1 MG/ML IJ SOLN
INTRAMUSCULAR | Status: AC
  Filled 2021-01-21: qty 1

## 2021-01-21 MED ORDER — ONDANSETRON HCL 4 MG PO TABS
4.0000 mg | ORAL_TABLET | Freq: Four times a day (QID) | ORAL | Status: DC | PRN
  Filled 2021-01-21: qty 1

## 2021-01-21 MED ORDER — DOCUSATE SODIUM 100 MG PO CAPS
100.0000 mg | ORAL_CAPSULE | Freq: Two times a day (BID) | ORAL | Status: DC
  Administered 2021-01-22 – 2021-01-27 (×12): 100 mg via ORAL
  Filled 2021-01-21 (×12): qty 1

## 2021-01-21 MED ORDER — ESMOLOL HCL 100 MG/10ML IV SOLN
INTRAVENOUS | Status: AC
  Filled 2021-01-21: qty 10

## 2021-01-21 MED ORDER — MEPERIDINE HCL 50 MG/ML IJ SOLN: 6.2500 mg | INTRAMUSCULAR | Status: DC | PRN

## 2021-01-21 MED ORDER — CEFAZOLIN SODIUM-DEXTROSE 2-4 GM/100ML-% IV SOLN
INTRAVENOUS | Status: AC
  Filled 2021-01-21: qty 100

## 2021-01-21 MED ORDER — CEFAZOLIN SODIUM-DEXTROSE 2-4 GM/100ML-% IV SOLN
2.0000 g | Freq: Four times a day (QID) | INTRAVENOUS | Status: DC
  Filled 2021-01-21: qty 100

## 2021-01-21 MED ORDER — ENSURE PRE-SURGERY PO LIQD: 296.0000 mL | Freq: Once | ORAL | Status: DC

## 2021-01-21 MED ORDER — ALUM & MAG HYDROXIDE-SIMETH 200-200-20 MG/5ML PO SUSP: 30.0000 mL | ORAL | Status: DC | PRN

## 2021-01-21 MED ORDER — DEXAMETHASONE SODIUM PHOSPHATE 10 MG/ML IJ SOLN
INTRAMUSCULAR | Status: DC | PRN
  Administered 2021-01-21: 5 mg via INTRAVENOUS

## 2021-01-21 MED ORDER — PHENYLEPHRINE HCL-NACL 20-0.9 MG/250ML-% IV SOLN
INTRAVENOUS | Status: DC | PRN
  Administered 2021-01-21: 30 ug/min via INTRAVENOUS

## 2021-01-21 MED ORDER — ENOXAPARIN SODIUM 30 MG/0.3ML IJ SOSY
30.0000 mg | PREFILLED_SYRINGE | Freq: Two times a day (BID) | INTRAMUSCULAR | Status: DC
  Administered 2021-01-22 – 2021-01-27 (×11): 30 mg via SUBCUTANEOUS
  Filled 2021-01-21 (×13): qty 0.3

## 2021-01-21 MED ORDER — SUGAMMADEX SODIUM 500 MG/5ML IV SOLN: INTRAVENOUS | Status: DC | PRN

## 2021-01-21 MED ORDER — ACETAMINOPHEN 10 MG/ML IV SOLN
INTRAVENOUS | Status: AC
  Filled 2021-01-21: qty 100

## 2021-01-21 MED ORDER — SUGAMMADEX SODIUM 500 MG/5ML IV SOLN
INTRAVENOUS | Status: AC
  Filled 2021-01-21: qty 5

## 2021-01-21 MED ORDER — OXYCODONE HCL 5 MG PO TABS
5.0000 mg | ORAL_TABLET | Freq: Once | ORAL | Status: AC | PRN
  Administered 2021-01-21: 5 mg via ORAL

## 2021-01-21 MED ORDER — 0.9 % SODIUM CHLORIDE (POUR BTL) OPTIME
TOPICAL | Status: DC | PRN
  Administered 2021-01-21: 1000 mL

## 2021-01-21 MED ORDER — ALBUMIN HUMAN 5 % IV SOLN: INTRAVENOUS | Status: DC | PRN

## 2021-01-21 MED ORDER — OXYCODONE HCL 5 MG PO TABS
ORAL_TABLET | ORAL | Status: AC
  Filled 2021-01-21: qty 1

## 2021-01-21 MED ORDER — FENTANYL CITRATE (PF) 250 MCG/5ML IJ SOLN
INTRAMUSCULAR | Status: AC
  Filled 2021-01-21: qty 5

## 2021-01-21 MED ORDER — DIPHENHYDRAMINE HCL 12.5 MG/5ML PO ELIX
12.5000 mg | ORAL_SOLUTION | ORAL | Status: DC | PRN
  Administered 2021-01-23: 25 mg via ORAL
  Filled 2021-01-21: qty 10

## 2021-01-21 MED ORDER — CEFAZOLIN IN SODIUM CHLORIDE 3-0.9 GM/100ML-% IV SOLN
INTRAVENOUS | Status: AC
  Filled 2021-01-21: qty 100

## 2021-01-21 MED ORDER — BUPIVACAINE HCL (PF) 0.5 % IJ SOLN
INTRAMUSCULAR | Status: DC | PRN
  Administered 2021-01-21: 25 mL via PERINEURAL

## 2021-01-21 MED ORDER — OXYCODONE HCL 5 MG/5ML PO SOLN
5.0000 mg | Freq: Once | ORAL | Status: AC | PRN
Start: 2021-01-21 — End: 2021-01-21

## 2021-01-21 MED ORDER — ACETAMINOPHEN 160 MG/5ML PO SOLN: 1000.0000 mg | Freq: Once | ORAL | Status: DC | PRN

## 2021-01-21 MED ORDER — LACTATED RINGERS IV SOLN: INTRAVENOUS | Status: DC | PRN

## 2021-01-21 MED ORDER — TRANEXAMIC ACID-NACL 1000-0.7 MG/100ML-% IV SOLN
1000.0000 mg | INTRAVENOUS | Status: AC
  Administered 2021-01-21: 1000 mg via INTRAVENOUS

## 2021-01-21 MED ORDER — HYDROMORPHONE HCL 1 MG/ML IJ SOLN
INTRAMUSCULAR | Status: DC | PRN
  Administered 2021-01-21 (×4): .5 mg via INTRAVENOUS

## 2021-01-21 MED ORDER — KETOROLAC TROMETHAMINE 30 MG/ML IJ SOLN
INTRAMUSCULAR | Status: AC
  Filled 2021-01-21: qty 1

## 2021-01-21 MED ORDER — POLYETHYLENE GLYCOL 3350 17 G PO PACK
17.0000 g | PACK | Freq: Every day | ORAL | Status: DC | PRN
  Administered 2021-01-22: 17 g via ORAL
  Filled 2021-01-21: qty 1

## 2021-01-21 MED ORDER — LIDOCAINE 2% (20 MG/ML) 5 ML SYRINGE
INTRAMUSCULAR | Status: AC
  Filled 2021-01-21: qty 5

## 2021-01-21 MED ORDER — DEXMEDETOMIDINE (PRECEDEX) IN NS 20 MCG/5ML (4 MCG/ML) IV SYRINGE
PREFILLED_SYRINGE | INTRAVENOUS | Status: AC
  Filled 2021-01-21: qty 5

## 2021-01-21 MED ORDER — HYDROMORPHONE HCL 1 MG/ML IJ SOLN
0.2500 mg | INTRAMUSCULAR | Status: DC | PRN
  Administered 2021-01-21 (×3): 0.5 mg via INTRAVENOUS

## 2021-01-21 MED ORDER — SODIUM CHLORIDE 0.9 % IV SOLN: INTRAVENOUS | Status: DC

## 2021-01-21 MED ORDER — PHENYLEPHRINE HCL (PRESSORS) 10 MG/ML IV SOLN
INTRAVENOUS | Status: AC
  Filled 2021-01-21: qty 2

## 2021-01-21 MED ORDER — ACETAMINOPHEN 500 MG PO TABS: 1000.0000 mg | ORAL_TABLET | Freq: Once | ORAL | Status: DC | PRN

## 2021-01-21 MED ORDER — TOBRAMYCIN SULFATE 1.2 G IJ SOLR
INTRAMUSCULAR | Status: AC
  Filled 2021-01-21: qty 1.2

## 2021-01-21 MED ORDER — PHENOL 1.4 % MT LIQD: 1.0000 | OROMUCOSAL | Status: DC | PRN

## 2021-01-21 MED ORDER — ESMOLOL HCL 100 MG/10ML IV SOLN
INTRAVENOUS | Status: DC | PRN
  Administered 2021-01-21: 30 mg via INTRAVENOUS

## 2021-01-21 MED ORDER — TRANEXAMIC ACID-NACL 1000-0.7 MG/100ML-% IV SOLN
1000.0000 mg | Freq: Once | INTRAVENOUS | Status: AC
  Administered 2021-01-21: 1000 mg via INTRAVENOUS

## 2021-01-21 MED ORDER — VANCOMYCIN HCL 1500 MG/300ML IV SOLN
1500.0000 mg | Freq: Once | INTRAVENOUS | Status: AC
  Administered 2021-01-21: 1500 mg via INTRAVENOUS
  Filled 2021-01-21: qty 300

## 2021-01-21 MED ORDER — KETOROLAC TROMETHAMINE 30 MG/ML IJ SOLN: INTRAMUSCULAR | Status: DC | PRN

## 2021-01-21 MED ORDER — TRANEXAMIC ACID-NACL 1000-0.7 MG/100ML-% IV SOLN
INTRAVENOUS | Status: AC
  Filled 2021-01-21: qty 100

## 2021-01-21 MED ORDER — DEXAMETHASONE SODIUM PHOSPHATE 10 MG/ML IJ SOLN
INTRAMUSCULAR | Status: AC
  Filled 2021-01-21: qty 1

## 2021-01-21 MED ORDER — SODIUM CHLORIDE 0.9 % IV SOLN
INTRAVENOUS | Status: DC | PRN
Start: 2021-01-21 — End: 2021-01-21

## 2021-01-21 MED ORDER — VANCOMYCIN HCL 1 G IV SOLR
INTRAVENOUS | Status: DC | PRN
  Administered 2021-01-21: 1000 mg via TOPICAL

## 2021-01-21 MED ORDER — FENTANYL CITRATE (PF) 100 MCG/2ML IJ SOLN
INTRAMUSCULAR | Status: DC | PRN
  Administered 2021-01-21 (×5): 50 ug via INTRAVENOUS

## 2021-01-21 MED ORDER — PROPOFOL 1000 MG/100ML IV EMUL
INTRAVENOUS | Status: AC
  Filled 2021-01-21: qty 100

## 2021-01-21 MED ORDER — ONDANSETRON HCL 4 MG/2ML IJ SOLN
4.0000 mg | Freq: Four times a day (QID) | INTRAMUSCULAR | Status: DC | PRN
  Administered 2021-01-21: 4 mg via INTRAVENOUS

## 2021-01-21 MED ORDER — ROCURONIUM BROMIDE 10 MG/ML (PF) SYRINGE
PREFILLED_SYRINGE | INTRAVENOUS | Status: AC
  Filled 2021-01-21: qty 10

## 2021-01-21 MED ORDER — DEXMEDETOMIDINE (PRECEDEX) IN NS 20 MCG/5ML (4 MCG/ML) IV SYRINGE
PREFILLED_SYRINGE | INTRAVENOUS | Status: DC | PRN
  Administered 2021-01-21 (×2): 8 ug via INTRAVENOUS
  Administered 2021-01-21: 20 ug via INTRAVENOUS
  Administered 2021-01-21: 4 ug via INTRAVENOUS

## 2021-01-21 MED ORDER — CEFAZOLIN IN SODIUM CHLORIDE 3-0.9 GM/100ML-% IV SOLN
3.0000 g | Freq: Once | INTRAVENOUS | Status: AC
  Administered 2021-01-21 (×2): 3 g via INTRAVENOUS

## 2021-01-21 MED ORDER — ACETAMINOPHEN 325 MG PO TABS: 325.0000 mg | ORAL_TABLET | ORAL | Status: DC | PRN

## 2021-01-21 MED ORDER — STERILE WATER FOR IRRIGATION IR SOLN
Status: DC | PRN
  Administered 2021-01-21: 2000 mL

## 2021-01-21 MED ORDER — CEFAZOLIN SODIUM-DEXTROSE 1-4 GM/50ML-% IV SOLN
INTRAVENOUS | Status: AC
  Filled 2021-01-21: qty 50

## 2021-01-21 MED ORDER — HYDROMORPHONE HCL 1 MG/ML IJ SOLN
INTRAMUSCULAR | Status: AC
  Administered 2021-01-21: 0.5 mg
  Filled 2021-01-21: qty 1

## 2021-01-21 MED ORDER — VANCOMYCIN HCL 1000 MG IV SOLR
INTRAVENOUS | Status: AC
  Filled 2021-01-21: qty 1000

## 2021-01-21 MED ORDER — HYDROMORPHONE HCL 2 MG/ML IJ SOLN
INTRAMUSCULAR | Status: AC
  Filled 2021-01-21: qty 1

## 2021-01-21 MED ORDER — DEXAMETHASONE SODIUM PHOSPHATE 10 MG/ML IJ SOLN
10.0000 mg | Freq: Once | INTRAMUSCULAR | Status: AC
  Administered 2021-01-22: 10 mg via INTRAVENOUS
  Filled 2021-01-21: qty 1

## 2021-01-21 MED ORDER — BUPIVACAINE-EPINEPHRINE (PF) 0.25% -1:200000 IJ SOLN
INTRAMUSCULAR | Status: AC
  Filled 2021-01-21: qty 30

## 2021-01-21 MED ORDER — SUGAMMADEX SODIUM 500 MG/5ML IV SOLN
INTRAVENOUS | Status: DC | PRN
  Administered 2021-01-21: 300 mg via INTRAVENOUS

## 2021-01-21 MED ORDER — MIDAZOLAM HCL 5 MG/5ML IJ SOLN
INTRAMUSCULAR | Status: DC | PRN
  Administered 2021-01-21: 2 mg via INTRAVENOUS

## 2021-01-21 MED ORDER — ONDANSETRON HCL 4 MG/2ML IJ SOLN: 4.0000 mg | Freq: Once | INTRAMUSCULAR | Status: DC | PRN

## 2021-01-21 MED ORDER — METOCLOPRAMIDE HCL 5 MG/ML IJ SOLN: 5.0000 mg | Freq: Three times a day (TID) | INTRAMUSCULAR | Status: DC | PRN

## 2021-01-21 MED ORDER — ROCURONIUM BROMIDE 100 MG/10ML IV SOLN
INTRAVENOUS | Status: DC | PRN
  Administered 2021-01-21: 20 mg via INTRAVENOUS
  Administered 2021-01-21: 30 mg via INTRAVENOUS
  Administered 2021-01-21: 20 mg via INTRAVENOUS
  Administered 2021-01-21: 100 mg via INTRAVENOUS

## 2021-01-21 MED ORDER — OXYCODONE HCL 5 MG/5ML PO SOLN
5.0000 mg | Freq: Once | ORAL | Status: DC | PRN
Start: 2021-01-21 — End: 2021-01-21

## 2021-01-21 MED ORDER — METOPROLOL TARTRATE 5 MG/5ML IV SOLN
INTRAVENOUS | Status: DC | PRN
  Administered 2021-01-21: 2.5 mg via INTRAVENOUS

## 2021-01-21 MED ORDER — PROPOFOL 10 MG/ML IV BOLUS
INTRAVENOUS | Status: DC | PRN
  Administered 2021-01-21: 160 mg via INTRAVENOUS

## 2021-01-21 MED ORDER — TOBRAMYCIN SULFATE 1.2 G IJ SOLR
INTRAMUSCULAR | Status: DC | PRN
  Administered 2021-01-21: 1.2 g via TOPICAL

## 2021-01-21 MED ORDER — MIDAZOLAM HCL 2 MG/2ML IJ SOLN
INTRAMUSCULAR | Status: AC
  Filled 2021-01-21: qty 2

## 2021-01-21 MED ORDER — MENTHOL 3 MG MT LOZG: 1.0000 | LOZENGE | OROMUCOSAL | Status: DC | PRN

## 2021-01-21 MED ORDER — ACETAMINOPHEN 160 MG/5ML PO SOLN: 325.0000 mg | ORAL | Status: DC | PRN

## 2021-01-21 MED ORDER — CEFAZOLIN SODIUM-DEXTROSE 2-4 GM/100ML-% IV SOLN
2.0000 g | Freq: Four times a day (QID) | INTRAVENOUS | Status: DC
  Administered 2021-01-21: 2 g via INTRAVENOUS

## 2021-01-21 MED ORDER — LIDOCAINE 2% (20 MG/ML) 5 ML SYRINGE
INTRAMUSCULAR | Status: DC | PRN
  Administered 2021-01-21: 80 mg via INTRAVENOUS

## 2021-01-21 MED ORDER — MIDAZOLAM HCL 2 MG/2ML IJ SOLN
1.0000 mg | INTRAMUSCULAR | Status: AC
Start: 2021-01-21 — End: 2021-01-21
  Administered 2021-01-21: 2 mg via INTRAVENOUS
  Filled 2021-01-21: qty 2

## 2021-01-21 MED ORDER — OXYCODONE HCL 5 MG PO TABS: 5.0000 mg | ORAL_TABLET | Freq: Once | ORAL | Status: DC | PRN

## 2021-01-21 MED ORDER — PHENYLEPHRINE 40 MCG/ML (10ML) SYRINGE FOR IV PUSH (FOR BLOOD PRESSURE SUPPORT)
PREFILLED_SYRINGE | INTRAVENOUS | Status: AC
  Filled 2021-01-21: qty 10

## 2021-01-21 MED ORDER — SODIUM CHLORIDE 0.9 % IV SOLN
10.0000 mL/h | Freq: Once | INTRAVENOUS | Status: DC
Start: 2021-01-21 — End: 2021-01-21

## 2021-01-21 SURGICAL SUPPLY — 151 items
ADH SKN CLS APL DERMABOND .7 (GAUZE/BANDAGES/DRESSINGS)
ANCH SUT 2 VT CRKSW 14.7X5.5 (Anchor) ×4 IMPLANT
ANCH SUT SWLK 19.1X4.75 (Anchor) ×8 IMPLANT
ANCH SUT SWLK 19.1X6.25 CLS (Anchor) ×2 IMPLANT
ANCHOR CORKSCREW BIO 5.5 (Anchor) ×2 IMPLANT
ANCHOR SUT BIO SW 4.75X19.1 (Anchor) ×4 IMPLANT
ANCHOR SUT SWIVELLOK BIO (Anchor) ×1 IMPLANT
APL PRP STRL LF DISP 70% ISPRP (MISCELLANEOUS) ×6
ARTICULAR SURF PSN CCK VE R 10 (Miscellaneous) ×3 IMPLANT
ARTISURF PSN CCK VE R 10 (Miscellaneous) IMPLANT
AUG FEM 11 11+ 5 STRL KN POST (Post) ×4 IMPLANT
AUG TIB MED CONE CNTR STRL LF (Joint) ×2 IMPLANT
BAG COUNTER SPONGE SURGICOUNT (BAG) ×3 IMPLANT
BAG DECANTER FOR FLEXI CONT (MISCELLANEOUS) ×6 IMPLANT
BAG SPEC THK2 15X12 ZIP CLS (MISCELLANEOUS) ×2
BAG SPNG CNTER NS LX DISP (BAG) ×2
BAG ZIPLOCK 12X15 (MISCELLANEOUS) ×3 IMPLANT
BANDAGE ESMARK 6X9 LF (GAUZE/BANDAGES/DRESSINGS) IMPLANT
BLADE SAW RECIPROCATING 77.5 (BLADE) ×3 IMPLANT
BLADE SAW SGTL 81X20 HD (BLADE) ×3 IMPLANT
BLADE SURG 15 STRL LF DISP TIS (BLADE) ×2 IMPLANT
BLADE SURG 15 STRL SS (BLADE) ×3
BLADE SURG SZ10 CARB STEEL (BLADE) ×6 IMPLANT
BNDG CMPR 9X6 STRL LF SNTH (GAUZE/BANDAGES/DRESSINGS) ×2
BNDG CMPR MED 10X6 ELC LF (GAUZE/BANDAGES/DRESSINGS) ×2
BNDG ELASTIC 4X5.8 VLCR STR LF (GAUZE/BANDAGES/DRESSINGS) ×3 IMPLANT
BNDG ELASTIC 6X10 VLCR STRL LF (GAUZE/BANDAGES/DRESSINGS) ×1 IMPLANT
BNDG ELASTIC 6X5.8 VLCR STR LF (GAUZE/BANDAGES/DRESSINGS) ×7 IMPLANT
BNDG ESMARK 6X9 LF (GAUZE/BANDAGES/DRESSINGS) ×3
CATH COUDE 5CC RIBBED (CATHETERS) IMPLANT
CATH RIBBED COUDE 5CC (CATHETERS) ×3
CEMENT BONE REFOBACIN R1X40 US (Cement) ×4 IMPLANT
CHLORAPREP W/TINT 26 (MISCELLANEOUS) ×9 IMPLANT
COMP FEM CMT PS PLUS 11 RT (Joint) ×3 IMPLANT
COMPONENT FEM CMT PS PLUS 11RT (Joint) IMPLANT
COMPONENT TIB PSN REV RT SZG (Miscellaneous) ×3 IMPLANT
COMPONET TIB PSN REV RT SZG (Miscellaneous) IMPLANT
CONE TIB CENTRAL TM MED (Joint) ×1 IMPLANT
COVER SURGICAL LIGHT HANDLE (MISCELLANEOUS) ×3 IMPLANT
CUFF TOURN SGL QUICK 34 (TOURNIQUET CUFF) ×6
CUFF TRNQT CYL 34X4.125X (TOURNIQUET CUFF) ×4 IMPLANT
DECANTER SPIKE VIAL GLASS SM (MISCELLANEOUS) IMPLANT
DERMABOND ADVANCED (GAUZE/BANDAGES/DRESSINGS)
DERMABOND ADVANCED .7 DNX12 (GAUZE/BANDAGES/DRESSINGS) ×4 IMPLANT
DRAPE EXTREMITY T 121X128X90 (DISPOSABLE) ×3 IMPLANT
DRAPE SHEET LG 3/4 BI-LAMINATE (DRAPES) ×9 IMPLANT
DRAPE U-SHAPE 47X51 STRL (DRAPES) ×6 IMPLANT
DRESSING PREVENA PLUS CUSTOM (GAUZE/BANDAGES/DRESSINGS) IMPLANT
DRSG AQUACEL AG ADV 3.5X10 (GAUZE/BANDAGES/DRESSINGS) ×4 IMPLANT
DRSG PAD ABDOMINAL 8X10 ST (GAUZE/BANDAGES/DRESSINGS) ×1 IMPLANT
DRSG PREVENA PLUS CUSTOM (GAUZE/BANDAGES/DRESSINGS) ×3
DRSG TEGADERM 4X4.75 (GAUZE/BANDAGES/DRESSINGS) ×1 IMPLANT
ELECT BLADE TIP CTD 4 INCH (ELECTRODE) ×3 IMPLANT
ELECT REM PT RETURN 15FT ADLT (MISCELLANEOUS) ×6 IMPLANT
EVACUATOR 1/8 PVC DRAIN (DRAIN) ×3 IMPLANT
EVACUATOR DRAINAGE 10X20 100CC (DRAIN) IMPLANT
EVACUATOR SILICONE 100CC (DRAIN) ×3
FIBERTAPE STERNAL CLSR 2X36 (SUTURE) ×1 IMPLANT
GAUZE SPONGE 4X4 12PLY STRL (GAUZE/BANDAGES/DRESSINGS) ×2 IMPLANT
GLOVE SRG 8 PF TXTR STRL LF DI (GLOVE) ×6 IMPLANT
GLOVE SURG ENC MOIS LTX SZ8.5 (GLOVE) ×6 IMPLANT
GLOVE SURG ENC TEXT LTX SZ7.5 (GLOVE) ×6 IMPLANT
GLOVE SURG LTX SZ8 (GLOVE) ×6 IMPLANT
GLOVE SURG UNDER POLY LF SZ8 (GLOVE) ×9
GLOVE SURG UNDER POLY LF SZ8.5 (GLOVE) ×3 IMPLANT
GOWN SPEC L3 XXLG W/TWL (GOWN DISPOSABLE) ×3 IMPLANT
GOWN STRL REUS W/ TWL LRG LVL3 (GOWN DISPOSABLE) ×2 IMPLANT
GOWN STRL REUS W/TWL LRG LVL3 (GOWN DISPOSABLE) ×3
GOWN STRL REUS W/TWL XL LVL3 (GOWN DISPOSABLE) ×6 IMPLANT
HANDPIECE INTERPULSE COAX TIP (DISPOSABLE) ×3
HDLS TROCR DRIL PIN KNEE 75 (PIN) ×3
HOLDER FOLEY CATH W/STRAP (MISCELLANEOUS) IMPLANT
HOOD PEEL AWAY FLYTE STAYCOOL (MISCELLANEOUS) ×12 IMPLANT
IMMOBILIZER KNEE 22  40 CIR (ORTHOPEDIC SUPPLIES)
IMMOBILIZER KNEE 22 40 CIR (ORTHOPEDIC SUPPLIES) IMPLANT
IMMOBILIZER KNEE 22 UNIV (SOFTGOODS) IMPLANT
INSTR SCRW HEX REV FIX 3.5X48 (ORTHOPEDIC DISPOSABLE SUPPLIES) ×3
INSTRUMENT SCRW HEX REV 3.5X48 (ORTHOPEDIC DISPOSABLE SUPPLIES) IMPLANT
JET LAVAGE IRRISEPT WOUND (IRRIGATION / IRRIGATOR)
KIT ASCP FXDISP 3X8XBTNDS (KITS) IMPLANT
KIT BASIN OR (CUSTOM PROCEDURE TRAY) ×3 IMPLANT
KIT BIO-TENODESIS 3X8 DISP (KITS)
KIT TURNOVER KIT A (KITS) ×3 IMPLANT
LAVAGE JET IRRISEPT WOUND (IRRIGATION / IRRIGATOR) IMPLANT
MANIFOLD NEPTUNE II (INSTRUMENTS) ×3 IMPLANT
MARKER SKIN DUAL TIP RULER LAB (MISCELLANEOUS) ×3 IMPLANT
NDL MAYO 6 CRC TAPER PT (NEEDLE) IMPLANT
NDL MAYO CATGUT SZ4 TPR NDL (NEEDLE) ×2 IMPLANT
NDL SPNL 18GX3.5 QUINCKE PK (NEEDLE) ×2 IMPLANT
NEEDLE MAYO 6 CRC TAPER PT (NEEDLE) IMPLANT
NEEDLE MAYO CATGUT SZ4 (NEEDLE) ×3 IMPLANT
NEEDLE SPNL 18GX3.5 QUINCKE PK (NEEDLE) ×3 IMPLANT
NS IRRIG 1000ML POUR BTL (IV SOLUTION) ×6 IMPLANT
PACK TOTAL JOINT (CUSTOM PROCEDURE TRAY) ×3 IMPLANT
PACK TOTAL KNEE CUSTOM (KITS) ×3 IMPLANT
PAD CAST 4YDX4 CTTN HI CHSV (CAST SUPPLIES) IMPLANT
PADDING CAST ABS 6INX4YD NS (CAST SUPPLIES) ×1
PADDING CAST ABS COTTON 6X4 NS (CAST SUPPLIES) IMPLANT
PADDING CAST COTTON 4X4 STRL (CAST SUPPLIES) ×3
PADDING CAST COTTON 6X4 STRL (CAST SUPPLIES) ×1 IMPLANT
PENCIL SMOKE EVACUATOR (MISCELLANEOUS) ×3 IMPLANT
PIN DRILL HDLS TROCAR 75 4PK (PIN) IMPLANT
POST FEM AUG PS 11X5 (Post) ×2 IMPLANT
PROTECTOR NERVE ULNAR (MISCELLANEOUS) ×3 IMPLANT
RETRIEVER SUT HEWSON (MISCELLANEOUS) ×3 IMPLANT
SAW OSC TIP CART 19.5X105X1.3 (SAW) ×3 IMPLANT
SEALER BIPOLAR AQUA 6.0 (INSTRUMENTS) ×3 IMPLANT
SET HNDPC FAN SPRY TIP SCT (DISPOSABLE) ×2 IMPLANT
SET PAD KNEE POSITIONER (MISCELLANEOUS) ×3 IMPLANT
SPLINT PLASTER CAST XFAST 5X30 (CAST SUPPLIES) IMPLANT
SPLINT PLASTER XFAST SET 5X30 (CAST SUPPLIES) ×1
SPONGE DRAIN TRACH 4X4 STRL 2S (GAUZE/BANDAGES/DRESSINGS) ×3 IMPLANT
SPONGE T-LAP 18X18 ~~LOC~~+RFID (SPONGE) ×3 IMPLANT
STEM FEM OFFSET 6 EXT 16X135 (Miscellaneous) ×1 IMPLANT
STEM REV STRT PERS 22X135 (Stem) ×1 IMPLANT
STRIP CLOSURE SKIN 1/2X4 (GAUZE/BANDAGES/DRESSINGS) ×2 IMPLANT
SUCTION FRAZIER HANDLE 10FR (MISCELLANEOUS)
SUCTION TUBE FRAZIER 10FR DISP (MISCELLANEOUS) IMPLANT
SUT ETHILON 4 0 PS 2 18 (SUTURE) ×3 IMPLANT
SUT FIBERWIRE #2 38 REV NDL BL (SUTURE) ×6
SUT FIBERWIRE #5 38 CONV NDL (SUTURE)
SUT MNCRL AB 3-0 PS2 18 (SUTURE) ×3 IMPLANT
SUT MNCRL AB 4-0 PS2 18 (SUTURE) ×3 IMPLANT
SUT MON AB 2-0 CT1 36 (SUTURE) ×6 IMPLANT
SUT STRATAFIX PDO 1 14 VIOLET (SUTURE) ×3
SUT STRATFX PDO 1 14 VIOLET (SUTURE) ×2
SUT VIC AB 0 CT1 18XCR BRD8 (SUTURE) ×2 IMPLANT
SUT VIC AB 0 CT1 27 (SUTURE)
SUT VIC AB 0 CT1 27XBRD ANBCTR (SUTURE) IMPLANT
SUT VIC AB 0 CT1 8-18 (SUTURE) ×3
SUT VIC AB 1 CTX 18 (SUTURE) ×3 IMPLANT
SUT VIC AB 1 CTX 36 (SUTURE) ×6
SUT VIC AB 1 CTX36XBRD ANBCTR (SUTURE) ×4 IMPLANT
SUT VIC AB 2-0 CT1 27 (SUTURE) ×6
SUT VIC AB 2-0 CT1 TAPERPNT 27 (SUTURE) ×4 IMPLANT
SUT VIC AB 3-0 CT1 27 (SUTURE) ×3
SUT VIC AB 3-0 CT1 TAPERPNT 27 (SUTURE) ×2 IMPLANT
SUT VIC AB 3-0 SH 27 (SUTURE)
SUT VIC AB 3-0 SH 27X BRD (SUTURE) IMPLANT
SUTURE FIBERWR #5 38 CONV NDL (SUTURE) IMPLANT
SUTURE FIBERWR#2 38 REV NDL BL (SUTURE) IMPLANT
SUTURE STRATFX PDO 1 14 VIOLET (SUTURE) ×2 IMPLANT
SUTURE TAPE 1.3 FIBERLOP 20 ST (SUTURE) IMPLANT
SUTURETAPE 1.3 FIBERLOOP 20 ST (SUTURE) ×6
SYR BULB EAR ULCER 3OZ GRN STR (SYRINGE) ×3 IMPLANT
TOWEL OR 17X26 10 PK STRL BLUE (TOWEL DISPOSABLE) ×3 IMPLANT
TOWER CARTRIDGE SMART MIX (DISPOSABLE) ×6 IMPLANT
TRAY FOLEY MTR SLVR 16FR STAT (SET/KITS/TRAYS/PACK) ×3 IMPLANT
TUBE SUCTION HIGH CAP CLEAR NV (SUCTIONS) ×3 IMPLANT
WATER STERILE IRR 1000ML POUR (IV SOLUTION) ×3 IMPLANT
WRAP KNEE MAXI GEL POST OP (GAUZE/BANDAGES/DRESSINGS) ×3 IMPLANT

## 2021-01-21 NOTE — Op Note (Signed)
Orthopaedic Surgery Operative Note (CSN: [id number])  Harry [name]  [birth year] Date of Surgery: 01/19/2021 - 01/21/2021   Diagnoses:  Intraoperative consult for right extensor mechanism rupture and medial lateral retinacular ruptures  Procedure: Right patellar tendon repair Right patellar tendon internal bracing Right patellar tendon graft augmentation and reconstruction Medial retinacular repair Lateral retinacular repair   Operative Finding Successful completion of the planned procedure.  Patient had a complete avulsion of the patellar tendon with the medial two thirds coming off the tibial tubercle and the lateral one third coming off the patella.  Additionally there was a knee dislocation in setting of total knee arthroplasty.  This was an intraoperative consult and we were asked to try and help address this emergent issue.  We were able to perform a repair of the native tissue of the extensor mechanism though the tissue itself was relatively poor.  We augmented this with internal bracing as well as hamstrings that were left attached the pes insertion.  We we will defer to Dr. Lyla Glassing on overall management of the total knee arthroplasty and would recommend 6 weeks of immobilization prior to starting any range of motion.  The intact extensor mechanism was easily stable to 45 degrees of flexion before the hamstring augmentation was performed.  Post-operative plan: The patient will be nonweightbearing in a splint and transition to a cast per the plan per Dr. Lyla Glassing.  The patient will be readmitted to the floor.  DVT prophylaxis per Dr. Lyla Glassing.  Follow-up would be at Dr. Sid Falcon discretion.  Post-Op Diagnosis: Same Surgeons:Primary: Rod Can, MD, co-surgeon Ophelia Charter MD Assistants:Caroline McBane PA-C Location: [facility] Anesthesia: General with regional anesthesia Antibiotics:  Ancef, vancomycin 1 g local, 1.2 g tobramycin local. Tourniquet time: * No tourniquets  in log * Estimated Blood Loss: Per Dr. Sid Falcon note, minimal additional blood loss for my portion of the surgery Complications: None Specimens: None Implants: Implant Name Type Inv. Item Serial No. Manufacturer Lot No. LRB No. Used Action  COMPONENT TIB PSN REV RT SZG - [id number] Miscellaneous COMPONENT TIB PSN REV RT SZG  ZIMMER [employer]ON(ORTH,TRAU,BIO,SG) [id number] Right 1 Implanted  Persona Revision Femoral Posterior Augment     [id number] Right 1 Implanted  Persona Revision Femur Cemented     [id number] Right 1 Implanted  Persona Revision Femoral Posterior Augment Cemented     [id number] Right 1 Implanted  STEM REV STRT PERS 22X135 - [id number] Stem STEM REV STRT PERS 22X135  [employer](ORTH,TRAU,BIO,SG) [id number] Right 1 Implanted  Persona Revision Trabecular Metal Tibial Central Cone size Medium     [id number] Right 1 Implanted  STEM FEM OFFSET 6 EXT [id number] - [id number] Miscellaneous STEM FEM OFFSET 6 EXT [id number]  [employer](ORTH,TRAU,BIO,SG) [id number] Right 1 Implanted  CEMENT BONE REFOBACIN R1X40 [place] - [id number] Cement CEMENT BONE REFOBACIN R1X40 [place]  ZIMMER [employer]ON(ORTH,TRAU,BIO,SG) [id number] Right 2 Implanted  CEMENT BONE REFOBACIN R1X40 [place] - [id number] Cement CEMENT BONE REFOBACIN R1X40 [place]  ZIMMER [employer]ON(ORTH,TRAU,BIO,SG) [id number] Right 1 Implanted  CEMENT BONE REFOBACIN R1X40 [place] - [id number] Cement CEMENT BONE REFOBACIN R1X40 [place]  ZIMMER RECON(ORTH,TRAU,BIO,SG) [id number] Right 1 Implanted  ARTICULAR SURF PSN CCK VE R 10 - [id number] Miscellaneous ARTICULAR SURF PSN CCK VE R 10  ZIMMER [employer]ON(ORTH,TRAU,BIO,SG) [id number] Right 1 Implanted  ANCHOR SUT BIO SW 4.75X19.1 - [id number] Anchor ANCHOR SUT BIO SW 4.75X19.1  [employer] [id number] Right 1 Implanted  ANCHOR SUT BIO SW 4.75X19.1 - [id number] Anchor ANCHOR SUT BIO SW 4.75X19.1  [employer] [id number] Right 1 Implanted  ANCHOR CORKSCREW BIO 5.5MM - [id number] Anchor ANCHOR CORKSCREW BIO 5.5MM  [employer] [id number] Right 1 Implanted  ANCHOR CORKSCREW BIO  5.5MM - [id number] Anchor ANCHOR CORKSCREW BIO 5.5MM  [employer] [id number] Right 1 Implanted  ANCHOR SUT BIO SW 4.75X19.1 - [id number] Anchor ANCHOR SUT BIO SW 4.75X19.1  [employer] [id number] Right 1 Implanted  ANCHOR SUT BIO SW 4.75X19.1 - [id number] Anchor ANCHOR SUT BIO SW 4.75X19.1  [name] [id number] Right 1 Implanted  [address] BIO - [id number] Anchor ANCHOR [employer] [id number] Right 1 Implanted    Indications for Surgery:   Harry [name] is a 70 y.o. male with total knee arthroplasty performed recently who had a fall and a knee dislocation.  He then had a subsequent knee dislocation while in the hospital after x-ray demonstrated reduction.  I was called emergently to the operating room as the patient had a extensor mechanism rupture as well as a medial collateral ligament injury.  We were not able to talk about risk benefits with the patient specifically due to the intraoperative findings warranting my participation.   Procedure:   This was an intraoperative consult thus I was not able to talk to the patient before surgery.  We identified that this was the appropriate side and confirmed with Dr. Lyla Glassing before proceeding.  I began with the knee open a new revision implants in place.  We were able to note that the medial lateral retinaculum were completely avulsed.  The medial side was almost bare and without any obvious MCL remnant.  The patellar tendon on the lateral third was avulsed off the patella and the medial two thirds was avulsed off the tibial tubercle.  There was overall poor tissue quality.  The tendon itself had intrasubstance splitting consistent with acute trauma.  We debrided unhealthy appearing tendon and placed a swivel lock 4.75 bio composite anchor after drilling and tapping in the inferior central portion of the patella.  We used the safety stitches from this anchor to repair the lateral side of the patellar tendon to the patella itself.   We had placed a fiber tape within this anchor for internal bracing.  Once this portion of the tendon was repaired we went to the tibial tubercle and sure that we are in appropriate alignment to prevent patellar maltracking.  We then found the bed from which the patellar tendon had ruptured and placed 2 5.5 mm bio composite corkscrew anchors loaded with #2 FiberWire, double loaded.  This left [place] with 4 total sutures for repair.  We passed 1 limb of each suture into the remaining tendon that had avulsed off the tibial tubercle spacing these out appropriately and using each limb and locking Krakw fashion to obtain purchase of the tendon.  We then passed the corresponding limbs through the tendon and were able to tie alternating half hitches securing the tendon down to bone and compressing the soft tissues to the bone placing the knots superficial.  At this point we are able to take 1 limb of each of the sutures as well as the fiber tape that we had placed for internal bracing and placed them into a 4.75 mm swivel lock placed 8 to 10 mm distal to the corkscrew anchors performing a double row type repair increasing the surface area for contact for the patellar tendon and internally bracing a repair taking care not to over tension.  Each of these was drilled and tapped prior to placing these  anchors.  At this point we harvested the gracilis and the semitendinosus while leaving them attached at the pes using an open tendon harvester.  We ensured that we had full length.  Each of the sutures was whipstitch with a fiber loop suture.  We then took the gracilis to the lateral side of the patella going through the substance of the patellar tendon bluntly using a hemostat.  We then were able to pass the semitendinosis from lateral to medial through 3 stab incisions within the quadriceps tendon at the tendon bone junction.  This allowed interdigitation of the semi-T within the quad providing a graft to assist in the  biologic load of the extensor mechanism.  We similarly passed the gracilis from lateral to medial through 3 incisions.  This was very similar to a Pulvertaft weave type passage.  At this point we had the semi-t tendon now on the medial side of the patella and the gracilis on the lateral.  gracilis only had enough length to tie to the distal soft tissues itself and we reinforced this with #2 FiberWire in figure-of-eight fashion incorporating this into our patellar tendon.  The semitendinosus was long enough to incorporate into a 7 mm bone tunnel after sizing.  We drilled a 20 mm deep 7 mm wide bone tunnel and placed a 6.25 swivel lock incorporating the tendon into this bony tunnel.  All of our anchors took care not to interfere with the deep implants that we did have her pins touch this area with each passage to ensure that we had appropriate depth.  This point we had a robust repair and reconstruction of the patellar tendon.  We took an extensive amount of time to irrigate and then closed the medial retinaculum with a series of 8-10 interrupted sutures in the form of a #1 Vicryl.  We then similarly did the same repair on the lateral retinaculum.  These were extensive portions of the case and required further time and effort.  We irrigated again and placed Irrisept solution before local antibiotics were placed deep on the implants and our incision.  We then turned the case back over to Dr. Lyla Glassing for his closure.  Please see his note for further details.  This was a complex case that required to fellowship trained surgeons in order to appropriately handle the patient's problem.  The assistance of multiple PAs was necessary for each individual portion of the case as Noemi Chapel PA-C was available and integral to my portion of the case.  We essentially handed off the case between our portions of the surgery.

## 2021-01-21 NOTE — Progress Notes (Signed)
AssistedDr. Oddono with right, ultrasound guided, adductor canal block. Side rails up, monitors on throughout procedure. See vital signs in flow sheet. Tolerated Procedure well.  

## 2021-01-21 NOTE — Anesthesia Procedure Notes (Addendum)
  Anesthesia Regional Block: Adductor canal block   Pre-Anesthetic Checklist: , timeout performed,  Correct Patient, Correct Site, Correct Laterality,  Correct Procedure, Correct Position, site marked,  Risks and benefits discussed,  Surgical consent,  Pre-op evaluation,  At surgeon's request and post-op pain management  Laterality: Right  Prep: chloraprep       Needles:  Injection technique: Single-shot  Needle Type: Echogenic Stimulator Needle     Needle Length: 5cm  Needle Gauge: 22     Additional Needles:   Procedures:, nerve stimulator,,, ultrasound used (permanent image in chart),,    Narrative:  Start time: 01/21/2021 11:15 AM End time: 01/21/2021 11:23 AM Injection made incrementally with aspirations every 5 mL.  Performed by: Personally  Anesthesiologist: Janeece Riggers, MD  Additional Notes: Functioning IV was confirmed and monitors were applied.  A 17m 22ga Arrow echogenic stimulator needle was used. Sterile prep and drape,hand hygiene and sterile gloves were used. Ultrasound guidance: relevant anatomy identified, needle position confirmed, local anesthetic spread visualized around nerve(s)., vascular puncture avoided.  Image printed for medical record. Negative aspiration and negative test dose prior to incremental administration of local anesthetic. The patient tolerated the procedure well.

## 2021-01-21 NOTE — Anesthesia Procedure Notes (Signed)
Procedure Name: Intubation Date/Time: 01/21/2021 11:50 AM Performed by: Gwyndolyn Saxon, CRNA Pre-anesthesia Checklist: Patient identified, Emergency Drugs available, Suction available and Patient being monitored Patient Re-evaluated:Patient Re-evaluated prior to induction Oxygen Delivery Method: Circle system utilized Preoxygenation: Pre-oxygenation with 100% oxygen Induction Type: IV induction Ventilation: Mask ventilation without difficulty Laryngoscope Size: Miller and 3 Grade View: Grade I Tube size: 8.0 mm Number of attempts: 1 Airway Equipment and Method: Patient positioned with wedge pillow and Stylet Placement Confirmation: ETT inserted through vocal cords under direct vision, positive ETCO2 and breath sounds checked- equal and bilateral Secured at: 25 cm Tube secured with: Tape Dental Injury: Teeth and Oropharynx as per pre-operative assessment

## 2021-01-21 NOTE — Anesthesia Preprocedure Evaluation (Addendum)
Anesthesia Evaluation  Patient identified by MRN, date of birth, ID band Patient awake    Reviewed: Allergy & Precautions, H&P , NPO status , Patient's Chart, lab work & pertinent test results  Airway Mallampati: I  TM Distance: >3 FB Neck ROM: Full    Dental no notable dental hx. (+) Edentulous Upper, Partial Lower, Dental Advisory Given   Pulmonary neg pulmonary ROS, former smoker,    Pulmonary exam normal breath sounds clear to auscultation       Cardiovascular hypertension, Pt. on medications  Rhythm:Regular Rate:Normal  EKG 12/26/20: Sinus rhythm with frequent PVCs in a pattern of bigeminy Left anterior fasicular block Anterior infarct , age undetermined   Neuro/Psych negative neurological ROS  negative psych ROS   GI/Hepatic Neg liver ROS, GERD  Medicated,  Endo/Other  Morbid obesity  Renal/GU negative Renal ROS  negative genitourinary   Musculoskeletal  (+) Arthritis , Osteoarthritis,    Abdominal   Peds  Hematology negative hematology ROS (+)   Anesthesia Other Findings   Reproductive/Obstetrics negative OB ROS                            Anesthesia Physical  Anesthesia Plan  ASA: 3  Anesthesia Plan: Regional and General   Post-op Pain Management: GA combined w/ Regional for post-op pain   Induction: Intravenous  PONV Risk Score and Plan: 3 and Propofol infusion and Midazolam  Airway Management Planned: Oral ETT and LMA  Additional Equipment: None  Intra-op Plan:   Post-operative Plan:   Informed Consent: I have reviewed the patients History and Physical, chart, labs and discussed the procedure including the risks, benefits and alternatives for the proposed anesthesia with the patient or authorized representative who has indicated his/her understanding and acceptance.     Dental advisory given  Plan Discussed with: CRNA and Anesthesiologist  Anesthesia Plan  Comments:       Anesthesia Quick Evaluation

## 2021-01-21 NOTE — Transfer of Care (Signed)
Immediate Anesthesia Transfer of Care Note  Patient: Harry [name]  Procedure(s) Performed: TOTAL KNEE REVISION BOTH COMPONENTS (Right: Knee) PATELLA TENDON REPAIR (Right)  Patient Location: PACU  Anesthesia Type:GA combined with regional for post-op pain  Level of Consciousness: pateint uncooperative  Airway & Oxygen Therapy: Patient Spontanous Breathing and Patient connected to face mask oxygen  Post-op Assessment: Report given to RN and Post -op Vital signs reviewed and stable  Post vital signs: Reviewed and stable  Last Vitals:  Vitals Value Taken Time  BP 92/56 01/21/21 1818  Temp    Pulse 70 01/21/21 1824  Resp 14 01/21/21 1824  SpO2 96 % 01/21/21 1824  Vitals shown include unvalidated device data.  Last Pain:  Vitals:   01/21/21 1037  TempSrc:   PainSc: 2       Patients Stated Pain Goal: 2 (0000000 [id number])  Complications: No notable events documented.

## 2021-01-21 NOTE — Interval H&P Note (Signed)
History and Physical Interval Note:  01/21/2021 10:37 AM  Harry [name]  has presented today for surgery, with the diagnosis of PATELLA TENDON RUPTURE.  The various methods of treatment have been discussed with the patient and family. After consideration of risks, benefits and other options for treatment, the patient has consented to  Procedure(s): TOTAL KNEE POLY SWAP VS. REVISION BOTH COMPONENTS (Right) PATELLA TENDON REPAIR (Right) as a surgical intervention.  The patient's history has been reviewed, patient examined, no change in status, stable for surgery.  I have reviewed the patient's chart and labs.  Questions were answered to the patient's satisfaction.     Hilton Cork Vernadine Coombs

## 2021-01-21 NOTE — Op Note (Signed)
OPERATIVE REPORT   01/21/2021  2:43 PM  PATIENT:  Harry [name]   SURGEON:  Bertram Savin, MD  ASSISTANT:  Nehemiah Massed, PA-C.  Ophelia Charter, MD for extensor mechanism reconstruction.  PREOPERATIVE DIAGNOSIS: 1. Extensor mechanism disruption, right knee. 2. MCL disruption right knee.  POSTOPERATIVE DIAGNOSIS:  Same.  PROCEDURE:  1. Revision right total knee arthroplasty, femoral and tibial components. 2. Repair of extensor mechanism right knee by Dr. Griffin Basil. 3. Application of negative pressure incisional dressing. 4. Application of long leg plaster splint.  ANESTHESIA:   GETA.  ANTIBIOTICS:  3 g Ancef. 1500 mg Vancomycin.  EXPLANTS: 1. DePuy PS femur. 2. DePuy fixed bearing tibial tray. 3. PS poly insert.  IMPLANTS:  1. Zimmer Persona Fixed bearing tibia, size G, with 16 x 135 mm splined stem, 6 mm offset. 2. Size medium central tibial cone. 3. Persona Size 11 + Femoral component with 5 mm posterior augments x 2 and 22 x 135 mm splined stem. 4. Vivacit-E 10 mm CCK tibial bearing.  SPECIMENS:  None.  COMPLICATIONS:  None.  DISPOSITION:  Stable to PACU.  SURGICAL INDICATIONS:  Harry [name] is a 70 y.o. male with history of previous open knee surgery remotely who underwent primary right total knee arthroplasty 2 weeks ago.  Patient was doing well until he was on stairs during a physical therapy session earlier this week, and the knee buckled.  He had pain and inability to weight-bear.  He was brought to the emergency department at [facility], where x-rays revealed dislocated total knee arthroplasty and patella alta.  He underwent closed reduction of the right knee in the emergency department with placement of knee immobilizer.  He dislocated last night in the knee immobilizer while shifting in bed.  He was indicated for right knee polyliner exchange versus revision total knee arthroplasty and extensor mechanism repair.  We discussed the risk, benefits,  and alternatives.  He understands that extensor mechanism disruption after total knee replacement is a very challenging injury.  The risks, benefits, and alternatives were discussed with the patient. There are risks associated with the surgery including, but not limited to, problems with anesthesia (death), infection, instability (giving out of the joint), dislocation, differences in leg length/angulation/rotation, fracture of bones, loosening or failure of implants, hematoma (blood accumulation) which may require surgical drainage, blood clots, pulmonary embolism, nerve injury (foot drop and lateral thigh numbness), and blood vessel injury. The patient understands these risks and elects to proceed.   PROCEDURE IN DETAIL: The patient was identified in the holding area using 2 identifiers.  The surgical site was marked by myself.  He was taken to the operating room, and placed supine on the operating room table.  General anesthesia was induced.  A 14 French coud catheter was placed.  Surgical staples were removed.  The right lower extremity was prepped and draped in the normal sterile surgical fashion.  Timeout was called, verifying site and site of surgery.  I began by examining the knee.  He had superficial skin blistering to the medial skin flap.  #10 blade was used to excise his previous skin incision.  Full-thickness skin flaps were created.  Patient had significant degloving on the medial aspect of the knee proceeding past the medial border of the tibia.  He had extensive medial and lateral retinacular tearing.  There was complete avulsion of his medial capsular structures including the MCL.  The patellar tendon was torn off of the tibial tubercle.  Given  these findings, decision was made to revise him to a CCK implant.  Previous sutures were cut, and medial parapatellar arthrotomy was made.  ACL sawblade was used to disrupt the interface between the cement and implant of the femoral and tibial  components.  Flexible osteotomes were then used as well.  A Cobb elevator was used to remove his polyliner which was intact without any damage.  A bone tamp was used to remove the femoral component without any significant bone loss.  Bone tamp was then used to remove the tibial component without any significant bone loss.  The knee was then flexed and held in the [facility] positioner.  Entry drill was used to gain access to the intramedullary canal of the distal femur and proximal tibia.  I then planned for 135 mm length stems.  Sequential reaming was performed up to a size 16 mm reamer on the tibia and size 22 mm reamer on the femur.  Using an intramedullary guide, cleanup cut of the proximal tibia was made.  The tibia was sized to a G.  I determined 6 mm offset would provide the best coverage of the tibial component.  I then prepped the proximal tibia for a medium tibial cone.  Trial tibial implant was constructed and placed into the proximal tibia.  I then performed a distal cleanup cut of the distal femur.  4-in-1 cutting guide was used.  I determined that 5 mm posterior augments would be needed both medially and laterally.  Trial femoral component was assembled and inserted.  10 mm CCK trial bearing was placed.  He was able to achieve full extension.  The knee was stable to varus and valgus force throughout the range of motion.  I inspected his patellar component which was well fixed.  The trial components were removed.  The real components were opened and assembled on the back table.  The cut bony surfaces were irrigated with pulse lavage and dried with lap sponges.  Cement was mixed on the back table.  The tibial cone was placed.  Using hybrid fixation technique, I impacted the real tibial component followed by the real femoral component.  Excess cement was cleared.  The trial insert was placed and the knee was brought into extension while the cement polymerized.  Once the cement was fully hardened, I placed  the real 10 mm CCK bearing.  Repeat stability testing was performed and found to be excellent.  At this point, Dr. Griffin Basil scrubbed and proceeded with reconstructing the extensor mechanism.  Please see his separate dictated operative report for further details.  In short, he repaired the patellar tendon to the tibial tubercle using suture anchors.  He then harvested hamstring autograft to backup the repair.    The wound was then copiously irrigated with Irrisept solution followed by normal saline.  Vancomycin and tobramycin powder were placed in the knee joint.  The arthrotomy and retinacular tears were repaired with #1 Vicryl suture.  A stab incision was made proximally in line with the incision with a 10 blade, and a 10 mm flat JP drain was placed in the subcutaneous tissues on the medial aspect of the knee.  Deep dermal closure was made with 2-0 Monocryl sutures.  The skin was reapproximated with 2-0 nylon sutures using vertical mattress technique.  Customizable Prevena dressing was placed and the suction was hooked up to 125 mmHg.  There was excellent seal without any leak.  At this point, a well-padded, well molded long-leg posterior splint  was placed.  Medial and lateral plaster slabs were placed about the knee.  Ace wrap was applied.  The patient was then extubated, and taken to the PACU in stable condition.  Sponge, needle, and instrument counts were correct at the end of the case x2.  There were no known complications.  Pedal pulses were palpable before and after the case.  POSTOPERATIVE PLAN: Postoperatively, the patient will be admitted to stepdown for overnight observation.  Touchdown weightbearing right lower extremity.  We will plan on keeping him in full extension without bending the knee for 6 weeks.  He will receive standard IV antibiotics for prophylaxis.  We will monitor his JP drain output, and DC the drain when output is less than 30 cc per shift.  We will put him on Lovenox for DVT  prophylaxis beginning tomorrow morning.  Mobilize out of bed with PT/OT.  He will undergo disposition planning.  Upon discharge, we will switch the house VAC suction unit to a portable Prevena suction unit.  He will need to return to the office within 7 days of discharge for removal of the incisional wound VAC and placement of cylinder cast.

## 2021-01-22 LAB — CBC
HCT: 27.8 % — ABNORMAL LOW (ref 39.0–52.0)
Hemoglobin: 9 g/dL — ABNORMAL LOW (ref 13.0–17.0)
MCH: 30.1 pg (ref 26.0–34.0)
MCHC: 32.4 g/dL (ref 30.0–36.0)
MCV: 93 fL (ref 80.0–100.0)
Platelets: 191 10*3/uL (ref 150–400)
RBC: 2.99 MIL/uL — ABNORMAL LOW (ref 4.22–5.81)
RDW: 15.9 % — ABNORMAL HIGH (ref 11.5–15.5)
WBC: 20 10*3/uL — ABNORMAL HIGH (ref 4.0–10.5)
nRBC: 0 % (ref 0.0–0.2)

## 2021-01-22 LAB — BASIC METABOLIC PANEL
Anion gap: 9 (ref 5–15)
BUN: 28 mg/dL — ABNORMAL HIGH (ref 8–23)
CO2: 24 mmol/L (ref 22–32)
Calcium: 7.8 mg/dL — ABNORMAL LOW (ref 8.9–10.3)
Chloride: 103 mmol/L (ref 98–111)
Creatinine, Ser: 1.37 mg/dL — ABNORMAL HIGH (ref 0.61–1.24)
GFR, Estimated: 56 mL/min — ABNORMAL LOW (ref >60)
Glucose, Bld: 127 mg/dL — ABNORMAL HIGH (ref 70–99)
Potassium: 5 mmol/L (ref 3.5–5.1)
Sodium: 136 mmol/L (ref 135–145)

## 2021-01-22 LAB — CREATININE, SERUM
Creatinine, Ser: 1.39 mg/dL — ABNORMAL HIGH (ref 0.61–1.24)
GFR, Estimated: 55 mL/min — ABNORMAL LOW (ref >60)

## 2021-01-22 LAB — MRSA NEXT GEN BY PCR, NASAL: MRSA by PCR Next Gen: NOT DETECTED

## 2021-01-22 MED ORDER — CHLORHEXIDINE GLUCONATE CLOTH 2 % EX PADS
6.0000 | MEDICATED_PAD | Freq: Every day | CUTANEOUS | Status: DC
  Administered 2021-01-22: 6 via TOPICAL

## 2021-01-22 MED ORDER — METHOCARBAMOL 500 MG PO TABS
500.0000 mg | ORAL_TABLET | Freq: Four times a day (QID) | ORAL | Status: DC | PRN
  Administered 2021-01-22 – 2021-01-27 (×18): 500 mg via ORAL
  Filled 2021-01-22 (×18): qty 1

## 2021-01-22 MED ORDER — CEFAZOLIN SODIUM-DEXTROSE 2-4 GM/100ML-% IV SOLN
2.0000 g | Freq: Once | INTRAVENOUS | Status: AC
  Administered 2021-01-22: 2 g via INTRAVENOUS
  Filled 2021-01-22: qty 100

## 2021-01-22 NOTE — Progress Notes (Signed)
    Subjective:  Patient reports pain as mild to moderate.  Denies N/V/CP/SOB. No c/o  Objective:   VITALS:   Vitals:   01/22/21 0630 01/22/21 0717 01/22/21 0730 01/22/21 0800  BP: 120/69 132/83 (!) 115/92   Pulse: 91 91 90   Resp: '19 18 14   '$ Temp:    98.5 F (36.9 C)  TempSrc:    Oral  SpO2: 95% 96% 93%   Weight:      Height:       JP 240 cc   NAD Sensation intact distally Intact pulses distally Incision: dressing C/D/I Wiggles toes Incisional VAC intact JP ss   Lab Results  Component Value Date   WBC 20.0 (H) 01/22/2021   HGB 9.0 (L) 01/22/2021   HCT 27.8 (L) 01/22/2021   MCV 93.0 01/22/2021   PLT 191 01/22/2021   BMET    Component Value Date/Time   NA 136 01/22/2021 0300   K 5.0 01/22/2021 0300   CL 103 01/22/2021 0300   CO2 24 01/22/2021 0300   GLUCOSE 127 (H) 01/22/2021 0300   BUN 28 (H) 01/22/2021 0300   CREATININE 1.37 (H) 01/22/2021 0300   CREATININE 1.39 (H) 01/22/2021 0300   CALCIUM 7.8 (L) 01/22/2021 0300   GFRNONAA 56 (L) 01/22/2021 0300   GFRNONAA 55 (L) 01/22/2021 0300   GFRAA 88 (L) 05/15/2012 1005     Assessment/Plan: 1 Day Post-Op   Active Problems:   Knee dislocation, right, initial encounter   25% WB RLE with walker Ice, elevation DO NOT BEND KNEE DVT ppx: Lovenox, SCDs, TEDS PO pain control PT/OT Dispo: xfer to ortho floor, SNF placement, will convert house VAC to portable Prevena upon [place] 01/22/2021, 9:57 AM   Rod Can, MD [phone] [place] is now [facility]  [facility] [address], [place], [place] [address] Phone: [phone] [email or web address] Facebook  [employer]

## 2021-01-22 NOTE — TOC Initial Note (Signed)
Transition of Care [facility]) - Initial/Assessment Note    Patient Details  Name: Harry [name] MRN: [id number] Date of Birth: 07/07/1950  Transition of Care [facility]) CM/SW Contact:    Trish Mage, LCSW Phone Number: 01/22/2021, 4:16 PM  Clinical Narrative:   Patient seen in follow up to PT SNF recommendation.  Harry [name] concurs, states he knows he cannot go home like this.  He lives on the [facility], and is open to going either way for rehab.  I explained bed search and insurance authorization process. Bed search initiated. TOC will continue to follow during the course of hospitalization.                Expected Discharge Plan: Skilled Nursing Facility Barriers to Discharge: SNF Pending bed offer   Patient Goals and CMS Choice     Choice offered to / list presented to : Patient  Expected Discharge Plan and Services Expected Discharge Plan: [place]   Discharge Planning Services: CM Consult Post Acute Care Choice: [facility] Living arrangements for the past 2 months: Single Family Home                                      Prior Living Arrangements/Services Living arrangements for the past 2 months: Single Family Home Lives with:: Spouse Patient language and need for interpreter reviewed:: Yes        Need for Family Participation in Patient Care: Yes (Comment) Care giver support system in place?: Yes (comment) Current home services: DME Criminal Activity/Legal Involvement Pertinent to Current Situation/Hospitalization: No - Comment as needed  Activities of Daily Living Home Assistive Devices/Equipment: Eyeglasses, Cane (specify quad or straight), Blood pressure cuff, Hand-held shower hose (Straight cane) ADL Screening (condition at time of admission) Patient's cognitive ability adequate to safely complete daily activities?: Yes Is the patient deaf or have difficulty hearing?: No Does the patient have  difficulty seeing, even when wearing glasses/contacts?: No Does the patient have difficulty concentrating, remembering, or making decisions?: No Patient able to express need for assistance with ADLs?: Yes Does the patient have difficulty dressing or bathing?: Yes Independently performs ADLs?: No Communication: Independent Dressing (OT): Needs assistance Is this a change from baseline?: Pre-admission baseline Grooming: Independent Feeding: Independent Bathing: Needs assistance Is this a change from baseline?: Pre-admission baseline Toileting: Needs assistance Is this a change from baseline?: Pre-admission baseline In/Out Bed: Needs assistance Is this a change from baseline?: Pre-admission baseline Walks in Home: Dependent Is this a change from baseline?: Change from baseline, expected to last >3 days Does the patient have difficulty walking or climbing stairs?: Yes (Due to knee pain) Weakness of Legs: Right Weakness of Arms/Hands: None  Permission Sought/Granted                  Emotional Assessment Appearance:: Appears stated age Attitude/Demeanor/Rapport: Engaged Affect (typically observed): Appropriate Orientation: : Oriented to Self, Oriented to Place, Oriented to  Time, Oriented to Situation Alcohol / Substance Use: Not Applicable Psych Involvement: No (comment)  Admission diagnosis:  Dislocation of right knee, initial encounter [S83.104A] Knee dislocation, right, initial encounter [S83.104A] Patellar tendon rupture, right, initial encounter [id number] Patient Active Problem List   Diagnosis Date Noted   Knee dislocation, right, initial encounter 01/19/2021   Osteoarthritis of right knee 01/07/2021   PCP:  Buzzy Han, MD Pharmacy:   [facility] [address]  [place] - [id number] [place] [place] [address] Phone: [phone] Fax: [phone]     Social Determinants of Health (SDOH) Interventions     Readmission Risk Interventions No flowsheet data found.

## 2021-01-22 NOTE — Evaluation (Addendum)
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  Pt with hx of arthritis, HTN, and obesity  Clinical Impression  Pt s/p R TKR revision and patellar tendon repair and presents with functional mobility limitations 2* decreased R LE strength, no ROM R LE, TDWB R LE, obesity and post op pain.  Pt would benefit from follow up rehab at SNF level to maximize safety and independence prior to return home with limited assist.  This date, with increased time and encouragement, pt up to EOB sitting with assist of 2.  Standing deferred at pt request 2* pain and elevated anxiety level regarding further damage to knee.    Follow Up Recommendations Follow surgeon's recommendation for DC plan and follow-up therapies;SNF    Equipment Recommendations  None recommended by PT    Recommendations for Other Services       Precautions / Restrictions Precautions Precautions: Fall;Other (comment) Precaution Comments: No ROM at R knee Restrictions Weight Bearing Restrictions: Yes RLE Weight Bearing: Touchdown weight bearing      Mobility  Bed Mobility Overal bed mobility: Needs Assistance Bed Mobility: Supine to Sit;Sit to Supine     Supine to sit: Mod assist;+2 for physical assistance;+2 for safety/equipment Sit to supine: Mod assist;+2 for physical assistance;+2 for safety/equipment   General bed mobility comments: Increased time with cues for sequence.  Physical assist to manage R LE, to assist with trunk, and to complete rotation to/from EOB sitting    Transfers                 General transfer comment: NT - deferred at pt request 2* pain and elevated anxiety regarding further damage to knee  Ambulation/Gait                Stairs            Wheelchair Mobility    Modified  Rankin (Stroke Patients Only)       Balance Overall balance assessment: Needs assistance Sitting-balance support: No upper extremity supported Sitting balance-Leahy Scale: Good                                       Pertinent Vitals/Pain Pain Assessment: Faces Faces Pain Scale: Hurts even more Pain Location: R knee Pain Descriptors / Indicators: Burning;Sore;Spasm Pain Intervention(s): Limited activity within patient's tolerance;Monitored during session;Premedicated before session;Patient requesting pain meds-RN notified;Ice applied    Home Living Family/patient expects to be discharged to:: Skilled nursing facility Living Arrangements: Spouse/significant other Available Help at Discharge: Family;Available 24 hours/day Type of Home: House Home Access: Stairs to enter   [employer] of Steps: 2 Home Layout: One level;Other (Comment) Home Equipment: Cane - single point;Walker - 2 wheels      Prior Function Level of Independence: Independent with assistive device(s)         Comments: Pt using RW from initial surgery to readmit     Hand Dominance        Extremity/Trunk Assessment   Upper Extremity Assessment Upper Extremity Assessment: Overall WFL for tasks assessed    Lower Extremity Assessment Lower Extremity Assessment: LLE deficits/detail RLE Deficits / Details: L knee splinted in extension    Cervical / Trunk Assessment Cervical /  Trunk Assessment: Normal  Communication   Communication: No difficulties  Cognition Arousal/Alertness: Awake/alert Behavior During Therapy: WFL for tasks assessed/performed Overall Cognitive Status: Within Functional Limits for tasks assessed                                        General Comments      Exercises     Assessment/Plan    PT Assessment Patient needs continued PT services  PT Problem List Decreased strength;Decreased mobility;Decreased safety awareness;Decreased  range of motion;Decreased activity tolerance;Decreased balance;Decreased knowledge of use of DME;Pain;Obesity       PT Treatment Interventions DME instruction;Therapeutic activities;Modalities;Gait training;Therapeutic exercise;Patient/family education;Stair training;Functional mobility training;Balance training    PT Goals (Current goals can be found in the Care Plan section)  Acute Rehab PT Goals Patient Stated Goal: Regain use of knee PT Goal Formulation: With patient Time For Goal Achievement: 01/21/21 Potential to Achieve Goals: Good    Frequency Min 6X/week   Barriers to discharge Decreased caregiver support spouse is limited in ability to physically assist    Co-evaluation               AM-PAC PT "6 Clicks" Mobility  Outcome Measure Help needed turning from your back to your side while in a flat bed without using bedrails?: A Lot Help needed moving from lying on your back to sitting on the side of a flat bed without using bedrails?: A Lot Help needed moving to and from a bed to a chair (including a wheelchair)?: A Lot Help needed standing up from a chair using your arms (e.g., wheelchair or bedside chair)?: A Lot Help needed to walk in hospital room?: A Lot Help needed climbing 3-5 steps with a railing? : Total 6 Click Score: 11    End of Session   Activity Tolerance: Patient tolerated treatment well Patient left: in bed;with call bell/phone within reach;with nursing/sitter in room Nurse Communication: Mobility status PT Visit Diagnosis: Other abnormalities of gait and mobility (R26.89);Muscle weakness (generalized) (M62.81);Pain;Difficulty in walking, not elsewhere classified (R26.2) Pain - Right/Left: Right Pain - part of body: Knee    Time: 1310-1346 PT Time Calculation (min) (ACUTE ONLY): 36 min   Charges:   PT Evaluation $PT Eval Low Complexity: 1 Low PT Treatments $Therapeutic Activity: 8-22 mins        Debe Coder PT Acute Rehabilitation  Services Pager [phone] Office [phone]   Horris Speros 01/22/2021, 3:17 PM

## 2021-01-22 NOTE — NC FL2 (Signed)
[place] LEVEL OF CARE SCREENING TOOL     IDENTIFICATION  Patient Name: Harry [name] Birthdate: Mar 30, 1951 Sex: male Admission Date (Current Location): 01/19/2021  [facility] and [place] Number:  Herbalist and Address:  [facility],  [place] [address] [place]      Provider Number: [id number]  Attending Physician Name and Address:  Rod Can, MD  Relative Name and Phone Number:  Gladis Riffle (Other)   [phone]    Current Level of Care: Hospital Recommended Level of Care: [place] Prior Approval Number:    Date Approved/Denied:   PASRR Number: [id number]  Discharge Plan: SNF    Current Diagnoses: Patient Active Problem List   Diagnosis Date Noted   Knee dislocation, right, initial encounter 01/19/2021   Osteoarthritis of right knee 01/07/2021    Orientation RESPIRATION BLADDER Height & Weight     Self, Time, Situation, Place  Normal External catheter Weight: (!) 142.4 kg Height:  '6\' 3"'$  (190.5 cm)  BEHAVIORAL SYMPTOMS/MOOD NEUROLOGICAL BOWEL NUTRITION STATUS   (none)  (none) Incontinent Diet (see d/c summary)  AMBULATORY STATUS COMMUNICATION OF NEEDS Skin   Extensive Assist Verbally Surgical wounds (R knee wound and drain)                       Personal Care Assistance Level of Assistance  Bathing, Feeding, Dressing Bathing Assistance: Maximum assistance Feeding assistance: Independent Dressing Assistance: Limited assistance     Functional Limitations Info  Sight, Hearing, Speech Sight Info: Adequate Hearing Info: Adequate Speech Info: Adequate    SPECIAL CARE FACTORS FREQUENCY  PT (By licensed PT), OT (By licensed OT)     PT Frequency: 5X/W OT Frequency: 5X/W            Contractures Contractures Info: Not present    Additional Factors Info  Code Status, Allergies Code Status Info: full Allergies Info: NKA           Current Medications (01/22/2021):  This is  the current hospital active medication list Current Facility-Administered Medications  Medication Dose Route Frequency Provider Last Rate Last Admin   0.9 %  sodium chloride infusion   Intravenous Continuous Swinteck, Aaron Edelman, MD 150 mL/hr at 01/22/21 1500 Infusion Verify at 01/22/21 1500   alum & mag hydroxide-simeth (MAALOX/MYLANTA) 200-200-20 MG/5ML suspension 30 mL  30 mL Oral Q4H PRN Swinteck, Aaron Edelman, MD       atorvastatin (LIPITOR) tablet 20 mg  20 mg Oral Daily Swinteck, Aaron Edelman, MD   20 mg at 01/22/21 1021   carboxymethylcellul-glycerin (REFRESH OPTIVE) 0.5-0.9 % ophthalmic solution 1 drop  1 drop Both Eyes TID PRN Rod Can, MD       Chlorhexidine Gluconate Cloth 2 % PADS 6 each  6 each Topical Daily Swinteck, Aaron Edelman, MD       diphenhydrAMINE (BENADRYL) 12.5 MG/5ML elixir 12.5-25 mg  12.5-25 mg Oral Q4H PRN Swinteck, Aaron Edelman, MD       docusate sodium (COLACE) capsule 100 mg  100 mg Oral BID Rod Can, MD   100 mg at 01/22/21 1022   enoxaparin (LOVENOX) injection 30 mg  30 mg Subcutaneous Q12H Rod Can, MD   30 mg at 01/22/21 0811   HYDROcodone-acetaminophen (NORCO) 7.5-325 MG per tablet 1-2 tablet  1-2 tablet Oral Q4H PRN Rod Can, MD   1 tablet at 01/22/21 1556   HYDROcodone-acetaminophen (NORCO/VICODIN) 5-325 MG per tablet 1-2 tablet  1-2 tablet Oral Q4H PRN Rod Can, MD  1 tablet at 01/22/21 0208   HYDROmorphone (DILAUDID) injection 1 mg  1 mg Intravenous Once Swinteck, Aaron Edelman, MD       menthol-cetylpyridinium (CEPACOL) lozenge 3 mg  1 lozenge Oral PRN Swinteck, Aaron Edelman, MD       Or   phenol (CHLORASEPTIC) mouth spray 1 spray  1 spray Mouth/Throat PRN Swinteck, Aaron Edelman, MD       methocarbamol (ROBAXIN) tablet 500 mg  500 mg Oral Q6H PRN Swinteck, Aaron Edelman, MD   500 mg at 01/22/21 1556   metoCLOPramide (REGLAN) tablet 5-10 mg  5-10 mg Oral Q8H PRN Swinteck, Aaron Edelman, MD       Or   metoCLOPramide (REGLAN) injection 5-10 mg  5-10 mg Intravenous Q8H PRN Swinteck, Aaron Edelman, MD        multivitamin (PROSIGHT) tablet 1 tablet  1 tablet Oral BID Rod Can, MD   1 tablet at 01/22/21 1020   multivitamin with minerals tablet 1 tablet  1 tablet Oral Daily Swinteck, Aaron Edelman, MD   1 tablet at 01/22/21 1019   ondansetron (ZOFRAN) tablet 4 mg  4 mg Oral Q6H PRN Swinteck, Aaron Edelman, MD       Or   ondansetron [facility]) injection 4 mg  4 mg Intravenous Q6H PRN Rod Can, MD   4 mg at 01/21/21 2223   polyethylene glycol (MIRALAX / GLYCOLAX) packet 17 g  17 g Oral Daily PRN Rod Can, MD         Discharge Medications: Please see discharge summary for a list of discharge medications.  Relevant Imaging Results:  Relevant Lab Results:   Additional Information [place], [place]

## 2021-01-23 LAB — CBC
HCT: 22.6 % — ABNORMAL LOW (ref 39.0–52.0)
Hemoglobin: 7.2 g/dL — ABNORMAL LOW (ref 13.0–17.0)
MCH: 30 pg (ref 26.0–34.0)
MCHC: 31.9 g/dL (ref 30.0–36.0)
MCV: 94.2 fL (ref 80.0–100.0)
Platelets: 168 10*3/uL (ref 150–400)
RBC: 2.4 MIL/uL — ABNORMAL LOW (ref 4.22–5.81)
RDW: 16.2 % — ABNORMAL HIGH (ref 11.5–15.5)
WBC: 14.5 10*3/uL — ABNORMAL HIGH (ref 4.0–10.5)
nRBC: 0.1 % (ref 0.0–0.2)

## 2021-01-23 LAB — PREPARE RBC (CROSSMATCH)

## 2021-01-23 MED ORDER — HYDROCODONE-ACETAMINOPHEN 7.5-325 MG PO TABS: 1.0000 | ORAL_TABLET | ORAL | 0 refills | Status: DC | PRN

## 2021-01-23 MED ORDER — SODIUM CHLORIDE 0.9% IV SOLUTION: Freq: Once | INTRAVENOUS | Status: AC

## 2021-01-23 NOTE — Progress Notes (Signed)
Started 1unit PRBC transfusion, no reactions noted, vital signs recorded. RN will continue to monitor.

## 2021-01-23 NOTE — Anesthesia Postprocedure Evaluation (Signed)
Anesthesia Post Note  Patient: [name]  Procedure(s) Performed: TOTAL KNEE REVISION BOTH COMPONENTS (Right: Knee) PATELLA TENDON REPAIR (Right)     Patient location during evaluation: PACU Anesthesia Type: Regional and General Level of consciousness: awake and patient uncooperative Pain management: pain level controlled Vital Signs Assessment: post-procedure vital signs reviewed and stable Respiratory status: spontaneous breathing, nonlabored ventilation, respiratory function stable and patient connected to nasal cannula oxygen Cardiovascular status: blood pressure returned to baseline and stable Postop Assessment: no apparent nausea or vomiting Anesthetic complications: no Comments: Patient initially delirious upon arrival to PACU. After sedatives and pain control patient became more cooperative and rested comfortably. Held in PACU due to no step down beds available   No notable events documented.  Last Vitals:  Vitals:   01/23/21 1434 01/23/21 1454  BP: 115/68 123/71  Pulse: 87 91  Resp: 16 18  Temp: 36.8 C 36.8 C  SpO2: 97% 96%    Last Pain:  Vitals:   01/23/21 1454  TempSrc: Oral  PainSc:                  Harry [name]

## 2021-01-23 NOTE — Progress Notes (Signed)
    Subjective:  Patient reports pain as mild to moderate.  Denies N/V/CP/SOB.   Objective:   VITALS:   Vitals:   01/22/21 2233 01/23/21 0147 01/23/21 0623 01/23/21 0858  BP: 111/60 130/64 127/73 (!) 108/46  Pulse: 64 80 78 88  Resp: '18 18 18 17  '$ Temp: 98.9 F (37.2 C) 97.9 F (36.6 C) 97.8 F (36.6 C) 98.1 F (36.7 C)  TempSrc: Oral Oral Oral Oral  SpO2: 99% 96% 99% 99%  Weight:      Height:       JP 180 cc during day shift.  No night shift recorded number  NAD Sensation intact distally Intact pulses distally Incision: dressing C/D/I Wiggles toes Incisional VAC intact JP ss   Lab Results  Component Value Date   WBC 14.5 (H) 01/23/2021   HGB 7.2 (L) 01/23/2021   HCT 22.6 (L) 01/23/2021   MCV 94.2 01/23/2021   PLT 168 01/23/2021   BMET    Component Value Date/Time   NA 136 01/22/2021 0300   K 5.0 01/22/2021 0300   CL 103 01/22/2021 0300   CO2 24 01/22/2021 0300   GLUCOSE 127 (H) 01/22/2021 0300   BUN 28 (H) 01/22/2021 0300   CREATININE 1.37 (H) 01/22/2021 0300   CREATININE 1.39 (H) 01/22/2021 0300   CALCIUM 7.8 (L) 01/22/2021 0300   GFRNONAA 56 (L) 01/22/2021 0300   GFRNONAA 55 (L) 01/22/2021 0300   GFRAA 88 (L) 05/15/2012 1005     Assessment/Plan: 2 Days Post-Op   Active Problems:   Knee dislocation, right, initial encounter   25% WB RLE with walker Ice, elevation DO NOT BEND KNEE DVT ppx: Lovenox, SCDs, TEDS     Hold Lovenox this AM due to low HgB ABLA: HgB 7.2.  Patient feels tired. Will order 1 unit PRBC. Continue to monitor PO pain control PT/OT Dispo:SNF placement, will convert house VAC to portable Prevena upon [place] 01/23/2021, 9:51 AM  [place] is now [facility] [address] [place], [place], [place] [address] Phone: [phone] [email or web address] Facebook  [employer]

## 2021-01-23 NOTE — Care Management Important Message (Signed)
Important Message  Patient Details IM Letter given to the Patient. Name: Harry [name] MRN: [id number] Date of Birth: [birth year]   Medicare Important Message Given:  Yes     [name] 01/23/2021, 11:06 AM

## 2021-01-23 NOTE — Plan of Care (Signed)
  Problem: Nutrition: Goal: Adequate nutrition will be maintained Outcome: Progressing   Problem: Coping: Goal: Level of anxiety will decrease Outcome: Progressing   Problem: Pain Managment: Goal: General experience of comfort will improve Outcome: Progressing   Problem: Safety: Goal: Ability to remain free from injury will improve Outcome: Progressing   

## 2021-01-23 NOTE — Plan of Care (Signed)

## 2021-01-23 NOTE — Progress Notes (Signed)
Delivered portable Prevena vacuum unit to patient's room. House vac should be changed to [facility] unit upon patient's discharge to rehab  Dorothyann Peng 1:31 PM 01/23/21

## 2021-01-23 NOTE — Progress Notes (Addendum)
Physical Therapy Treatment Patient Details Name: Harry [name] MRN: [id number] DOB: 10-09-1950 Today's Date: 01/23/2021    History of Present Illness Pt is 70 yo male s/p R TKA on 12/2020 and now s/p R TKR revision with patellar tendon repair.  Pt with hx of arthritis, HTN, and obesity    PT Comments    Pt very cooperative this pm but with elevated level of anxiety.  Pt assisted to EOB sitting and assisted to stand by elevating bed and two person assist to bring wt up and fwd to balance semi-erect with RW while nursing changed linens on bed and repositioned bed behind pt for better positioning on bed.  Pt with good awareness and follow through of TDWB on R LE throughout   Follow Up Recommendations  Follow surgeon's recommendation for DC plan and follow-up therapies;SNF     Equipment Recommendations  None recommended by PT    Recommendations for Other Services       Precautions / Restrictions Precautions Precautions: Fall;Other (comment) Precaution Comments: No ROM at R knee Restrictions Weight Bearing Restrictions: Yes RLE Weight Bearing: Touchdown weight bearing    Mobility  Bed Mobility Overal bed mobility: Needs Assistance Bed Mobility: Supine to Sit;Sit to Supine     Supine to sit: Mod assist;+2 for physical assistance;+2 for safety/equipment Sit to supine: Mod assist;+2 for physical assistance;+2 for safety/equipment   General bed mobility comments: Increased time with cues for sequence.  Physical assist to manage R LE, to assist with trunk, and to complete rotation to/from EOB sitting    Transfers Overall transfer level: Needs assistance Equipment used: Rolling walker (2 wheeled) Transfers: Sit to/from Stand Sit to Stand: Mod assist;+2 physical assistance;From elevated surface         General transfer comment: Bed used to elevate pt.  CUes for LE management and use of UEs to self assist.  PHysical assist to bring wt up and fwd and to balance in standing with  RW with min weight on R LE  Ambulation/Gait                 Stairs             Wheelchair Mobility    Modified Rankin (Stroke Patients Only)       Balance Overall balance assessment: Needs assistance Sitting-balance support: No upper extremity supported Sitting balance-Leahy Scale: Good     Standing balance support: Bilateral upper extremity supported Standing balance-Leahy Scale: Poor Standing balance comment: Heavy reliance on RW                            Cognition Arousal/Alertness: Awake/alert Behavior During Therapy: [facility] for tasks assessed/performed;Anxious Overall Cognitive Status: Within Functional Limits for tasks assessed                                        Exercises      General Comments        Pertinent Vitals/Pain Pain Assessment: 0-10 Pain Score: 4  Pain Location: R knee Pain Descriptors / Indicators: Burning;Sore;Spasm Pain Intervention(s): Limited activity within patient's tolerance;Monitored during session;Premedicated before session;Ice applied    Home Living                      Prior Function            PT  Goals (current goals can now be found in the care plan section) Acute Rehab PT Goals Patient Stated Goal: Regain use of knee PT Goal Formulation: With patient Time For Goal Achievement: 02/05/21 Potential to Achieve Goals: Good Progress towards PT goals: Progressing toward goals    Frequency    Min 6X/week      PT Plan Current plan remains appropriate    Co-evaluation              AM-PAC PT "6 Clicks" Mobility   Outcome Measure  Help needed turning from your back to your side while in a flat bed without using bedrails?: A Lot Help needed moving from lying on your back to sitting on the side of a flat bed without using bedrails?: A Lot Help needed moving to and from a bed to a chair (including a wheelchair)?: A Lot Help needed standing up from a chair using your  arms (e.g., wheelchair or bedside chair)?: A Lot Help needed to walk in hospital room?: Total Help needed climbing 3-5 steps with a railing? : Total 6 Click Score: 10    End of Session Equipment Utilized During Treatment: Gait belt Activity Tolerance: Patient tolerated treatment well;Patient limited by fatigue;Patient limited by pain Patient left: in bed;with call bell/phone within reach;with nursing/sitter in room Nurse Communication: Mobility status PT Visit Diagnosis: Other abnormalities of gait and mobility (R26.89);Muscle weakness (generalized) (M62.81);Pain;Difficulty in walking, not elsewhere classified (R26.2) Pain - Right/Left: Right Pain - part of body: Knee     Time: [id number] PT Time Calculation (min) (ACUTE ONLY): 26 min  Charges:  $Therapeutic Activity: 23-37 mins                     [place] Pager [phone] Office [phone]    Irie Dowson 01/23/2021, 4:13 PM

## 2021-01-24 LAB — TYPE AND SCREEN
ABO/RH(D): O NEG
Antibody Screen: NEGATIVE
Unit division: 0
Unit division: 0
Unit division: 0

## 2021-01-24 LAB — BPAM RBC
Blood Product Expiration Date: 202208192359
Blood Product Expiration Date: 202208192359
Blood Product Expiration Date: 202208312359
ISSUE DATE / TIME: 202208031359
ISSUE DATE / TIME: 202208031359
ISSUE DATE / TIME: 202208051156
Unit Type and Rh: 9500
Unit Type and Rh: 9500
Unit Type and Rh: 9500

## 2021-01-24 LAB — CBC
HCT: 25.2 % — ABNORMAL LOW (ref 39.0–52.0)
Hemoglobin: 7.8 g/dL — ABNORMAL LOW (ref 13.0–17.0)
MCH: 28.5 pg (ref 26.0–34.0)
MCHC: 31 g/dL (ref 30.0–36.0)
MCV: 92 fL (ref 80.0–100.0)
Platelets: 155 10*3/uL (ref 150–400)
RBC: 2.74 MIL/uL — ABNORMAL LOW (ref 4.22–5.81)
RDW: 18 % — ABNORMAL HIGH (ref 11.5–15.5)
WBC: 9.7 10*3/uL (ref 4.0–10.5)
nRBC: 0.5 % — ABNORMAL HIGH (ref 0.0–0.2)

## 2021-01-24 LAB — BASIC METABOLIC PANEL
Anion gap: 6 (ref 5–15)
BUN: 23 mg/dL (ref 8–23)
CO2: 24 mmol/L (ref 22–32)
Calcium: 8 mg/dL — ABNORMAL LOW (ref 8.9–10.3)
Chloride: 108 mmol/L (ref 98–111)
Creatinine, Ser: 0.95 mg/dL (ref 0.61–1.24)
GFR, Estimated: >60 mL/min (ref >60)
Glucose, Bld: 101 mg/dL — ABNORMAL HIGH (ref 70–99)
Potassium: 3.9 mmol/L (ref 3.5–5.1)
Sodium: 138 mmol/L (ref 135–145)

## 2021-01-24 MED ORDER — LIP MEDEX EX OINT
TOPICAL_OINTMENT | CUTANEOUS | Status: DC | PRN
  Administered 2021-01-24: 1 via TOPICAL
  Filled 2021-01-24: qty 7

## 2021-01-24 NOTE — Progress Notes (Signed)
Subjective: 3 Days Post-Op Procedure(s) (LRB): TOTAL KNEE REVISION BOTH COMPONENTS (Right) PATELLA TENDON REPAIR (Right) Patient reports pain as 6 on 0-10 scale.  Complaining of spasms. +void into condom cath +flatus Toleratibng PO without N/V  Objective: Vital signs in last 24 hours: Temp:  [97.9 F (36.6 C)-98.3 F (36.8 C)] 98 F (36.7 C) (08/06 0527) Pulse Rate:  [80-95] 80 (08/06 0527) Resp:  [15-19] 15 (08/06 0527) BP: (108-127)/(58-75) 115/69 (08/06 0527) SpO2:  [95 %-99 %] 95 % (08/06 0527)  Intake/Output from previous day: 08/05 0701 - 08/06 0700 In: 1558.5 [P.O.:480; I.V.:451.5; Blood:627] Out: 1885 [Urine:1850; Drains:35] Intake/Output this shift: Total I/O In: 200 [P.O.:200] Out: 150 [Urine:150]  Recent Labs    01/21/21 1517 01/21/21 1904 01/22/21 0300 01/23/21 0322 01/24/21 0334  HGB 8.8* 9.5* 9.0* 7.2* 7.8*   Recent Labs    01/23/21 0322 01/24/21 0334  WBC 14.5* 9.7  RBC 2.40* 2.74*  HCT 22.6* 25.2*  PLT 168 155   Recent Labs    01/22/21 0300 01/24/21 0817  NA 136 138  K 5.0 3.9  CL 103 108  CO2 24 24  BUN 28* 23  CREATININE 1.39*  1.37* 0.95  GLUCOSE 127* 101*  CALCIUM 7.8* 8.0*   No results for input(s): LABPT, INR in the last 72 hours.  Neurologically intact Wiggles toes Good cap refill    Assessment/Plan: 3 Days Post-Op Procedure(s) (LRB): TOTAL KNEE REVISION BOTH COMPONENTS (Right) PATELLA TENDON REPAIR (Right) 25% WB RLE with walker Ice, elevation DO NOT BEND KNEE (remian in splint) DVT ppx: Lovenox, SCDs, TEDS      ABLA: HgB 7.2-->7.8 (improved this am) PO pain control PT/OT Dispo:SNF placement, will convert house VAC to portable [place] upon [place] 01/24/2021, 9:04 AM

## 2021-01-24 NOTE — Progress Notes (Signed)
Physical Therapy Treatment Patient Details Name: Harry [name] MRN: [id number] DOB: April 24, 1951 Today's Date: 01/24/2021    History of Present Illness Pt is 70 yo male s/p R TKA on 12/2020 and now s/p R TKR revision with patellar tendon repair.  Pt with hx of arthritis, HTN, and obesity    PT Comments    Pt continues very cooperative but with elevated level of anxiety.  Pt assisted to EOB sitting and assisted to stand by elevating bed and two person assist to bring wt up and fwd to balance with RW while nursing changed linens on bed and repositioned bed behind pt for better positioning on bed.  Pt with good awareness and follow through of TDWB on R LE throughout.  Pt also noted to achieve erect standing and with increased ease of movement vs last session.  Follow Up Recommendations  Follow surgeon's recommendation for DC plan and follow-up therapies;SNF     Equipment Recommendations  None recommended by PT    Recommendations for Other Services       Precautions / Restrictions Precautions Precautions: Fall;Other (comment) Precaution Comments: No ROM at R knee Restrictions Weight Bearing Restrictions: Yes RLE Weight Bearing: Touchdown weight bearing    Mobility  Bed Mobility Overal bed mobility: Needs Assistance Bed Mobility: Supine to Sit;Sit to Supine     Supine to sit: Min assist;+2 for physical assistance;+2 for safety/equipment Sit to supine: Mod assist;+2 for physical assistance;+2 for safety/equipment   General bed mobility comments: Increased time with cues for sequence, use of L LE to self assist and use of bedrail. Physical assist to manage R LE and to assist with trunk    Transfers Overall transfer level: Needs assistance Equipment used: Rolling walker (2 wheeled) Transfers: Sit to/from Stand Sit to Stand: Mod assist;+2 physical assistance;From elevated surface         General transfer comment: Bed used to elevate pt.  CUes for LE management and use of UEs  to self assist.  PHysical assist to bring wt up and fwd and to balance in standing with RW and with min weight on R LE  Ambulation/Gait                 Stairs             Wheelchair Mobility    Modified Rankin (Stroke Patients Only)       Balance Overall balance assessment: Needs assistance Sitting-balance support: No upper extremity supported Sitting balance-Leahy Scale: Good     Standing balance support: Bilateral upper extremity supported Standing balance-Leahy Scale: Poor Standing balance comment: Heavy reliance on RW                            Cognition Arousal/Alertness: Awake/alert Behavior During Therapy: [facility] for tasks assessed/performed;Anxious Overall Cognitive Status: Within Functional Limits for tasks assessed                                        Exercises      General Comments        Pertinent Vitals/Pain Pain Assessment: 0-10 Pain Score: 4  Pain Location: R knee Pain Descriptors / Indicators: Burning;Sore;Spasm Pain Intervention(s): Limited activity within patient's tolerance    Home Living  Prior Function            PT Goals (current goals can now be found in the care plan section) Acute Rehab PT Goals Patient Stated Goal: Regain use of knee PT Goal Formulation: With patient Time For Goal Achievement: 02/05/21 Potential to Achieve Goals: Good Progress towards PT goals: Progressing toward goals    Frequency    Min 6X/week      PT Plan Current plan remains appropriate    Co-evaluation              AM-PAC PT "6 Clicks" Mobility   Outcome Measure  Help needed turning from your back to your side while in a flat bed without using bedrails?: A Lot Help needed moving from lying on your back to sitting on the side of a flat bed without using bedrails?: A Lot Help needed moving to and from a bed to a chair (including a wheelchair)?: A Lot Help needed standing  up from a chair using your arms (e.g., wheelchair or bedside chair)?: A Lot Help needed to walk in hospital room?: Total Help needed climbing 3-5 steps with a railing? : Total 6 Click Score: 10    End of Session Equipment Utilized During Treatment: Gait belt Activity Tolerance: Patient tolerated treatment well;Patient limited by fatigue;Patient limited by pain Patient left: in bed;with call bell/phone within reach;with nursing/sitter in room Nurse Communication: Mobility status PT Visit Diagnosis: Other abnormalities of gait and mobility (R26.89);Muscle weakness (generalized) (M62.81);Pain;Difficulty in walking, not elsewhere classified (R26.2) Pain - Right/Left: Right Pain - part of body: Knee     Time: [id number] PT Time Calculation (min) (ACUTE ONLY): 23 min  Charges:  $Therapeutic Activity: 23-37 mins                     Debe Coder PT Acute Rehabilitation Services Pager [phone] Office [phone]    Larisha Vencill 01/24/2021, 1:07 PM

## 2021-01-24 NOTE — Plan of Care (Signed)

## 2021-01-24 NOTE — Plan of Care (Signed)
  Problem: Education: Goal: Knowledge of General Education information will improve Description Including pain rating scale, medication(s)/side effects and non-pharmacologic comfort measures Outcome: Progressing   

## 2021-01-24 NOTE — TOC Progression Note (Addendum)
Transition of Care [facility]) - Progression Note    Patient Details  Name: Harry [name] MRN: [id number] Date of Birth: 02/15/1951  [facility] CM/SW Contact  Ross Ludwig, [place] Phone Number: 01/24/2021, 1:19 PM  Clinical Narrative:     CSW sent updated therapy notes to other snfs.  CSW awaiting for new bed offers.  Patient will then have to wait for insurance authorization to be approved.  2:20pm CSW spoke to patient's wife to discuss SNF placement options.  Per patient's wife, she said she spoke to Dorene Sorrow, who said to contact [facility] to try to get a bed for patient.  CSW attempted to contact [facility], had to leave a message on voice mail.  Patient will have to have insurance authorization before he is able to go to SNF.   Expected Discharge Plan: Skilled Nursing Facility Barriers to Discharge: SNF Pending bed offer  Expected Discharge Plan and Services Expected Discharge Plan: [place]   Discharge Planning Services: CM Consult Post Acute Care Choice: [place] Living arrangements for the past 2 months: Single Family Home                                       Social Determinants of Health (SDOH) Interventions    Readmission Risk Interventions No flowsheet data found.

## 2021-01-25 LAB — BASIC METABOLIC PANEL
Anion gap: 4 — ABNORMAL LOW (ref 5–15)
BUN: 19 mg/dL (ref 8–23)
CO2: 25 mmol/L (ref 22–32)
Calcium: 7.8 mg/dL — ABNORMAL LOW (ref 8.9–10.3)
Chloride: 108 mmol/L (ref 98–111)
Creatinine, Ser: 0.8 mg/dL (ref 0.61–1.24)
GFR, Estimated: >60 mL/min (ref >60)
Glucose, Bld: 95 mg/dL (ref 70–99)
Potassium: 3.8 mmol/L (ref 3.5–5.1)
Sodium: 137 mmol/L (ref 135–145)

## 2021-01-25 NOTE — Progress Notes (Signed)
Physical Therapy Treatment Patient Details Name: Harry [name] MRN: [id number] DOB: Mar 01, 1951 Today's Date: 01/25/2021    History of Present Illness Pt is 70 yo male s/p R TKA on 12/2020 and now s/p R TKR revision with patellar tendon repair.  Pt with hx of arthritis, HTN, and obesity    PT Comments    Pt continues to progress slowly but steadily with mobility and with noted improvement in activity tolerance and confidence in movement.  This date, pt with decreased pain, decreased assist for bed mobility and willing to attempt standing second time.  Pt hopeful for dc to SNF rehab tomorrow.  Follow Up Recommendations  Follow surgeon's recommendation for DC plan and follow-up therapies;SNF     Equipment Recommendations  None recommended by PT    Recommendations for Other Services       Precautions / Restrictions Precautions Precautions: Fall;Other (comment) Precaution Comments: No ROM at R knee Restrictions Weight Bearing Restrictions: Yes RLE Weight Bearing: Touchdown weight bearing    Mobility  Bed Mobility Overal bed mobility: Needs Assistance Bed Mobility: Supine to Sit;Sit to Supine     Supine to sit: Min assist;+2 for physical assistance;+2 for safety/equipment Sit to supine: +2 for physical assistance;+2 for safety/equipment;Min assist   General bed mobility comments: Increased time with cues for sequence, use of L LE to self assist and use of bedrail. Physical assist to manage R LE and to assist with trunk    Transfers Overall transfer level: Needs assistance Equipment used: Rolling walker (2 wheeled) Transfers: Sit to/from Stand Sit to Stand: Mod assist;+2 physical assistance;From elevated surface         General transfer comment: Bed used to elevate pt.  CUes for LE management and use of UEs to self assist.  PHysical assist to bring wt up and fwd and to balance in standing with RW and with min weight on R LE.  Pt stood x 2  Ambulation/Gait                  Stairs             Wheelchair Mobility    Modified Rankin (Stroke Patients Only)       Balance Overall balance assessment: Needs assistance Sitting-balance support: No upper extremity supported Sitting balance-Leahy Scale: Good     Standing balance support: Bilateral upper extremity supported Standing balance-Leahy Scale: Poor Standing balance comment: Heavy reliance on RW                            Cognition Arousal/Alertness: Awake/alert Behavior During Therapy: WFL for tasks assessed/performed;Anxious Overall Cognitive Status: Within Functional Limits for tasks assessed                                        Exercises      General Comments        Pertinent Vitals/Pain Pain Assessment: 0-10 Pain Score: 4  Pain Location: R knee Pain Descriptors / Indicators: Burning;Sore;Spasm Pain Intervention(s): Limited activity within patient's tolerance;Monitored during session;Premedicated before session    Home Living                      Prior Function            PT Goals (current goals can now be found in the care plan section) Acute  Rehab PT Goals Patient Stated Goal: Regain use of knee PT Goal Formulation: With patient Time For Goal Achievement: 02/05/21 Potential to Achieve Goals: Good Progress towards PT goals: Progressing toward goals    Frequency    Min 6X/week      PT Plan Current plan remains appropriate    Co-evaluation              AM-PAC PT "6 Clicks" Mobility   Outcome Measure  Help needed turning from your back to your side while in a flat bed without using bedrails?: A Lot Help needed moving from lying on your back to sitting on the side of a flat bed without using bedrails?: A Lot Help needed moving to and from a bed to a chair (including a wheelchair)?: A Lot Help needed standing up from a chair using your arms (e.g., wheelchair or bedside chair)?: A Lot Help needed to walk  in hospital room?: Total Help needed climbing 3-5 steps with a railing? : Total 6 Click Score: 10    End of Session Equipment Utilized During Treatment: Gait belt Activity Tolerance: Patient tolerated treatment well;Patient limited by fatigue;Patient limited by pain Patient left: in bed;with call bell/phone within reach;with nursing/sitter in room Nurse Communication: Mobility status PT Visit Diagnosis: Other abnormalities of gait and mobility (R26.89);Muscle weakness (generalized) (M62.81);Pain;Difficulty in walking, not elsewhere classified (R26.2) Pain - Right/Left: Right Pain - part of body: Knee     Time: 1035-1100 PT Time Calculation (min) (ACUTE ONLY): 25 min  Charges:  $Therapeutic Activity: 23-37 mins                     Debe Coder PT Acute Rehabilitation Services Pager [phone] Office [phone]    Madilyn Cephas 01/25/2021, 2:59 PM

## 2021-01-25 NOTE — Progress Notes (Signed)
Pt wife would like a call or contact from social work about pt placement status. This was attempted today but social worker could not reach out in time today.

## 2021-01-25 NOTE — Plan of Care (Signed)
  Problem: Education: Goal: Knowledge of General Education information will improve Description Including pain rating scale, medication(s)/side effects and non-pharmacologic comfort measures Outcome: Progressing   Problem: Health Behavior/Discharge Planning: Goal: Ability to manage health-related needs will improve Outcome: Progressing   

## 2021-01-25 NOTE — Plan of Care (Signed)

## 2021-01-25 NOTE — Progress Notes (Signed)
Subjective: 4 Days Post-Op Procedure(s) (LRB): TOTAL KNEE REVISION BOTH COMPONENTS (Right) PATELLA TENDON REPAIR (Right) Patient reports pain as mild.    Objective: Vital signs in last 24 hours: Temp:  [98 F (36.7 C)-98.6 F (37 C)] 98.3 F (36.8 C) (08/07 0544) Pulse Rate:  [73-92] 73 (08/07 0544) Resp:  [14-17] 14 (08/07 0544) BP: (109-116)/(71-73) 115/71 (08/07 0544) SpO2:  [96 %-99 %] 98 % (08/07 0544)  Intake/Output from previous day: 08/06 0701 - 08/07 0700 In: 1780 [P.O.:1780] Out: 2600 [Urine:2500; Drains:100] Intake/Output this shift: No intake/output data recorded.  Recent Labs    01/23/21 0322 01/24/21 0334  HGB 7.2* 7.8*   Recent Labs    01/23/21 0322 01/24/21 0334  WBC 14.5* 9.7  RBC 2.40* 2.74*  HCT 22.6* 25.2*  PLT 168 155   Recent Labs    01/24/21 0817 01/25/21 0227  NA 138 137  K 3.9 3.8  CL 108 108  CO2 24 25  BUN 23 19  CREATININE 0.95 0.80  GLUCOSE 101* 95  CALCIUM 8.0* 7.8*   No results for input(s): LABPT, INR in the last 72 hours.  Dorsiflexion/Plantar flexion intact Splint intact with VAC functioning well   Assessment/Plan: 4 Days Post-Op Procedure(s) (LRB): TOTAL KNEE REVISION BOTH COMPONENTS (Right) PATELLA TENDON REPAIR (Right)  Await SNF   Gaynelle Arabian 01/25/2021, 9:31 AM

## 2021-01-26 LAB — BASIC METABOLIC PANEL
Anion gap: 8 (ref 5–15)
BUN: 17 mg/dL (ref 8–23)
CO2: 24 mmol/L (ref 22–32)
Calcium: 8 mg/dL — ABNORMAL LOW (ref 8.9–10.3)
Chloride: 104 mmol/L (ref 98–111)
Creatinine, Ser: 0.93 mg/dL (ref 0.61–1.24)
GFR, Estimated: >60 mL/min (ref >60)
Glucose, Bld: 99 mg/dL (ref 70–99)
Potassium: 3.7 mmol/L (ref 3.5–5.1)
Sodium: 136 mmol/L (ref 135–145)

## 2021-01-26 NOTE — NC FL2 (Signed)
[place] LEVEL OF CARE SCREENING TOOL     IDENTIFICATION  Patient Name: Harry [name] Birthdate: 03/05/1951 Sex: male Admission Date (Current Location): 01/19/2021  [facility] and [place] Number:  Herbalist and Address:  [facility],  [place] [address] [place]      Provider Number: [id number]  Attending Physician Name and Address:  Rod Can, MD  Relative Name and Phone Number:  Gladis Riffle (Other)   [phone]    Current Level of Care: Hospital Recommended Level of Care: [place] Prior Approval Number:    Date Approved/Denied:   PASRR Number: [id number]  Discharge Plan: SNF    Current Diagnoses: Patient Active Problem List   Diagnosis Date Noted   Knee dislocation, right, initial encounter 01/19/2021   Osteoarthritis of right knee 01/07/2021    Orientation RESPIRATION BLADDER Height & Weight     Self, Time, Situation, Place  Normal External catheter Weight: 249 lb 12.8 oz (113.3 kg) Height:  '6\' 4"'$  (193 cm)  BEHAVIORAL SYMPTOMS/MOOD NEUROLOGICAL BOWEL NUTRITION STATUS   (none)  (none) Incontinent Diet (see d/c summary)  AMBULATORY STATUS COMMUNICATION OF NEEDS Skin   Extensive Assist Verbally Surgical wounds (R knee wound and drain)                       Personal Care Assistance Level of Assistance  Bathing, Feeding, Dressing Bathing Assistance: Maximum assistance Feeding assistance: Independent Dressing Assistance: Limited assistance     Functional Limitations Info  Sight, Hearing, Speech Sight Info: Adequate Hearing Info: Adequate Speech Info: Adequate    SPECIAL CARE FACTORS FREQUENCY  PT (By licensed PT), OT (By licensed OT)     PT Frequency: 5X/W OT Frequency: 5X/W            Contractures Contractures Info: Not present    Additional Factors Info  Code Status, Allergies Code Status Info: full Allergies Info: NKA           Current Medications (01/26/2021):   This is the current hospital active medication list Current Facility-Administered Medications  Medication Dose Route Frequency Provider Last Rate Last Admin   0.9 %  sodium chloride infusion   Intravenous Continuous Swinteck, Aaron Edelman, MD   Stopped at 01/23/21 1446   alum & mag hydroxide-simeth (MAALOX/MYLANTA) 200-200-20 MG/5ML suspension 30 mL  30 mL Oral Q4H PRN Swinteck, Aaron Edelman, MD       atorvastatin (LIPITOR) tablet 20 mg  20 mg Oral Daily Swinteck, Aaron Edelman, MD   20 mg at 01/26/21 0940   carboxymethylcellul-glycerin (REFRESH OPTIVE) 0.5-0.9 % ophthalmic solution 1 drop  1 drop Both Eyes TID PRN Rod Can, MD       diphenhydrAMINE (BENADRYL) 12.5 MG/5ML elixir 12.5-25 mg  12.5-25 mg Oral Q4H PRN Rod Can, MD   25 mg at 01/23/21 0027   docusate sodium (COLACE) capsule 100 mg  100 mg Oral BID Rod Can, MD   100 mg at 01/26/21 0941   enoxaparin (LOVENOX) injection 30 mg  30 mg Subcutaneous Q12H Swinteck, Aaron Edelman, MD   30 mg at 01/26/21 0941   HYDROcodone-acetaminophen (NORCO) 7.5-325 MG per tablet 1-2 tablet  1-2 tablet Oral Q4H PRN Rod Can, MD   2 tablet at 01/24/21 [id number]   HYDROcodone-acetaminophen (NORCO/VICODIN) 5-325 MG per tablet 1-2 tablet  1-2 tablet Oral Q4H PRN Rod Can, MD   2 tablet at 01/26/21 1144   HYDROmorphone (DILAUDID) injection 1 mg  1 mg Intravenous Once Rod Can,  MD       lip balm (CARMEX) ointment   Topical PRN Charlyne Petrin, PA-C   1 application at [id number]   menthol-cetylpyridinium (CEPACOL) lozenge 3 mg  1 lozenge Oral PRN Swinteck, Aaron Edelman, MD       Or   phenol (CHLORASEPTIC) mouth spray 1 spray  1 spray Mouth/Throat PRN Swinteck, Aaron Edelman, MD       methocarbamol (ROBAXIN) tablet 500 mg  500 mg Oral Q6H PRN Rod Can, MD   500 mg at 01/26/21 0940   metoCLOPramide (REGLAN) tablet 5-10 mg  5-10 mg Oral Q8H PRN Swinteck, Aaron Edelman, MD       Or   metoCLOPramide (REGLAN) injection 5-10 mg  5-10 mg Intravenous Q8H PRN Swinteck, Aaron Edelman, MD        multivitamin (PROSIGHT) tablet 1 tablet  1 tablet Oral BID Rod Can, MD   1 tablet at 01/26/21 0940   multivitamin with minerals tablet 1 tablet  1 tablet Oral Daily Swinteck, Aaron Edelman, MD   1 tablet at 01/26/21 0941   ondansetron (ZOFRAN) tablet 4 mg  4 mg Oral Q6H PRN Swinteck, Aaron Edelman, MD       Or   ondansetron [facility]) injection 4 mg  4 mg Intravenous Q6H PRN Rod Can, MD   4 mg at 01/21/21 2223   polyethylene glycol (MIRALAX / GLYCOLAX) packet 17 g  17 g Oral Daily PRN Rod Can, MD   17 g at 01/22/21 2241     Discharge Medications: Please see discharge summary for a list of discharge medications.  Relevant Imaging Results:  Relevant Lab Results:   Additional Information [phone]  Jakaya Jacobowitz, LCSW

## 2021-01-26 NOTE — Progress Notes (Signed)
Subjective: 5 Days Post-Op Procedure(s) (LRB): TOTAL KNEE REVISION BOTH COMPONENTS (Right) PATELLA TENDON REPAIR (Right) Patient reports pain as mild.    Objective: Vital signs in last 24 hours: Temp:  [98.2 F (36.8 C)-98.3 F (36.8 C)] 98.3 F (36.8 C) (08/08 0508) Pulse Rate:  [78-79] 79 (08/08 0508) Resp:  [17-18] 17 (08/08 0508) BP: (125-137)/(63-70) 137/65 (08/08 0508) SpO2:  [94 %-98 %] 97 % (08/08 0508)  Intake/Output from previous day: 08/07 0701 - 08/08 0700 In: 53 [P.O.:420] Out: 2640 [Urine:2600; Drains:40] Intake/Output this shift: Total I/O In: -  Out: 250 [Urine:250]  Recent Labs    01/24/21 0334  HGB 7.8*    Recent Labs    01/24/21 0334  WBC 9.7  RBC 2.74*  HCT 25.2*  PLT 155    Recent Labs    01/25/21 0227 01/26/21 0312  NA 137 136  K 3.8 3.7  CL 108 104  CO2 25 24  BUN 19 17  CREATININE 0.80 0.93  GLUCOSE 95 99  CALCIUM 7.8* 8.0*    No results for input(s): LABPT, INR in the last 72 hours.  Dorsiflexion/Plantar flexion intact Splint intact with VAC functioning well   Assessment/Plan: 5 Days Post-Op Procedure(s) (LRB): TOTAL KNEE REVISION BOTH COMPONENTS (Right) PATELLA TENDON REPAIR (Right)  Await SNF, hopefully placement today   Harry [name] 01/26/2021, 9:39 AM

## 2021-01-26 NOTE — Progress Notes (Signed)
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  Pt with hx of arthritis, HTN, and obesity    PT Comments    POD # 5 pm session Pt just finished lunch.  "Oh good, someone told me you left".   Assisted back to bed with NT.  General transfer comment: from recliner required increased assist.   assisted with standing using Bariatric walker + 2 side by side assist fro safety.  X 3 attempts to fully achieve upright posture.  Then assist 1/4 turn to his LEFT back to bed..  Difficulty turning walker due to excessive force B UE's.  50% VC's on proper turn sequencving and proper walker to self placement. bed pulled partially up to pt as he was unable to complete full turn due to instability. General bed mobility comments: assisted back to bed + 2 assist and Max Assist to support R LE up onto bed and position with pillows. Applied ICE.  Pt will need ST Rehab at SNF prior to safely returning home.    Follow Up Recommendations  SNF     Equipment Recommendations  None recommended by PT    Recommendations for Other Services       Precautions / Restrictions Precautions Precautions: Fall;Other (comment) Precaution Comments: No ROM at R knee Restrictions Weight Bearing Restrictions: Yes RLE Weight Bearing: Touchdown weight bearing    Mobility  Bed Mobility Overal bed mobility: Needs Assistance Bed Mobility: Sit to Supine     Supine to sit: Min assist;Mod assist     General bed mobility comments: assisted back to bed + 2 assist and Max Assist to support R LE up onto bed and position with pillows.    Transfers Overall transfer level: Needs assistance Equipment used: Rolling walker (2 wheeled) Transfers: Sit to/from [facility] Sit to Stand: Mod assist;+2 physical  assistance;From elevated surface Stand pivot transfers: Max assist;+2 physical assistance;From elevated surface       General transfer comment: from recliner required increased assist.   assisted with standing using Bariatric walker + 2 side by side assist fro safety.  X 3 attempts to fully achieve upright posture.  Then assist 1/4 turn to his LEFT back to bed..  Difficulty turning walker due to excessive force B UE's.  50% VC's on proper turn sequencving and proper walker to self placement. bed pulled partially up to pt as he was unable to complete full turn due to instability.  Ambulation/Gait             General Gait Details: transfers only due to TTWB and level of difficulty with transfers   Stairs             Wheelchair Mobility    Modified Rankin (Stroke Patients Only)       Balance                                            Cognition Arousal/Alertness: Awake/alert Behavior During Therapy: WFL for tasks assessed/performed;Anxious Overall Cognitive Status: Within Functional Limits for tasks assessed  General Comments: AoX x 3 very willing and pleasant.  Some anxiety "don't hurt my leg" pt guarding      Exercises      General Comments        Pertinent Vitals/Pain Pain Assessment: Faces Faces Pain Scale: Hurts even more Pain Location: R knee Pain Descriptors / Indicators: Burning;Sore;Spasm Pain Intervention(s): Monitored during session;Premedicated before session;Repositioned;Ice applied    Home Living                      Prior Function            PT Goals (current goals can now be found in the care plan section) Progress towards PT goals: Progressing toward goals    Frequency    Min 6X/week      PT Plan Current plan remains appropriate    Co-evaluation              AM-PAC PT "6 Clicks" Mobility   Outcome Measure  Help needed turning from your back to  your side while in a flat bed without using bedrails?: A Lot Help needed moving from lying on your back to sitting on the side of a flat bed without using bedrails?: A Lot Help needed moving to and from a bed to a chair (including a wheelchair)?: A Lot Help needed standing up from a chair using your arms (e.g., wheelchair or bedside chair)?: A Lot   Help needed climbing 3-5 steps with a railing? : Total 6 Click Score: 9    End of Session Equipment Utilized During Treatment: Gait belt Activity Tolerance: No increased pain;Patient limited by fatigue Patient left: in chair;with call bell/phone within reach Nurse Communication: Mobility status PT Visit Diagnosis: Other abnormalities of gait and mobility (R26.89);Muscle weakness (generalized) (M62.81);Pain;Difficulty in walking, not elsewhere classified (R26.2) Pain - Right/Left: Right Pain - part of body: Knee     Time: [id number] PT Time Calculation (min) (ACUTE ONLY): 15 min  Charges:  $Therapeutic Activity: 8-22 mins                     {Genese Quebedeaux  PTA Acute  Rehabilitation Services Pager      [phone] Office      [phone]

## 2021-01-26 NOTE — Progress Notes (Signed)
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  Pt with hx of arthritis, HTN, and obesity    PT Comments    POD # 5 am session General Comments: A&O  x 3 very willing and pleasant.  Some anxiety "don't hurt my leg" pt guardingGeneral bed mobility comments: Mod assist to support R LE off bed with increased time to complete scooting.  Increased c/o R knee pain with leg downward in dependent position.  Pt is nervous/anxious throughout session.  "I don't want anything to happen again that will set me back".  Demonstared and instructed pt how to use a belt to self asssit R LE.  EOB x 4 min to "calm my nerves". General transfer comment: from elavted bed assisted with standing using Bariatric walker + 2 side by side assist fro safety.  X 3 attempts to fully achieve upright posture.  Then assist 1/4 turn to his LEFT to recliner.  Difficulty turning walker due to excessive force B UE's.  50% VC's on proper turn sequencving and proper walker to self placement. Performed 10 reps TE's of R hip ABb/ADd and SLR AAROM Applied ICE I will return after lunch to assist pt back to bed due to level of difficulty and pt anxiety.   Follow Up Recommendations  SNF     Equipment Recommendations  None recommended by PT    Recommendations for Other Services       Precautions / Restrictions Precautions Precautions: Fall;Other (comment) Precaution Comments: No ROM at R knee Restrictions Weight Bearing Restrictions: Yes RLE Weight Bearing: Touchdown weight bearing    Mobility  Bed Mobility Overal bed mobility: Needs Assistance Bed Mobility: Supine to Sit     Supine to sit: Min assist;Mod assist     General bed mobility comments: Mod assist to support R LE off bed with increased time to complete  scooting.  Increased c/o R knee pain with leg downward in dependent position.  Pt is nervous/anxious throughout session.  "I don't want anything to happen again that will set me back".  Demonstared and instructed pt how to use a belt to self asssit R LE.  EOB x 4 min to "calm my nerves".    Transfers Overall transfer level: Needs assistance Equipment used: Rolling walker (2 wheeled) (Bariatric) Transfers: Sit to/from [facility] Sit to Stand: Mod assist;+2 physical assistance;From elevated surface Stand pivot transfers: Max assist;+2 physical assistance;From elevated surface       General transfer comment: from elavted bed assisted with standing using Bariatric walker + 2 side by side assist fro safety.  X 3 attempts to fully achieve upright posture.  Then assist 1/4 turn to his LEFT to recliner.  Difficulty turning walker due to excessive force B UE's.  50% VC's on proper turn sequencving and proper walker to self placement.  Ambulation/Gait             General Gait Details: transfers only due to TTWB and level of difficulty with transfers   Stairs             Wheelchair Mobility    Modified Rankin (Stroke Patients Only)       Balance  Cognition Arousal/Alertness: Awake/alert Behavior During Therapy: WFL for tasks assessed/performed;Anxious Overall Cognitive Status: Within Functional Limits for tasks assessed                                 General Comments: AoX x 3 very willing and pleasant.  Some anxiety "don't hurt my leg" pt guarding      Exercises      General Comments        Pertinent Vitals/Pain Pain Assessment: Faces Faces Pain Scale: Hurts even more Pain Location: R knee Pain Descriptors / Indicators: Burning;Sore;Spasm Pain Intervention(s): Monitored during session;Premedicated before session;Repositioned;Ice applied    Home Living                       Prior Function            PT Goals (current goals can now be found in the care plan section) Progress towards PT goals: Progressing toward goals    Frequency    Min 6X/week      PT Plan Current plan remains appropriate    Co-evaluation              AM-PAC PT "6 Clicks" Mobility   Outcome Measure  Help needed turning from your back to your side while in a flat bed without using bedrails?: A Lot Help needed moving from lying on your back to sitting on the side of a flat bed without using bedrails?: A Lot Help needed moving to and from a bed to a chair (including a wheelchair)?: A Lot Help needed standing up from a chair using your arms (e.g., wheelchair or bedside chair)?: A Lot   Help needed climbing 3-5 steps with a railing? : Total 6 Click Score: 9    End of Session Equipment Utilized During Treatment: Gait belt Activity Tolerance: No increased pain;Patient limited by fatigue Patient left: in chair;with call bell/phone within reach Nurse Communication: Mobility status PT Visit Diagnosis: Other abnormalities of gait and mobility (R26.89);Muscle weakness (generalized) (M62.81);Pain;Difficulty in walking, not elsewhere classified (R26.2) Pain - Right/Left: Right Pain - part of body: Knee     Time: 1130-1145 PT Time Calculation (min) (ACUTE ONLY): 15 min  Charges:  $Therapeutic Activity: 8-22 mins                     Rica Koyanagi  PTA Acute  Rehabilitation Services Pager      [phone] Office      [phone]

## 2021-01-27 LAB — BASIC METABOLIC PANEL
Anion gap: 9 (ref 5–15)
BUN: 17 mg/dL (ref 8–23)
CO2: 24 mmol/L (ref 22–32)
Calcium: 8.4 mg/dL — ABNORMAL LOW (ref 8.9–10.3)
Chloride: 104 mmol/L (ref 98–111)
Creatinine, Ser: 0.94 mg/dL (ref 0.61–1.24)
GFR, Estimated: >60 mL/min (ref >60)
Glucose, Bld: 96 mg/dL (ref 70–99)
Potassium: 3.8 mmol/L (ref 3.5–5.1)
Sodium: 137 mmol/L (ref 135–145)

## 2021-01-27 LAB — RESP PANEL BY RT-PCR (FLU A&B, COVID) ARPGX2
Influenza A by PCR: NEGATIVE
Influenza B by PCR: NEGATIVE
SARS Coronavirus 2 by RT PCR: NEGATIVE

## 2021-01-27 MED ORDER — ENOXAPARIN SODIUM 30 MG/0.3ML IJ SOSY: 30.0000 mg | PREFILLED_SYRINGE | Freq: Two times a day (BID) | INTRAMUSCULAR | 0 refills | Status: DC

## 2021-01-27 NOTE — Discharge Summary (Addendum)
Physician Discharge Summary  Patient ID: Harry NGUYENTHI MRN: [id number] DOB/AGE: 06-23-1950 70 y.o.  Admit date: 01/19/2021 Discharge date: 01/27/2021  Admission Diagnoses:  Knee dislocation, right. 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 male who was admitted 01/19/2021 with a diagnosis of right knee dislocation status post right total knee arthroplasty and right patellar tendon rupture and went to the operating room on 01/21/2021 and underwent the above named procedures.    He was given perioperative antibiotics:  Anti-infectives (From admission, onward)    Start     Dose/Rate Route Frequency Ordered Stop   01/22/21 0300  ceFAZolin (ANCEF) IVPB 2g/100 mL premix  Status:  Discontinued        2 g 200 mL/hr over 30 Minutes Intravenous Every 6 hours 01/21/21 2126 01/22/21 0113   01/22/21 0300  ceFAZolin (ANCEF) IVPB 2g/100 mL premix        2 g 200 mL/hr over 30 Minutes Intravenous  Once 01/22/21 0113 01/22/21 0612   01/21/21 2111  ceFAZolin (ANCEF) 2-4 GM/100ML-% IVPB       Note to Pharmacy: Spenski, Amy   : cabinet override      01/21/21 2111 01/21/21 2115   01/21/21 2007  ceFAZolin (ANCEF) 1-4 GM/50ML-% IVPB  Status:  Discontinued       Note to Pharmacy: Benjamin Stain   : cabinet override      01/21/21 2007 01/21/21 2113   01/21/21 1945  ceFAZolin (ANCEF) IVPB 2g/100 mL premix  Status:  Discontinued        2 g 200 mL/hr over 30 Minutes Intravenous Every 6 hours 01/21/21 1939 01/21/21 2126   01/21/21 1650  ceFAZolin (ANCEF)  3-0.9 GM/100ML-% IVPB       Note to Pharmacy: Georgena Spurling   : cabinet override      01/21/21 1650 01/21/21 1655   01/21/21 1533  vancomycin (VANCOCIN) powder  Status:  Discontinued          As needed 01/21/21 1533 01/21/21 1549   01/21/21 1533  tobramycin (NEBCIN) powder  Status:  Discontinued          As needed 01/21/21 1533 01/21/21 1549   01/21/21 1100  ceFAZolin (ANCEF) IVPB 3g/100 mL premix  Status:  Discontinued        3 g 200 mL/hr over 30 Minutes Intravenous On call to O.R. 01/20/21 0849 01/20/21 2146   01/21/21 1100  vancomycin (VANCOREADY) IVPB 1500 mg/300 mL  Status:  Discontinued        1,500 mg 150 mL/hr over 120 Minutes Intravenous On call to O.R. 01/20/21 0849 01/20/21 2146   01/21/21 1045  vancomycin (VANCOREADY) IVPB 1500 mg/300 mL        1,500 mg 150 mL/hr over 120 Minutes Intravenous  Once 01/21/21 1033 01/21/21 1315   01/21/21 1045  ceFAZolin (ANCEF) IVPB 3g/100 mL premix        3 g 200 mL/hr over 30 Minutes Intravenous  Once 01/21/21 1033 01/21/21 1722   01/21/21 1035  ceFAZolin (ANCEF) 3-0.9  GM/100ML-% IVPB       Note to Pharmacy: Karsten Ro   : cabinet override      01/21/21 1035 01/21/21 1249   01/21/21 1029  ceFAZolin (ANCEF) 2-4 GM/100ML-% IVPB  Status:  Discontinued       Note to Pharmacy: Karsten Ro   : cabinet override      01/21/21 1029 01/21/21 1037     .  He was given sequential compression devices, early ambulation, and lovenox for DVT prophylaxis.  A Prevena wound VAC was utilized during the patient's hospital stay.  This was changed to a portable unit prior to discharge.  A JP drain has been in place and will stay in place until his first follow-up.    He benefited maximally from the hospital stay and there were no complications.    Recent vital signs:  Vitals:   01/27/21 1317 01/27/21 1319  BP: 136/62   Pulse: (!) 41 (!) 50  Resp: 19   Temp: 99.1 F (37.3 C)   SpO2: 97% 98%    Recent laboratory studies:  Lab Results   Component Value Date   HGB 7.8 (L) 01/24/2021   HGB 7.2 (L) 01/23/2021   HGB 9.0 (L) 01/22/2021   Lab Results  Component Value Date   WBC 9.7 01/24/2021   PLT 155 01/24/2021   Lab Results  Component Value Date   INR 1.1 12/26/2020   Lab Results  Component Value Date   NA 137 01/27/2021   K 3.8 01/27/2021   CL 104 01/27/2021   CO2 24 01/27/2021   BUN 17 01/27/2021   CREATININE 0.94 01/27/2021   GLUCOSE 96 01/27/2021     WEIGHT BEARING   Partial weight bearing with assist device as directed.  25% Right lower extremity   EXERCISES  Results after joint replacement surgery are often greatly improved when you follow the exercise, range of motion and muscle strengthening exercises prescribed by your doctor. Safety measures are also important to protect the joint from further injury. Any time any of these exercises cause you to have increased pain or swelling, decrease what you are doing until you are comfortable again and then slowly increase them. If you have problems or questions, call your caregiver or physical therapist for advice.   Rehabilitation is important following a joint replacement. After just a few days of immobilization, the muscles of the leg can become weakened and shrink (atrophy).  These exercises are designed to build up the tone and strength of the thigh and leg muscles and to improve motion. Often times heat used for twenty to thirty minutes before working out will loosen up your tissues and help with improving the range of motion but do not use heat for the first two weeks following surgery (sometimes heat can increase post-operative swelling).   These exercises can be done on a training (exercise) mat, on the floor, on a table or on a bed. Use whatever works the best and is most comfortable for you.    Use music or television while you are exercising so that the exercises are a pleasant break in your day. This will make your life better with the exercises  acting as a break in your routine that you can look forward to.   Perform all exercises about fifteen times, three times per day or as directed.  You should exercise both the operative leg and the other leg as well.  Exercises include:   Quad Sets - Tighten up the  muscle on the front of the thigh (Quad) and hold for 5-10 seconds.   Straight Leg Raises - With your knee straight (if you were given a brace, keep it on), lift the leg to 60 degrees, hold for 3 seconds, and slowly lower the leg.  Perform this exercise against resistance later as your leg gets stronger.  Leg Slides: Lying on your back, slowly slide your foot toward your buttocks, bending your knee up off the floor (only go as far as is comfortable). Then slowly slide your foot back down until your leg is flat on the floor again.  Angel Wings: Lying on your back spread your legs to the side as far apart as you can without causing discomfort.  Hamstring Strength:  Lying on your back, push your heel against the floor with your leg straight by tightening up the muscles of your buttocks.  Repeat, but this time bend your knee to a comfortable angle, and push your heel against the floor.  You may put a pillow under the heel to make it more comfortable if necessary.   A rehabilitation program following joint replacement surgery can speed recovery and prevent re-injury in the future due to weakened muscles. Contact your doctor or a physical therapist for more information on knee rehabilitation.    CONSTIPATION  Constipation is defined medically as fewer than three stools per week and severe constipation as less than one stool per week.  Even if you have a regular bowel pattern at home, your normal regimen is likely to be disrupted due to multiple reasons following surgery.  Combination of anesthesia, postoperative narcotics, change in appetite and fluid intake all can affect your bowels.   YOU MUST use at least one of the following options; they  are listed in order of increasing strength to get the job done.  They are all available over the counter, and you may need to use some, POSSIBLY even all of these options:    Drink plenty of fluids (prune juice may be helpful) and high fiber foods Colace 100 mg by mouth twice a day  Senokot for constipation as directed and as needed Dulcolax (bisacodyl), take with full glass of water  Miralax (polyethylene glycol) once or twice a day as needed.  If you have tried all these things and are unable to have a bowel movement in the first 3-4 days after surgery call either your surgeon or your primary doctor.    If you experience loose stools or diarrhea, hold the medications until you stool forms back up.  If your symptoms do not get better within 1 week or if they get worse, check with your doctor.  If you experience "the worst abdominal pain ever" or develop nausea or vomiting, please contact the office immediately for further recommendations for treatment.   ITCHING:  If you experience itching with your medications, try taking only a single pain pill, or even half a pain pill at a time.  You can also use Benadryl over the counter for itching or also to help with sleep.   TED HOSE STOCKINGS:  Use stockings on left legs until for at least 2 weeks or as directed by physician office. They may be removed at night for sleeping.  MEDICATIONS:  See your medication summary on the "After Visit Summary" that nursing will review with you.  You may have some home medications which will be placed on hold until you complete the course of blood thinner medication.  It is  important for you to complete the blood thinner medication as prescribed.  PRECAUTIONS:  If you experience chest pain or shortness of breath - call 911 immediately for transfer to the hospital emergency department.   If you develop a fever greater that 101 F, purulent drainage from wound, increased redness or drainage from wound, foul odor from the  wound/dressing, or calf pain - CONTACT YOUR SURGEON.                                                   FOLLOW-UP APPOINTMENTS:  If you do not already have a post-op appointment, please call the office for an appointment to be seen by your surgeon.  Guidelines for how soon to be seen are listed in your "After Visit Summary", but are typically between 1-4 weeks after surgery.  Dental Antibiotics:  In most cases prophylactic antibiotics for Dental procdeures after total joint surgery are not necessary.  Exceptions are as follows:  1. History of prior total joint infection  2. Severely immunocompromised (Organ Transplant, cancer chemotherapy, Rheumatoid biologic meds such as [place])  3. Poorly controlled diabetes (A1C &gt; 8.0, blood glucose over 200)  If you have one of these conditions, contact your surgeon for an antibiotic prescription, prior to your dental procedure.   OTHER INSTRUCTIONS:   Knee Replacement:  Do not place pillow under knee, focus on keeping the knee straight while resting. CPM instructions: 0-90 degrees, 2 hours in the morning, 2 hours in the afternoon, and 2 hours in the evening. Place foam block, curve side up under heel at all times except when in CPM or when walking.  DO NOT modify, tear, cut, or change the foam block in any way.   MAKE SURE YOU:  Understand these instructions.  Get help right away if you are not doing well or get worse.    Thank you for letting us be a part of your medical care team.  It is a privilege we respect greatly.  We hope these instructions will help you stay on track for a fast and full recovery!   Diagnostic Studies: DG Knee 1-2 Views Right  Result Date: 01/20/2021 CLINICAL DATA:  Right knee pain. Patient reports knee feels dislocated. EXAM: RIGHT KNEE - 1-2 VIEW COMPARISON:  Radiographs yesterday. FINDINGS: Recurrent dislocation of right knee arthroplasty. The femoral component is displaced anteriorly with respect to the tibial  component. There is been patellar resurfacing. Patellar Alta. Anterior skin staples in place. IMPRESSION: Recurrent dislocation of right knee arthroplasty. Electronically Signed   By: Keith Rake M.D.   On: 01/20/2021 18:36   DG Knee 2 Views Right  Result Date: 01/19/2021 CLINICAL DATA:  Post reduction knee dislocation. EXAM: RIGHT KNEE - 1-2 VIEW COMPARISON:  Radiographs earlier the same date and 01/07/2021. FINDINGS: Interval reduction of the posterior knee dislocation status post recent total knee arthroplasty. The hardware appears intact without loosening. However, there is suspicion of a possible nondisplaced fracture of the distal femur, only visualized on the lateral view. There is a knee joint effusion with anterior soft tissue swelling. Anterior skin staples are in place. IMPRESSION: Successful reduction of knee dislocation. Cannot exclude a nondisplaced fracture of the distal femoral metadiaphysis. Recommend CT for further evaluation. Electronically Signed   By: Richardean Sale M.D.   On: 01/19/2021 18:18   DG Knee 2 Views  Right  Result Date: 01/19/2021 CLINICAL DATA:  Fall EXAM: RIGHT KNEE - 1-2 VIEW COMPARISON:  None. FINDINGS: Anterior knee dislocation. Status post total right knee arthroplasty. Hardware is intact with no evidence of perihardware fracture or lucency. Overlying skin closure staples noted. Soft tissue edema. IMPRESSION: Anterior knee hardware dislocation. Electronically Signed   By: Yetta Glassman MD   On: 01/19/2021 15:08   DG Knee Right Port  Result Date: 01/21/2021 CLINICAL DATA:  Postop EXAM: PORTABLE RIGHT KNEE - 1-2 VIEW COMPARISON:  01/20/2021 FINDINGS: Interval revision of previously noted right knee arthroplasty with normal alignment. Gas in the soft tissues consistent with recent surgery. Drainage catheter in the soft tissues IMPRESSION: Interval revision of right knee replacement with expected postsurgical change Electronically Signed   By: Donavan Foil M.D.    On: 01/21/2021 19:22   DG Knee Right Port  Result Date: 01/20/2021 CLINICAL DATA:  Reduction of right knee dislocation EXAM: PORTABLE RIGHT KNEE - 1-2 VIEW COMPARISON:  01/20/2021 FINDINGS: Frontal and cross-table lateral views of the right knee are obtained. Interval reduction of the prior right knee dislocation, with anatomic alignment of the 3 component right knee arthroplasty. Persistent patella Alta. As described on the prior exam 01/19/2021, there is concern for a periprosthetic distal right femoral metaphyseal fracture, with cortical discontinuity seen along the dorsal cortex. CT may be useful for further evaluation. Postsurgical changes are seen in the soft tissues. Diffuse soft tissue edema. IMPRESSION: 1. Interval reduction of prior right knee arthroplasty dislocation. Persistent patella Alta, but otherwise anatomic alignment. 2. Nondisplaced periprosthetic distal right femoral metaphyseal fracture cannot be excluded. CT may be useful for further evaluation. 3. Diffuse soft tissue edema. Electronically Signed   By: Randa Ngo M.D.   On: 01/20/2021 21:06   DG Knee Right Port  Result Date: 01/07/2021 CLINICAL DATA:  Knee arthroplasty EXAM: PORTABLE RIGHT KNEE - 1-2 VIEW COMPARISON:  None. FINDINGS: Status post right knee replacement with intact hardware and normal alignment. Small linear lucencies at the posterior femoral cortex above the prosthesis. Gas in the soft tissues consistent with recent surgery IMPRESSION: 1. Right knee replacement with gas in the soft tissues consistent with recent surgery 2. Faint linear lucencies overlying posterior cortex of distal femur about a cm superior to the prosthetic, possibly due to superimposition of soft tissue gas artifact, attention on follow-up imaging. Electronically Signed   By: Donavan Foil M.D.   On: 01/07/2021 16:44    Disposition: Discharge disposition: 03-Skilled Nursing Facility      Discharge Instructions     Call MD / Call 911    Complete by: As directed    If you experience chest pain or shortness of breath, CALL 911 and be transported to the hospital emergency room.  If you develope a fever above 101 F, pus (white drainage) or increased drainage or redness at the wound, or calf pain, call your surgeon's office.   Constipation Prevention   Complete by: As directed    Drink plenty of fluids.  Prune juice may be helpful.  You may use a stool softener, such as Colace (over the counter) 100 mg twice a day.  Use MiraLax (over the counter) for constipation as needed.   Diet - low sodium heart healthy   Complete by: As directed    Do not put a pillow under the knee. Place it under the heel.   Complete by: As directed    Driving restrictions   Complete by: As directed  No driving for 6 weeks   Lifting restrictions   Complete by: As directed    No lifting for 6 weeks   Partial weight bearing   Complete by: As directed    % Body Weight: 25   Laterality: right   Extremity: Lower   Post-operative opioid taper instructions:   Complete by: As directed    POST-OPERATIVE OPIOID TAPER INSTRUCTIONS: It is important to wean off of your opioid medication as soon as possible. If you do not need pain medication after your surgery it is ok to stop day one. Opioids include: Codeine, Hydrocodone(Norco, Vicodin), Oxycodone(Percocet, oxycontin) and hydromorphone amongst others.  Long term and even short term use of opiods can cause: Increased pain response Dependence Constipation Depression Respiratory depression And more.  Withdrawal symptoms can include Flu like symptoms Nausea, vomiting And more Techniques to manage these symptoms Hydrate well Eat regular healthy meals Stay active Use relaxation techniques(deep breathing, meditating, yoga) Do Not substitute Alcohol to help with tapering If you have been on opioids for less than two weeks and do not have pain than it is ok to stop all together.  Plan to wean off of  opioids This plan should start within one week post op of your joint replacement. Maintain the same interval or time between taking each dose and first decrease the dose.  Cut the total daily intake of opioids by one tablet each day Next start to increase the time between doses. The last dose that should be eliminated is the evening dose.      TED hose   Complete by: As directed    Use stockings (TED hose) for 2 weeks on left leg(s).  You may remove them at night for sleeping.      Allergies as of 01/27/2021   No Known Allergies      Medication List     STOP taking these medications    aspirin 81 MG chewable tablet       TAKE these medications    atorvastatin 20 MG tablet Commonly known as: LIPITOR Take 20 mg by mouth daily.   docusate sodium 100 MG capsule Commonly known as: COLACE Take 1 capsule (100 mg total) by mouth 2 (two) times daily.   enoxaparin 30 MG/0.3ML injection Commonly known as: LOVENOX Inject 0.3 mLs (30 mg total) into the skin every 12 (twelve) hours.   HYDROcodone-acetaminophen 7.5-325 MG tablet Commonly known as: NORCO Take 1-2 tablets by mouth every 4 (four) hours as needed for severe pain (pain score 7-10). Notes to patient: Last dose given 08/09 10:46am   lisinopril-hydrochlorothiazide 20-12.5 MG tablet Commonly known as: ZESTORETIC Take 1 tablet by mouth daily. Notes to patient: Resume home regimen   LUBRICATING EYE DROPS OP Place 1 drop into both eyes 2 (two) times daily. Notes to patient: Resume home regimen   multivitamin with minerals Tabs tablet Take 1 tablet by mouth daily.   naproxen 500 MG tablet Commonly known as: NAPROSYN Take 500 mg by mouth 2 (two) times daily with a meal. Notes to patient: Resume home regimen   omeprazole 20 MG capsule Commonly known as: PRILOSEC Take 20 mg by mouth daily.   ondansetron 4 MG tablet Commonly known as: ZOFRAN Take 1 tablet (4 mg total) by mouth every 6 (six) hours as needed for  nausea. Notes to patient: Last dose given 08/03 10:23pm   oxyCODONE 5 MG immediate release tablet Commonly known as: Oxy IR/ROXICODONE Take 1 tablet (5 mg total) by mouth every  4 (four) hours as needed for moderate pain (pain score 4-6). Notes to patient: Last dose given 08/03 10:15pm   PreserVision AREDS 2 Caps Take 1 capsule by mouth 2 (two) times daily.   PSYLLIUM PO Take 1 Dose by mouth daily. Notes to patient: Resume home regimen   senna 8.6 MG Tabs tablet Commonly known as: SENOKOT Take 2 tablets (17.2 mg total) by mouth at bedtime.               Discharge Care Instructions  (From admission, onward)           Start     Ordered   01/27/21 0000  Partial weight bearing       Question Answer Comment  % Body Weight 25   Laterality right   Extremity Lower      01/27/21 1302              Contact information for follow-up providers     Rod Can, MD. Schedule an appointment as soon as possible for a visit on 02/03/2021.   Specialty: Orthopedic Surgery Why: [facility] removal Contact information: [address] [place] [address] [phone]              Contact information for after-discharge care     Destination     [facility] Preferred SNF .   Service: Skilled Nursing Contact information: [place] [phone]                      Signed: Dorothyann Peng 01/27/2021, 1:23 PM

## 2021-01-27 NOTE — TOC Transition Note (Signed)
Transition of Care [facility]) - CM/SW Discharge Note   Patient Details  Name: Harry [name] MRN: [id number] Date of Birth: 10-Jan-1951  [facility]) CM/SW Contact:  Lennart Pall, LCSW Phone Number: 01/27/2021, 1:53 PM   Clinical Narrative:    Pt medically cleared for dc to SNF today and insurance authorization received.  Pt and wife have accepted bed offer from [facility] who can admit today.  PTAR called ~ [phone].  RN to call report to [phone].  No further TOC needs.   Final next level of care: Skilled Nursing Facility Barriers to Discharge: Barriers Resolved   Patient Goals and CMS Choice     Choice offered to / list presented to : Patient  Discharge Placement PASRR number recieved: 01/23/21            Patient chooses bed at: [facility] Patient to be transferred to facility by: [facility] Name of family member notified: wife, Langley Gauss Patient and family notified of of transfer: 01/27/21  Discharge Plan and Services   Discharge Planning Services: CM Consult Post Acute Care Choice: [place]                               Social Determinants of Health (SDOH) Interventions     Readmission Risk Interventions No flowsheet data found.

## 2021-01-27 NOTE — Progress Notes (Signed)
Physical Therapy Treatment Patient Details Name: Harry [name] MRN: [id number] DOB: 1951/03/21 Today's Date: 01/27/2021    History of Present Illness Pt is 70 yo male s/p R TKA on 12/2020 and now s/p R TKR revision with patellar tendon repair.  Pt with hx of arthritis, HTN, and obesity    PT Comments    POD # 6 pm session Pt tolerated OOB in recliner just over three hours.  Assisted back to bed required + 2 assist. General transfer comment: assisted back to bed by performing sit to stand + 2 side by side then switching recliner with bed and pulling it up to him. General bed mobility comments: assisted back to bed by supporting R LE Min Assist.  Elevated R LE and applied ICE.   Follow Up Recommendations  SNF     Equipment Recommendations  None recommended by PT    Recommendations for Other Services       Precautions / Restrictions Precautions Precautions: Fall;Other (comment) Precaution Comments: No ROM at R knee Restrictions Weight Bearing Restrictions: Yes RLE Weight Bearing: Touchdown weight bearing    Mobility  Bed Mobility Overal bed mobility: Needs Assistance Bed Mobility: Sit to Supine     Supine to sit: Min assist;Mod assist Sit to supine: Min assist   General bed mobility comments: assisted back to bed by supporting R LE Min Assist    Transfers Overall transfer level: Needs assistance Equipment used: Rolling walker (2 wheeled) Transfers: Sit to/from [facility] Sit to Stand: Mod assist;+2 physical assistance;From elevated surface Stand pivot transfers: Max assist;+2 physical assistance;From elevated surface       General transfer comment: assisted back to bed by performing sit to stand + 2 side by side then switching recliner with bed and pulling it up to him.  Ambulation/Gait             General Gait Details: transfers only due to TTWB and level of difficulty with transfers   Stairs             Wheelchair Mobility     Modified Rankin (Stroke Patients Only)       Balance                                            Cognition Arousal/Alertness: Awake/alert Behavior During Therapy: WFL for tasks assessed/performed;Anxious Overall Cognitive Status: Within Functional Limits for tasks assessed                                 General Comments: AoX x 3 very willing and pleasant.  Some anxiety "I hate hospitals"      Exercises      General Comments        Pertinent Vitals/Pain Pain Assessment: Faces Faces Pain Scale: Hurts little more Pain Location: R knee Pain Descriptors / Indicators: Burning;Sore;Spasm;Operative site guarding Pain Intervention(s): Monitored during session;Premedicated before session;Repositioned    Home Living                      Prior Function            PT Goals (current goals can now be found in the care plan section) Progress towards PT goals: Progressing toward goals    Frequency  PT Plan Current plan remains appropriate    Co-evaluation              AM-PAC PT "6 Clicks" Mobility   Outcome Measure  Help needed turning from your back to your side while in a flat bed without using bedrails?: A Lot Help needed moving from lying on your back to sitting on the side of a flat bed without using bedrails?: A Lot   Help needed standing up from a chair using your arms (e.g., wheelchair or bedside chair)?: A Lot Help needed to walk in hospital room?: Total Help needed climbing 3-5 steps with a railing? : Total 6 Click Score: 8    End of Session Equipment Utilized During Treatment: Gait belt Activity Tolerance: No increased pain;Patient limited by fatigue Patient left: in bed Nurse Communication: Mobility status PT Visit Diagnosis: Other abnormalities of gait and mobility (R26.89);Muscle weakness (generalized) (M62.81);Pain;Difficulty in walking, not elsewhere classified (R26.2) Pain - Right/Left:  Right Pain - part of body: Knee     Time: 1400-1420 PT Time Calculation (min) (ACUTE ONLY): 20 min  Charges:  $Therapeutic Activity: 8-22 mins                     {Charlii Yost  PTA Acute  Rehabilitation Services Pager      [phone] Office      [phone]

## 2021-01-27 NOTE — Progress Notes (Signed)
Switched to [facility] wound vac, leakage error noted, Larry-PA came to bedside to reinforce dressing. Dreassing clean/dry/intact, no error noted to [facility] at this time.  RN called facility, gave report to [facility], all questions and concerns addressed. Pt not in acute distress, wife at bedside updated. Pt to discharge to SNF with belongings via [place].

## 2021-01-27 NOTE — Discharge Instructions (Addendum)
Charge the prevena unit each night. Drain the JP drain twice daily. Record amount drained. Bring log of amounts to follow up appointment. Keep leg in splint. 25% weight bearing RLE

## 2021-01-27 NOTE — Progress Notes (Signed)
Physical Therapy Treatment Patient Details Name: Harry [name] MRN: [id number] DOB: 12-09-1950 Today's Date: 01/27/2021    History of Present Illness Pt is 70 yo male s/p R TKA on 12/2020 and now s/p R TKR revision with patellar tendon repair.  Pt with hx of arthritis, HTN, and obesity    PT Comments    POD # 6 am session Assisted pt to EOB.  General bed mobility comments: had pt use his belt loop to self assist LE.  Pt c/o he has not had a bath since "last Wednesday".  Called NT to room and we both assisted him to [facility] where he was able to have a large BM and a wash up.  Then assisted to recliner.  General Gait Details: transfers only due to TTWB and level of difficulty with transfers Positioned to comfort elevated R LE and applied ICE.   Pt will need ST Rehab at SNF prior to safely returning home.   Follow Up Recommendations  SNF     Equipment Recommendations  None recommended by PT    Recommendations for Other Services       Precautions / Restrictions Precautions Precautions: Fall;Other (comment) Precaution Comments: No ROM at R knee Restrictions Weight Bearing Restrictions: Yes RLE Weight Bearing: Touchdown weight bearing    Mobility  Bed Mobility Overal bed mobility: Needs Assistance Bed Mobility: Supine to Sit     Supine to sit: Min assist;Mod assist     General bed mobility comments: had pt use his belt loop to self assist LE    Transfers Overall transfer level: Needs assistance Equipment used: Rolling walker (2 wheeled) Transfers: Sit to/from [facility] Sit to Stand: Mod assist;+2 physical assistance;From elevated surface Stand pivot transfers: Max assist;+2 physical assistance;From elevated surface       General transfer comment: assisted from elevated bed to [facility] then from [facility] to recliner via 1/4 pivot using Bari walker and + 2 side by sid eassist.  Ambulation/Gait             General Gait Details: transfers only due to TTWB and  level of difficulty with transfers   Stairs             Wheelchair Mobility    Modified Rankin (Stroke Patients Only)       Balance                                            Cognition Arousal/Alertness: Awake/alert Behavior During Therapy: WFL for tasks assessed/performed;Anxious Overall Cognitive Status: Within Functional Limits for tasks assessed                                 General Comments: AoX x 3 very willing and pleasant.  Some anxiety "I hate hospitals"      Exercises      General Comments        Pertinent Vitals/Pain Pain Assessment: Faces Faces Pain Scale: Hurts little more Pain Location: R knee Pain Descriptors / Indicators: Burning;Sore;Spasm;Operative site guarding Pain Intervention(s): Monitored during session;Premedicated before session;Repositioned    Home Living                      Prior Function            PT Goals (current goals can  now be found in the care plan section) Progress towards PT goals: Progressing toward goals    Frequency           PT Plan Current plan remains appropriate    Co-evaluation              AM-PAC PT "6 Clicks" Mobility   Outcome Measure  Help needed turning from your back to your side while in a flat bed without using bedrails?: A Lot Help needed moving from lying on your back to sitting on the side of a flat bed without using bedrails?: A Lot   Help needed standing up from a chair using your arms (e.g., wheelchair or bedside chair)?: A Lot Help needed to walk in hospital room?: Total Help needed climbing 3-5 steps with a railing? : Total 6 Click Score: 8    End of Session Equipment Utilized During Treatment: Gait belt Activity Tolerance: No increased pain;Patient limited by fatigue Patient left: in chair;with call bell/phone within reach Nurse Communication: Mobility status PT Visit Diagnosis: Other abnormalities of gait and mobility  (R26.89);Muscle weakness (generalized) (M62.81);Pain;Difficulty in walking, not elsewhere classified (R26.2) Pain - Right/Left: Right Pain - part of body: Knee     Time: 1020-1045 PT Time Calculation (min) (ACUTE ONLY): 25 min  Charges:  $Therapeutic Activity: 23-37 mins                     {Breandan People  PTA Acute  Rehabilitation Services Pager      [phone] Office      [phone]

## 2021-02-11 ENCOUNTER — Encounter (HOSPITAL_COMMUNITY): Payer: Self-pay | Admitting: Orthopedic Surgery

## 2021-02-12 ENCOUNTER — Encounter (HOSPITAL_COMMUNITY): Payer: Self-pay | Admitting: Orthopedic Surgery

## 2021-03-10 ENCOUNTER — Encounter (HOSPITAL_COMMUNITY): Payer: Self-pay | Admitting: Orthopedic Surgery

## 2021-03-10 ENCOUNTER — Ambulatory Visit: Payer: Self-pay | Admitting: Student

## 2021-03-10 ENCOUNTER — Other Ambulatory Visit: Payer: Self-pay

## 2021-03-10 NOTE — Progress Notes (Addendum)
For Short Stay: [place] appointment date:N/A Date of COVID positive in last 53 days: No   For Anesthesia: PCP - Buzzy Han, MD Cardiologist - N/A  Chest x-ray - N/A EKG - 12/26/20 in epic Stress Test - N/A ECHO - N/A Cardiac Cath - N/A Pacemaker/ICD device last checked:N/A  Sleep Study - N/A CPAP - N/A  Fasting Blood Sugar - N/A Checks Blood Sugar ___N/A__ times a day  Blood Thinner Instructions:N/A Aspirin Instructions:N/A Last Dose:N/A  Activity level: Can go up a flight of stairs and activities of daily living without stopping and without chest pain and/or shortness of breath    Anesthesia review: N/A  Patient denies shortness of breath, fever, cough and chest pain at PAT appointment   Patient verbalized understanding of instructions that were given to them at the PAT appointment. Patient was also instructed that they will need to review over the PAT instructions again at home before surgery.

## 2021-03-11 ENCOUNTER — Other Ambulatory Visit (HOSPITAL_COMMUNITY): Payer: Self-pay

## 2021-03-11 ENCOUNTER — Inpatient Hospital Stay (HOSPITAL_COMMUNITY)
Admission: RE | Admit: 2021-03-11 | Discharge: 2021-03-31 | DRG: 264 | Disposition: A | Discharge status: Home Health | Payer: Medicare Other | Attending: Internal Medicine | Admitting: Internal Medicine

## 2021-03-11 ENCOUNTER — Encounter (HOSPITAL_COMMUNITY)
Admission: RE | Disposition: A | Discharge status: Home Health | Payer: Self-pay | Source: Home / Self Care | Attending: Internal Medicine

## 2021-03-11 ENCOUNTER — Encounter (HOSPITAL_COMMUNITY): Payer: Self-pay | Admitting: Orthopedic Surgery

## 2021-03-11 ENCOUNTER — Inpatient Hospital Stay (HOSPITAL_COMMUNITY): Payer: Medicare Other | Admitting: Anesthesiology

## 2021-03-11 DIAGNOSIS — L89153 Pressure ulcer of sacral region, stage 3: Secondary | ICD-10-CM | POA: Diagnosis present

## 2021-03-11 DIAGNOSIS — D62 Acute posthemorrhagic anemia: Secondary | ICD-10-CM | POA: Diagnosis present

## 2021-03-11 DIAGNOSIS — R000 Tachycardia, unspecified: Secondary | ICD-10-CM | POA: Diagnosis not present

## 2021-03-11 DIAGNOSIS — Y831 Surgical operation with implant of artificial internal device as the cause of abnormal reaction of the patient, or of later complication, without mention of misadventure at the time of the procedure: Secondary | ICD-10-CM | POA: Diagnosis present

## 2021-03-11 DIAGNOSIS — I9789 Other postprocedural complications and disorders of the circulatory system, not elsewhere classified: Secondary | ICD-10-CM | POA: Diagnosis not present

## 2021-03-11 DIAGNOSIS — J449 Chronic obstructive pulmonary disease, unspecified: Secondary | ICD-10-CM | POA: Diagnosis present

## 2021-03-11 DIAGNOSIS — E785 Hyperlipidemia, unspecified: Secondary | ICD-10-CM | POA: Diagnosis present

## 2021-03-11 DIAGNOSIS — I4892 Unspecified atrial flutter: Principal | ICD-10-CM | POA: Diagnosis present

## 2021-03-11 DIAGNOSIS — I959 Hypotension, unspecified: Secondary | ICD-10-CM | POA: Diagnosis present

## 2021-03-11 DIAGNOSIS — I96 Gangrene, not elsewhere classified: Secondary | ICD-10-CM | POA: Diagnosis present

## 2021-03-11 DIAGNOSIS — G4700 Insomnia, unspecified: Secondary | ICD-10-CM | POA: Diagnosis present

## 2021-03-11 DIAGNOSIS — I493 Ventricular premature depolarization: Secondary | ICD-10-CM

## 2021-03-11 DIAGNOSIS — Z6834 Body mass index (BMI) 34.0-34.9, adult: Secondary | ICD-10-CM

## 2021-03-11 DIAGNOSIS — E669 Obesity, unspecified: Secondary | ICD-10-CM | POA: Diagnosis present

## 2021-03-11 DIAGNOSIS — D519 Vitamin B12 deficiency anemia, unspecified: Secondary | ICD-10-CM | POA: Diagnosis present

## 2021-03-11 DIAGNOSIS — I10 Essential (primary) hypertension: Secondary | ICD-10-CM | POA: Diagnosis present

## 2021-03-11 DIAGNOSIS — I480 Paroxysmal atrial fibrillation: Secondary | ICD-10-CM | POA: Diagnosis not present

## 2021-03-11 DIAGNOSIS — L8990 Pressure ulcer of unspecified site, unspecified stage: Secondary | ICD-10-CM | POA: Diagnosis present

## 2021-03-11 DIAGNOSIS — Z23 Encounter for immunization: Secondary | ICD-10-CM

## 2021-03-11 DIAGNOSIS — E876 Hypokalemia: Secondary | ICD-10-CM | POA: Diagnosis present

## 2021-03-11 DIAGNOSIS — I2510 Atherosclerotic heart disease of native coronary artery without angina pectoris: Secondary | ICD-10-CM | POA: Diagnosis present

## 2021-03-11 DIAGNOSIS — L7682 Other postprocedural complications of skin and subcutaneous tissue: Secondary | ICD-10-CM | POA: Diagnosis present

## 2021-03-11 DIAGNOSIS — L89610 Pressure ulcer of right heel, unstageable: Secondary | ICD-10-CM | POA: Diagnosis present

## 2021-03-11 DIAGNOSIS — I890 Lymphedema, not elsewhere classified: Secondary | ICD-10-CM | POA: Diagnosis present

## 2021-03-11 DIAGNOSIS — Z85828 Personal history of other malignant neoplasm of skin: Secondary | ICD-10-CM

## 2021-03-11 DIAGNOSIS — Z20822 Contact with and (suspected) exposure to covid-19: Secondary | ICD-10-CM | POA: Diagnosis present

## 2021-03-11 DIAGNOSIS — Z96651 Presence of right artificial knee joint: Secondary | ICD-10-CM | POA: Diagnosis present

## 2021-03-11 DIAGNOSIS — Z87891 Personal history of nicotine dependence: Secondary | ICD-10-CM

## 2021-03-11 HISTORY — DX: Nausea with vomiting, unspecified: R11.2

## 2021-03-11 HISTORY — DX: Unspecified macular degeneration: H35.30

## 2021-03-11 HISTORY — DX: Dry mouth, unspecified: R68.2

## 2021-03-11 HISTORY — DX: Other injury of unspecified body region, subsequent encounter: T14.8XXD

## 2021-03-11 HISTORY — PX: INCISION AND DRAINAGE OF WOUND: SHX1803

## 2021-03-11 HISTORY — DX: Other specified postprocedural states: Z98.890

## 2021-03-11 LAB — CBC
HCT: 35.1 % — ABNORMAL LOW (ref 39.0–52.0)
HCT: 31 % — ABNORMAL LOW (ref 39.0–52.0)
Hemoglobin: 10.7 g/dL — ABNORMAL LOW (ref 13.0–17.0)
Hemoglobin: 9.5 g/dL — ABNORMAL LOW (ref 13.0–17.0)
MCH: 27.7 pg (ref 26.0–34.0)
MCH: 27.8 pg (ref 26.0–34.0)
MCHC: 30.5 g/dL (ref 30.0–36.0)
MCHC: 30.6 g/dL (ref 30.0–36.0)
MCV: 90.9 fL (ref 80.0–100.0)
MCV: 90.6 fL (ref 80.0–100.0)
Platelets: 319 10*3/uL (ref 150–400)
Platelets: 289 10*3/uL (ref 150–400)
RBC: 3.86 MIL/uL — ABNORMAL LOW (ref 4.22–5.81)
RBC: 3.42 MIL/uL — ABNORMAL LOW (ref 4.22–5.81)
RDW: 16.2 % — ABNORMAL HIGH (ref 11.5–15.5)
RDW: 16.2 % — ABNORMAL HIGH (ref 11.5–15.5)
WBC: 9.6 10*3/uL (ref 4.0–10.5)
WBC: 8.6 10*3/uL (ref 4.0–10.5)
nRBC: 0 % (ref 0.0–0.2)
nRBC: 0 % (ref 0.0–0.2)

## 2021-03-11 LAB — COMPREHENSIVE METABOLIC PANEL
ALT: 18 U/L (ref 0–44)
AST: 21 U/L (ref 15–41)
Albumin: 3.7 g/dL (ref 3.5–5.0)
Alkaline Phosphatase: 138 U/L — ABNORMAL HIGH (ref 38–126)
Anion gap: 10 (ref 5–15)
BUN: 13 mg/dL (ref 8–23)
CO2: 22 mmol/L (ref 22–32)
Calcium: 8.9 mg/dL (ref 8.9–10.3)
Chloride: 105 mmol/L (ref 98–111)
Creatinine, Ser: 0.9 mg/dL (ref 0.61–1.24)
GFR, Estimated: >60 mL/min (ref >60)
Glucose, Bld: 104 mg/dL — ABNORMAL HIGH (ref 70–99)
Potassium: 3.8 mmol/L (ref 3.5–5.1)
Sodium: 137 mmol/L (ref 135–145)
Total Bilirubin: 0.9 mg/dL (ref 0.3–1.2)
Total Protein: 7.2 g/dL (ref 6.5–8.1)

## 2021-03-11 LAB — BASIC METABOLIC PANEL
Anion gap: 6 (ref 5–15)
BUN: 12 mg/dL (ref 8–23)
CO2: 25 mmol/L (ref 22–32)
Calcium: 8.5 mg/dL — ABNORMAL LOW (ref 8.9–10.3)
Chloride: 106 mmol/L (ref 98–111)
Creatinine, Ser: 0.82 mg/dL (ref 0.61–1.24)
GFR, Estimated: >60 mL/min (ref >60)
Glucose, Bld: 96 mg/dL (ref 70–99)
Potassium: 4 mmol/L (ref 3.5–5.1)
Sodium: 137 mmol/L (ref 135–145)

## 2021-03-11 LAB — PROTIME-INR
INR: 1.2 (ref 0.8–1.2)
Prothrombin Time: 14.7 seconds (ref 11.4–15.2)

## 2021-03-11 LAB — SARS CORONAVIRUS 2 BY RT PCR (HOSPITAL ORDER, PERFORMED IN ~~LOC~~ HOSPITAL LAB): SARS Coronavirus 2: NEGATIVE

## 2021-03-11 LAB — MAGNESIUM: Magnesium: 1.8 mg/dL (ref 1.7–2.4)

## 2021-03-11 SURGERY — IRRIGATION AND DEBRIDEMENT WOUND
Anesthesia: General | Laterality: Right

## 2021-03-11 MED ORDER — DOCUSATE SODIUM 100 MG PO CAPS
100.0000 mg | ORAL_CAPSULE | Freq: Two times a day (BID) | ORAL | Status: DC
  Administered 2021-03-12 – 2021-03-15 (×2): 100 mg via ORAL
  Filled 2021-03-11 (×10): qty 1

## 2021-03-11 MED ORDER — TRANEXAMIC ACID-NACL 1000-0.7 MG/100ML-% IV SOLN
1000.0000 mg | INTRAVENOUS | Status: AC
  Filled 2021-03-11: qty 100

## 2021-03-11 MED ORDER — HYDRALAZINE HCL 20 MG/ML IJ SOLN: 10.0000 mg | Freq: Four times a day (QID) | INTRAMUSCULAR | Status: DC | PRN

## 2021-03-11 MED ORDER — ORAL CARE MOUTH RINSE
15.0000 mL | Freq: Once | OROMUCOSAL | Status: AC
  Administered 2021-03-11: 15 mL via OROMUCOSAL

## 2021-03-11 MED ORDER — MIDAZOLAM HCL 2 MG/2ML IJ SOLN
INTRAMUSCULAR | Status: AC
  Filled 2021-03-11: qty 2

## 2021-03-11 MED ORDER — ATORVASTATIN CALCIUM 20 MG PO TABS
20.0000 mg | ORAL_TABLET | Freq: Every day | ORAL | Status: DC
  Administered 2021-03-11 – 2021-03-31 (×19): 20 mg via ORAL
  Filled 2021-03-11 (×19): qty 1

## 2021-03-11 MED ORDER — PHENYLEPHRINE HCL (PRESSORS) 10 MG/ML IV SOLN
INTRAVENOUS | Status: AC
  Filled 2021-03-11: qty 2

## 2021-03-11 MED ORDER — FENTANYL CITRATE (PF) 250 MCG/5ML IJ SOLN
INTRAMUSCULAR | Status: AC
  Filled 2021-03-11: qty 5

## 2021-03-11 MED ORDER — ALBUTEROL SULFATE (2.5 MG/3ML) 0.083% IN NEBU
2.5000 mg | INHALATION_SOLUTION | Freq: Four times a day (QID) | RESPIRATORY_TRACT | Status: DC
  Administered 2021-03-11: 2.5 mg via RESPIRATORY_TRACT
  Filled 2021-03-11: qty 3

## 2021-03-11 MED ORDER — CEFAZOLIN SODIUM-DEXTROSE 2-4 GM/100ML-% IV SOLN
2.0000 g | INTRAVENOUS | Status: AC
Start: 2021-03-11 — End: 2021-03-12
  Filled 2021-03-11: qty 100

## 2021-03-11 MED ORDER — SUCCINYLCHOLINE CHLORIDE 200 MG/10ML IV SOSY
PREFILLED_SYRINGE | INTRAVENOUS | Status: DC | PRN
  Administered 2021-03-11: 140 mg via INTRAVENOUS

## 2021-03-11 MED ORDER — LACTATED RINGERS IV SOLN: INTRAVENOUS | Status: DC

## 2021-03-11 MED ORDER — ACETAMINOPHEN 325 MG PO TABS
650.0000 mg | ORAL_TABLET | Freq: Four times a day (QID) | ORAL | Status: DC | PRN
  Administered 2021-03-13 – 2021-03-18 (×12): 650 mg via ORAL
  Filled 2021-03-11 (×12): qty 2

## 2021-03-11 MED ORDER — CHLORHEXIDINE GLUCONATE 0.12 % MT SOLN: 15.0000 mL | Freq: Once | OROMUCOSAL | Status: AC

## 2021-03-11 MED ORDER — FENTANYL CITRATE (PF) 100 MCG/2ML IJ SOLN
INTRAMUSCULAR | Status: DC | PRN
  Administered 2021-03-11: 100 ug via INTRAVENOUS

## 2021-03-11 MED ORDER — OXYCODONE HCL 5 MG PO TABS
5.0000 mg | ORAL_TABLET | ORAL | Status: DC | PRN
  Filled 2021-03-11: qty 1

## 2021-03-11 MED ORDER — PROPOFOL 10 MG/ML IV BOLUS
INTRAVENOUS | Status: DC | PRN
  Administered 2021-03-11: 170 mg via INTRAVENOUS

## 2021-03-11 MED ORDER — ACETAMINOPHEN 10 MG/ML IV SOLN
1000.0000 mg | Freq: Once | INTRAVENOUS | Status: AC
  Filled 2021-03-11: qty 100

## 2021-03-11 MED ORDER — VANCOMYCIN HCL 1500 MG/300ML IV SOLN
1500.0000 mg | INTRAVENOUS | Status: AC
  Administered 2021-03-11: 1500 mg via INTRAVENOUS
  Filled 2021-03-11: qty 300

## 2021-03-11 MED ORDER — PHENYLEPHRINE 40 MCG/ML (10ML) SYRINGE FOR IV PUSH (FOR BLOOD PRESSURE SUPPORT)
PREFILLED_SYRINGE | INTRAVENOUS | Status: DC | PRN
  Administered 2021-03-11: 80 ug via INTRAVENOUS
  Administered 2021-03-11: 120 ug via INTRAVENOUS
  Administered 2021-03-11 (×2): 80 ug via INTRAVENOUS

## 2021-03-11 MED ORDER — PROPOFOL 10 MG/ML IV BOLUS
INTRAVENOUS | Status: AC
  Filled 2021-03-11: qty 20

## 2021-03-11 MED ORDER — LIDOCAINE 2% (20 MG/ML) 5 ML SYRINGE
INTRAMUSCULAR | Status: DC | PRN
  Administered 2021-03-11: 100 mg via INTRAVENOUS

## 2021-03-11 MED ORDER — SENNA 8.6 MG PO TABS
1.0000 | ORAL_TABLET | Freq: Two times a day (BID) | ORAL | Status: DC
  Administered 2021-03-12 – 2021-03-15 (×2): 8.6 mg via ORAL
  Filled 2021-03-11 (×7): qty 1

## 2021-03-11 MED ORDER — ACETAMINOPHEN 650 MG RE SUPP: 650.0000 mg | Freq: Four times a day (QID) | RECTAL | Status: DC | PRN

## 2021-03-11 MED ORDER — SODIUM CHLORIDE 0.9 % IV SOLN: INTRAVENOUS | Status: DC

## 2021-03-11 MED ORDER — PHENYLEPHRINE HCL (PRESSORS) 10 MG/ML IV SOLN: INTRAVENOUS | Status: DC | PRN

## 2021-03-11 MED ORDER — PANTOPRAZOLE SODIUM 40 MG PO TBEC
40.0000 mg | DELAYED_RELEASE_TABLET | Freq: Every day | ORAL | Status: DC
  Administered 2021-03-11 – 2021-03-31 (×19): 40 mg via ORAL
  Filled 2021-03-11 (×20): qty 1

## 2021-03-11 MED ORDER — ENOXAPARIN SODIUM 60 MG/0.6ML IJ SOSY
60.0000 mg | PREFILLED_SYRINGE | INTRAMUSCULAR | Status: DC
  Administered 2021-03-12 – 2021-03-13 (×2): 60 mg via SUBCUTANEOUS
  Filled 2021-03-11 (×2): qty 0.6

## 2021-03-11 MED ORDER — ONDANSETRON HCL 4 MG/2ML IJ SOLN
INTRAMUSCULAR | Status: DC | PRN
  Administered 2021-03-11: 4 mg via INTRAVENOUS

## 2021-03-11 MED ORDER — FENTANYL CITRATE PF 50 MCG/ML IJ SOSY: 25.0000 ug | PREFILLED_SYRINGE | INTRAMUSCULAR | Status: DC | PRN

## 2021-03-11 SURGICAL SUPPLY — 42 items
BAG COUNTER SPONGE SURGICOUNT (BAG) IMPLANT
BAG ZIPLOCK 12X15 (MISCELLANEOUS) IMPLANT
BANDAGE ESMARK 6X9 LF (GAUZE/BANDAGES/DRESSINGS) IMPLANT
BNDG ESMARK 6X9 LF (GAUZE/BANDAGES/DRESSINGS)
BNDG GAUZE ELAST 4 BULKY (GAUZE/BANDAGES/DRESSINGS) IMPLANT
CHLORAPREP W/TINT 26 (MISCELLANEOUS) IMPLANT
COVER SURGICAL LIGHT HANDLE (MISCELLANEOUS) IMPLANT
CUFF TOURN SGL QUICK 18X4 (TOURNIQUET CUFF) IMPLANT
CUFF TOURN SGL QUICK 24 (TOURNIQUET CUFF)
CUFF TOURN SGL QUICK 34 (TOURNIQUET CUFF)
CUFF TRNQT CYL 24X4X16.5-23 (TOURNIQUET CUFF) IMPLANT
CUFF TRNQT CYL 34X4.125X (TOURNIQUET CUFF) IMPLANT
DRAIN PENROSE 0.5X18 (DRAIN) IMPLANT
DRSG PAD ABDOMINAL 8X10 ST (GAUZE/BANDAGES/DRESSINGS) IMPLANT
ELECT REM PT RETURN 15FT ADLT (MISCELLANEOUS) IMPLANT
GAUZE SPONGE 4X4 12PLY STRL (GAUZE/BANDAGES/DRESSINGS) IMPLANT
GLOVE SRG 8 PF TXTR STRL LF DI (GLOVE) IMPLANT
GLOVE SURG ENC TEXT LTX SZ7 (GLOVE) IMPLANT
GLOVE SURG ENC TEXT LTX SZ7.5 (GLOVE) IMPLANT
GLOVE SURG NEOPR MICRO LF SZ8 (GLOVE) IMPLANT
GLOVE SURG ORTHO LTX SZ7.5 (GLOVE) IMPLANT
GLOVE SURG ORTHO LTX SZ8 (GLOVE) IMPLANT
GLOVE SURG UNDER POLY LF SZ7.5 (GLOVE) IMPLANT
GLOVE SURG UNDER POLY LF SZ8 (GLOVE)
GLOVE SURG UNDER POLY LF SZ8.5 (GLOVE) IMPLANT
GOWN STRL REUS W/TWL LRG LVL3 (GOWN DISPOSABLE) IMPLANT
GOWN STRL REUS W/TWL XL LVL3 (GOWN DISPOSABLE) IMPLANT
HANDPIECE INTERPULSE COAX TIP (DISPOSABLE)
JET LAVAGE IRRISEPT WOUND (IRRIGATION / IRRIGATOR)
KIT BASIN OR (CUSTOM PROCEDURE TRAY) IMPLANT
KIT TURNOVER KIT A (KITS) IMPLANT
LAVAGE JET IRRISEPT WOUND (IRRIGATION / IRRIGATOR) IMPLANT
MANIFOLD NEPTUNE II (INSTRUMENTS) IMPLANT
PACK ORTHO EXTREMITY (CUSTOM PROCEDURE TRAY) IMPLANT
PAD CAST 4YDX4 CTTN HI CHSV (CAST SUPPLIES) IMPLANT
PADDING CAST COTTON 4X4 STRL (CAST SUPPLIES)
PENCIL SMOKE EVACUATOR (MISCELLANEOUS) IMPLANT
PROTECTOR NERVE ULNAR (MISCELLANEOUS) IMPLANT
SET HNDPC FAN SPRY TIP SCT (DISPOSABLE) IMPLANT
SOL PREP PROV IODINE SCRUB 4OZ (MISCELLANEOUS) IMPLANT
SYR CONTROL 10ML LL (SYRINGE) IMPLANT
TOWEL OR 17X26 10 PK STRL BLUE (TOWEL DISPOSABLE) IMPLANT

## 2021-03-11 NOTE — Anesthesia Postprocedure Evaluation (Signed)
Anesthesia Post Note  Patient: Harry [name]  Procedure(s) Performed: Procedure canceled by anesthesia decision before procedure began. (Right)     Patient location during evaluation: PACU Anesthesia Type: General Level of consciousness: awake Pain management: pain level controlled Respiratory status: spontaneous breathing Cardiovascular status: stable Anesthetic complications: no Comments: Cardiology consult done.    No notable events documented.  Last Vitals:  Vitals:   03/11/21 1730 03/11/21 1745  BP: 99/74   Pulse: 89 84  Resp: 19 (!) 26  Temp:    SpO2: 98% 93%    Last Pain:  Vitals:   03/11/21 1554  TempSrc:   PainSc: 0-No pain                 Mavery Milling

## 2021-03-11 NOTE — Progress Notes (Signed)
Upon induction of anesthesia, patient developed hypotension and afib with RVR. Anesthesiology cancelled the surgery. We will obtain cardiology consult in the PACU. Plan for surgery when medically ready.

## 2021-03-11 NOTE — H&P (Signed)
PREOPERATIVE H&P  Chief Complaint: Right knee wound necrosis  HPI: Harry [name] is a 70 y.o. male who underwent primary right total knee arthroplasty on 01/07/2021.  He suffered an unfortunate incident postoperatively, while he was doing stair training with physical therapy and he felt a pop in the knee.  This was most likely the instance when he suffered injury to the extensor mechanism.  He then attempted to walk off of the stairs, and the knee dislocated.  He was brought to the emergency department at that time, and radiographs revealed patella alta.  He was then taken to the operating room for revision total knee arthroplasty by myself and reconstruction of the extensor mechanism and MCL by Dr. Griffin Basil.  At his immediate postoperative visit, he was found to have some questional skin flap viability.  He was treated for a total of 6 weeks in a cylinder cast and had wound checks at regular intervals.  At his wound check yesterday, the wound was found to have fully demarcated.  He was therefore indicated for debridement.  He will need plastic surgery consultation for closure.  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   Dry mouth    GERD (gastroesophageal reflux disease)    Hypertension    taking blood pressure medication as needed   Macular degeneration    Bilateral   PONV (postoperative nausea and vomiting)    Tendonitis    Wound healing, delayed    bottom and right heel currently being treated   Past Surgical History:  Procedure Laterality Date   KNEE ARTHROPLASTY Right 01/07/2021   Procedure: COMPUTER ASSISTED TOTAL KNEE ARTHROPLASTY;  Surgeon: Rod Can, MD;  Location: [facility];  Service: Orthopedics;  Laterality: Right;   KNEE CLOSED REDUCTION Right 01/20/2021   Procedure: CLOSED MANIPULATION KNEE;  Surgeon: Nicholes Stairs, MD;  Location: [facility];  Service: Orthopedics;  Laterality: Right;   KNEE SURGERY Right    NOSE  SURGERY     ORIF TIBIA PLATEAU Right 01/21/2021   Procedure: PATELLA TENDON REPAIR;  Surgeon: Hiram Gash, MD;  Location: [facility];  Service: Orthopedics;  Laterality: Right;   TOTAL KNEE ARTHROPLASTY Right 01/21/2021   Procedure: TOTAL KNEE REVISION BOTH COMPONENTS;  Surgeon: Rod Can, MD;  Location: [facility];  Service: Orthopedics;  Laterality: Right;   WISDOM TOOTH EXTRACTION     Social History   Socioeconomic History   Marital status: Divorced    Spouse name: Not on file   Number of children: Not on file   Years of education: Not on file   Highest education level: Not on file  Occupational History   Not on file  Tobacco Use   Smoking status: Former    Years: 20.00    Types: Cigarettes    Quit date: 2010    Years since quitting: 12.7   Smokeless tobacco: Never  Vaping Use   Vaping Use: Never used  Substance and Sexual Activity   Alcohol use: Yes    Comment: cocktail after dinner- none in 3 months 01/19/21   Drug use: Not Currently   Sexual activity: Not on file  Other Topics Concern   Not on file  Social History Narrative   Not on file   Social Determinants of Health   Financial Resource Strain: Not on file  Food Insecurity: Not on file  Transportation Needs: Not on file  Physical Activity: Not on  file  Stress: Not on file  Social Connections: Not on file   History reviewed. No pertinent family history. No Known Allergies Prior to Admission medications   Medication Sig Start Date End Date Taking? Authorizing Provider  acetaminophen (TYLENOL) 500 MG tablet Take 1,000 mg by mouth every 6 (six) hours as needed (pain).   Yes [provider]  aspirin EC 81 MG tablet Take 81 mg by mouth in the morning. Swallow whole.   Yes [provider]  atorvastatin (LIPITOR) 20 MG tablet Take 20 mg by mouth daily in the afternoon.   Yes [provider]  Carboxymethylcellul-Glycerin (LUBRICATING EYE DROPS OP) Place 1 drop into both eyes 2 (two) times  daily.   Yes [provider]  docusate sodium (COLACE) 100 MG capsule Take 1 capsule (100 mg total) by mouth 2 (two) times daily. Patient taking differently: Take 100 mg by mouth 2 (two) times daily as needed (constipation). 01/08/21  Yes Donni Oglesby, Aaron Edelman, MD  doxycycline (VIBRAMYCIN) 100 MG capsule Take 100 mg by mouth 2 (two) times daily. 02/24/21  Yes [provider]  lisinopril (ZESTRIL) 20 MG tablet Take 20 mg by mouth daily as needed (elevated blood pressure). 03/02/21  Yes [provider]  Multiple Vitamin (MULTIVITAMIN WITH MINERALS) TABS Take 1 tablet by mouth in the morning. Men Adult   Yes [provider]  Multiple Vitamins-Minerals (PRESERVISION AREDS 2) CAPS Take 1 capsule by mouth 2 (two) times daily.   Yes [provider]  omeprazole (PRILOSEC) 20 MG capsule Take 20 mg by mouth daily in the afternoon. 11/24/20  Yes [provider]  enoxaparin (LOVENOX) 30 MG/0.3ML injection Inject 0.3 mLs (30 mg total) into the skin every 12 (twelve) hours. Patient not taking: Reported on 03/10/2021 01/27/21 02/26/21  Cherlynn June B, PA  HYDROcodone-acetaminophen (NORCO) 7.5-325 MG tablet Take 1-2 tablets by mouth every 4 (four) hours as needed for severe pain (pain score 7-10). Patient not taking: Reported on 03/10/2021 01/23/21   Cherlynn June B, PA  ondansetron (ZOFRAN) 4 MG tablet Take 1 tablet (4 mg total) by mouth every 6 (six) hours as needed for nausea. 01/08/21   Lavaun Greenfield, Aaron Edelman, MD  oxyCODONE (OXY IR/ROXICODONE) 5 MG immediate release tablet Take 1 tablet (5 mg total) by mouth every 4 (four) hours as needed for moderate pain (pain score 4-6). Patient not taking: Reported on 03/10/2021 01/08/21   Rod Can, MD  senna (SENOKOT) 8.6 MG TABS tablet Take 2 tablets (17.2 mg total) by mouth at bedtime. Patient not taking: Reported on 03/10/2021 01/08/21   Rod Can, MD     Positive ROS: All other systems have been reviewed and were otherwise  negative with the exception of those mentioned in the HPI and as above.  Physical Exam: General: Alert, no acute distress Cardiovascular: No pedal edema Respiratory: No cyanosis, no use of accessory musculature GI: No organomegaly, abdomen is soft and non-tender Skin: No lesions in the area of chief complaint Neurologic: Sensation intact distally Psychiatric: Patient is competent for consent with normal mood and affect Lymphatic: No axillary or cervical lymphadenopathy  MUSCULOSKELETAL: Examination of the right knee reveals that the superior and inferior parts of the incision have healed.  In the center of the incision he has a area of the medial skin flap the proceeds through the center of the incision to the area of skin lateral to the incision with necrotic skin.  He can perform a straight leg raise without any lag.  He has  mild pedal edema.  No calf tenderness to palpation.  He has palpable pedal pulses.  Assessment: Right knee wound necrosis  Plan: Plan for Procedure(s): IRRIGATION AND DEBRIDEMENT RIGHT KNEE  The risks benefits and alternatives were discussed with the patient including but not limited to the risks of nonoperative treatment, versus surgical intervention including infection, bleeding, nerve injury,  blood clots, cardiopulmonary complications, morbidity, mortality, among others, and they were willing to proceed.   Bertram Savin, MD [phone]   03/11/2021 2:10 PM

## 2021-03-11 NOTE — OR Nursing (Signed)
Procedure canceled by anesthesia due to patient cardiac issues after intubation prior to procedure start. Patient had not been prepped or draped prior to cancellation. Patient woken up by anesthesia and transported to PACU by CRNA and circulator.

## 2021-03-11 NOTE — Anesthesia Procedure Notes (Signed)
Procedure Name: Intubation Date/Time: 03/11/2021 3:18 PM Performed by: Eben Burow, CRNA Pre-anesthesia Checklist: Patient identified, Emergency Drugs available, Suction available, Patient being monitored and Timeout performed Patient Re-evaluated:Patient Re-evaluated prior to induction Oxygen Delivery Method: Circle system utilized Preoxygenation: Pre-oxygenation with 100% oxygen Induction Type: IV induction Ventilation: Mask ventilation without difficulty Laryngoscope Size: Mac and 4 Grade View: Grade I Tube type: Oral Tube size: 7.5 mm Number of attempts: 1 Airway Equipment and Method: Stylet Placement Confirmation: ETT inserted through vocal cords under direct vision, positive ETCO2 and breath sounds checked- equal and bilateral Secured at: 23 cm Tube secured with: Tape Dental Injury: Teeth and Oropharynx as per pre-operative assessment

## 2021-03-11 NOTE — Transfer of Care (Signed)
Immediate Anesthesia Transfer of Care Note  Patient: Harry [name]  Procedure(s) Performed: Procedure canceled by anesthesia decision before procedure began. (Right)  Patient Location: PACU  Anesthesia Type:General  Level of Consciousness: awake, alert  and patient cooperative  Airway & Oxygen Therapy: Patient Spontanous Breathing and Patient connected to face mask oxygen  Post-op Assessment: Report given to RN and Post -op Vital signs reviewed and stable  Post vital signs: Reviewed and stable  Last Vitals:  Vitals Value Taken Time  BP    Temp    Pulse 89 03/11/21 1551  Resp 19 03/11/21 1552  SpO2 100 % 03/11/21 1551  Vitals shown include unvalidated device data.  Last Pain:  Vitals:   03/11/21 1219  TempSrc: Oral         Complications: No notable events documented.

## 2021-03-11 NOTE — Consult Note (Signed)
NAME:  JORDI LACKO, MRN:  [id number], DOB:  06-25-1950, LOS: 0 ADMISSION DATE:  03/11/2021, CONSULTATION DATE:  03/11/2021 REFERRING MD:  Lyla Glassing, CHIEF COMPLAINT:  Hypotension and Afib w RVR   History of Present Illness:  G.L. is a 70 yo male s/p primary right total knee arthroplasty on 01/07/21. Sustained injury to the extensor mechanism during rehab. 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 On 03/10/21 at an in-office check up, R knee surgical wound was found to have fully demarcated and requires debridement, planned for today, 03/11/21.  Pt intubated for procedure, prior to beginning procedure, anesthesia noted he was hypotensive, in Afib RVR, and surgery was cancelled. PCCM consult. Cardiology consult. Pt in PACU, extubated.  Pertinent  Medical History  Cancer HTN Arthritis Delayed wound healing  Significant Hospital Events: Including procedures, antibiotic start and stop dates in addition to other pertinent events   9/21: planned wound debridement in OR, intubated, hypotension, Afib RVR, surgery cancelled. PCCM consult. Cardiology consult. Extubated  Interim History / Subjective:  Pt. Waking up from the affects of anesthesia.  Objective   Blood pressure 132/81, pulse 95, temperature 99.4 F (37.4 C), temperature source Oral, height 6\' 4"  (1.93 m), weight 129.3 kg, SpO2 96 %.        Intake/Output Summary (Last 24 hours) at 03/11/2021 1557 Last data filed at 03/11/2021 1553 Gross per 24 hour  Intake 1562 ml  Output no documentation  Net 1562 ml   Filed Weights   03/10/21 1510  Weight: 129.3 kg    Examination: General: pt laying on stretcher, in no distress. HENT: WDL Lungs: clear, no RRR Cardiovascular: No JVD, S1, S2. Bigeminy PVC. Abdomen: soft, non-tender, active bowel sounds. Extremities: R leg in ace bandage and ortho brace. Neuro:  A&Ox2-3. Waking up from anesthesia. GU: foley in place.   Resolved Hospital Problem list     Assessment & Plan:  Remote Hx Skin Cancer HTN R knee total arthroplasty w revision (12/2020, 01/2021) Delayed wound healing Arthritis   Hypotension and Afib RVR s/p induction of anesthesia Pt VSS: HR 90's, irregular w/ PVCs. BP: 110/80 (93).  On no vasoactive medications. Extubated, on simple mask. SpO2: 98%. Unclear if this was a primary cardiac event or secondary to wound requiring debridement.  Plan: -Cardiology to consult -Continue to plan knee debridement when stable to remove a potential source of aggravation  No role for PCCM at this time. We are able to be re-consulted if indicated.    Best Practice (right click and "Reselect all SmartList Selections" daily)   Diet/type: NPO DVT prophylaxis: not indicated GI prophylaxis: N/A Lines: N/A Foley:  Yes, and it is still needed Code Status:  full code Last date of multidisciplinary goals of care discussion [03/11/21]  Labs   CBC: Recent Labs  Lab 03/11/21 1215  WBC 9.6  HGB 10.7*  HCT 35.1*  MCV 90.9  PLT 409    Basic Metabolic Panel: Recent Labs  Lab 03/11/21 1215  NA 137  K 3.8  CL 105  CO2 22  GLUCOSE 104*  BUN 13  CREATININE 0.90  CALCIUM 8.9   GFR: Estimated Creatinine Clearance: 113.7 mL/min (by C-G formula based on SCr of 0.9 mg/dL). Recent Labs  Lab 03/11/21 1215  WBC 9.6    Liver Function Tests: Recent Labs  Lab 03/11/21 1215  AST 21  ALT 18  ALKPHOS 138*  BILITOT 0.9  PROT 7.2  ALBUMIN 3.7   No results for input(s): LIPASE, AMYLASE in the last 168 hours. No results for input(s): AMMONIA in the last 168 hours.  ABG    Component Value Date/Time   TCO2 23 01/21/2021 1517     Coagulation Profile: Recent Labs  Lab 03/11/21 1215  INR 1.2    Cardiac Enzymes: No results for input(s): CKTOTAL, CKMB, CKMBINDEX, TROPONINI in the last 168 hours.  HbA1C: No results found for:  HGBA1C  CBG: No results for input(s): GLUCAP in the last 168 hours.  Review of Systems:     Past Medical History:  He,  has a past medical history of Arthritis, Cancer ([place]), Dry mouth, GERD (gastroesophageal reflux disease), Hypertension, Macular degeneration, PONV (postoperative nausea and vomiting), Tendonitis, and Wound healing, delayed.   Surgical History:   Past Surgical History:  Procedure Laterality Date   KNEE ARTHROPLASTY Right 01/07/2021   Procedure: COMPUTER ASSISTED TOTAL KNEE ARTHROPLASTY;  Surgeon: Rod Can, MD;  Location: [facility];  Service: Orthopedics;  Laterality: Right;   KNEE CLOSED REDUCTION Right 01/20/2021   Procedure: CLOSED MANIPULATION KNEE;  Surgeon: Nicholes Stairs, MD;  Location: [facility];  Service: Orthopedics;  Laterality: Right;   KNEE SURGERY Right    NOSE SURGERY     ORIF TIBIA PLATEAU Right 01/21/2021   Procedure: PATELLA TENDON REPAIR;  Surgeon: Hiram Gash, MD;  Location: [facility];  Service: Orthopedics;  Laterality: Right;   TOTAL KNEE ARTHROPLASTY Right 01/21/2021   Procedure: TOTAL KNEE REVISION BOTH COMPONENTS;  Surgeon: Rod Can, MD;  Location: [facility];  Service: Orthopedics;  Laterality: Right;   WISDOM TOOTH EXTRACTION       Social History:   reports that he quit smoking about 12 years ago. His smoking use included cigarettes. He has never used smokeless tobacco. He reports current alcohol use. He reports that he does not currently use drugs.   Family History:  His family history is not on file.   Allergies No Known Allergies   Home Medications  Prior to Admission medications   Medication Sig Start Date End Date Taking? Authorizing Provider  acetaminophen (TYLENOL) 500 MG tablet Take 1,000 mg by mouth every 6 (six) hours as needed (pain).   Yes [provider]  aspirin EC 81 MG tablet Take 81 mg by mouth in the morning. Swallow whole.   Yes [provider]  atorvastatin (LIPITOR) 20 MG tablet Take  20 mg by mouth daily in the afternoon.   Yes [provider]  Carboxymethylcellul-Glycerin (LUBRICATING EYE DROPS OP) Place 1 drop into both eyes 2 (two) times daily.   Yes [provider]  docusate sodium (COLACE) 100 MG capsule Take 1 capsule (100 mg total) by mouth 2 (two) times daily. Patient taking differently: Take 100 mg by mouth 2 (two) times daily as needed (constipation). 01/08/21  Yes Swinteck, Aaron Edelman, MD  doxycycline (VIBRAMYCIN) 100 MG capsule Take 100 mg by mouth 2 (two) times daily. 02/24/21  Yes [provider]  lisinopril (ZESTRIL) 20 MG tablet Take 20 mg by mouth daily as needed (elevated blood pressure). 03/02/21  Yes [provider]  Multiple Vitamin (MULTIVITAMIN WITH MINERALS) TABS Take 1 tablet by mouth in the morning. Men Adult   Yes [provider]  Multiple Vitamins-Minerals (PRESERVISION AREDS 2) CAPS Take 1 capsule by mouth 2 (two) times daily.   Yes [provider]  omeprazole (PRILOSEC) 20 MG capsule Take 20 mg  by mouth daily in the afternoon. 11/24/20  Yes [provider]  enoxaparin (LOVENOX) 30 MG/0.3ML injection Inject 0.3 mLs (30 mg total) into the skin every 12 (twelve) hours. Patient not taking: Reported on 03/10/2021 01/27/21 02/26/21  Cherlynn June B, PA  HYDROcodone-acetaminophen (NORCO) 7.5-325 MG tablet Take 1-2 tablets by mouth every 4 (four) hours as needed for severe pain (pain score 7-10). Patient not taking: Reported on 03/10/2021 01/23/21   Cherlynn June B, PA  ondansetron (ZOFRAN) 4 MG tablet Take 1 tablet (4 mg total) by mouth every 6 (six) hours as needed for nausea. 01/08/21   Swinteck, Aaron Edelman, MD  oxyCODONE (OXY IR/ROXICODONE) 5 MG immediate release tablet Take 1 tablet (5 mg total) by mouth every 4 (four) hours as needed for moderate pain (pain score 4-6). Patient not taking: Reported on 03/10/2021 01/08/21   Rod Can, MD  senna (SENOKOT) 8.6 MG TABS tablet Take 2 tablets (17.2 mg total) by  mouth at bedtime. Patient not taking: Reported on 03/10/2021 01/08/21   Rod Can, MD         Angelyn Punt, NP Student

## 2021-03-11 NOTE — Consult Note (Signed)
Cardiology Consultation:   Patient ID: [name] MRN: [id number]; DOB: [birth year]  Admit date: 03/11/2021 Date of Consult: 03/11/2021  PCP:  Buzzy Han, MD   [facility] Providers Cardiologist:  None        Patient Profile:   Harry [name] is a 70 y.o. male with a hx of hypertension, basal cell carcinoma who is being seen 03/11/2021 for the evaluation of atrial fibrillation at the request of Dr. Lyla Glassing.  History of Present Illness:   Harry [name] underwent right total knee arthroplasty on 01/07/2021.  Subsequently dislocated his knee doing physical therapy.  Was taken back to the OR for revision of TKA.  At his follow-up from this, found to have necrosis of right knee wound.  Plan was to take to the OR today for debridement.  Spoke with Dr. Nyoka Cowden in anesthesia and while he was being transferred to the OR table, had tachycardia to 140s (unclear what rhythm was).  Subsequently was in ventricular bigeminy.  When anesthesia was induced, became hypotensive and procedure was aborted.  Telemetry shows that since arrival in PACU he has been in normal sinus rhythm with frequent PVCs, rate 70s to 80s.  EKG showed normal sinus rhythm, rate 87, PVCs, left axis deviation, inferior Q waves.  He is currently alert and oriented.  BP 138/82, SPO2 95%.  He denies any chest pain, palpitations, lightheadedness, or syncope.  Does report intermittent shortness of breath, which he attributes to his COPD.   Past Medical History:  Diagnosis Date   Arthritis    Cancer ([employer])    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   Dry mouth    GERD (gastroesophageal reflux disease)    Hypertension    taking blood pressure medication as needed   Macular degeneration    Bilateral   PONV (postoperative nausea and vomiting)    Tendonitis    Wound healing, delayed    bottom and right heel currently being treated    Past Surgical History:  Procedure Laterality Date   KNEE ARTHROPLASTY Right 01/07/2021    Procedure: COMPUTER ASSISTED TOTAL KNEE ARTHROPLASTY;  Surgeon: Rod Can, MD;  Location: [facility];  Service: Orthopedics;  Laterality: Right;   KNEE CLOSED REDUCTION Right 01/20/2021   Procedure: CLOSED MANIPULATION KNEE;  Surgeon: Nicholes Stairs, MD;  Location: [facility];  Service: Orthopedics;  Laterality: Right;   KNEE SURGERY Right    NOSE SURGERY     ORIF TIBIA PLATEAU Right 01/21/2021   Procedure: PATELLA TENDON REPAIR;  Surgeon: Hiram Gash, MD;  Location: [facility];  Service: Orthopedics;  Laterality: Right;   TOTAL KNEE ARTHROPLASTY Right 01/21/2021   Procedure: TOTAL KNEE REVISION BOTH COMPONENTS;  Surgeon: Rod Can, MD;  Location: [facility];  Service: Orthopedics;  Laterality: Right;   WISDOM TOOTH EXTRACTION         Inpatient Medications: Scheduled Meds:  Continuous Infusions:  sodium chloride     acetaminophen      ceFAZolin (ANCEF) IV     lactated ringers 10 mL/hr at 03/11/21 1501   tranexamic acid     PRN Meds: fentaNYL (SUBLIMAZE) injection  Allergies:   No Known Allergies  Social History:   Social History   Socioeconomic History   Marital status: Divorced    Spouse name: Not on file   Number of children: Not on file   Years of education: Not on file   Highest education level: Not on file  Occupational History   Not on file  Tobacco  Use   Smoking status: Former    Years: 20.00    Types: Cigarettes    Quit date: 2010    Years since quitting: 12.7   Smokeless tobacco: Never  Vaping Use   Vaping Use: Never used  Substance and Sexual Activity   Alcohol use: Yes    Comment: cocktail after dinner- none in 3 months 01/19/21   Drug use: Not Currently   Sexual activity: Not on file  Other Topics Concern   Not on file  Social History Narrative   Not on file   Social Determinants of Health   Financial Resource Strain: Not on file  Food Insecurity: Not on file  Transportation Needs: Not on file  Physical Activity: Not on file  Stress: Not  on file  Social Connections: Not on file  Intimate Partner Violence: Not on file    Family History:   History reviewed. No pertinent family history.   ROS:  Please see the history of present illness.   All other ROS reviewed and negative.     Physical Exam/Data:   Vitals:   03/10/21 1510 03/11/21 1219  BP:  132/81  Pulse:  95  Temp:  99.4 F (37.4 C)  TempSrc:  Oral  SpO2:  96%  Weight: 129.3 kg   Height: 6\' 4"  (1.93 m)     Intake/Output Summary (Last 24 hours) at 03/11/2021 1559 Last data filed at 03/11/2021 1553 Gross per 24 hour  Intake 1562 ml  Output --  Net 1562 ml   Last 3 Weights 03/10/2021 01/26/2021 01/22/2021  Weight (lbs) 285 lb 249 lb 12.8 oz 337 lb 15.4 oz  Weight (kg) 129.275 kg 113.309 kg 153.3 kg     Body mass index is 34.69 kg/m.  General:  in no acute distress HEENT: normal Neck: no JVD appreciated but difficult to assess given habitus Cardiac:  RRR; no murmur; distant heart sounds Lungs:  clear to auscultation bilaterally, no wheezing, rhonchi or rales  Abd: soft, nontender Ext: Mild right lower extremity edema Musculoskeletal:  No deformities Skin: warm and dry  Neuro:  no focal abnormalities noted Psych:  Normal affect   EKG:  The EKG was personally reviewed and demonstrates:   EKG showed normal sinus rhythm, rate 87, PVCs, left axis deviation, inferior Q waves. Telemetry:  Telemetry was personally reviewed and demonstrates:  Telemetry shows that since arrival in PACU he has been in normal sinus rhythm with frequent PVCs, rate 70s to 80s.  Relevant CV Studies:   Laboratory Data:  High Sensitivity Troponin:  No results for input(s): TROPONINIHS in the last 720 hours.   Chemistry Recent Labs  Lab 03/11/21 1215  NA 137  K 3.8  CL 105  CO2 22  GLUCOSE 104*  BUN 13  CREATININE 0.90  CALCIUM 8.9  GFRNONAA >60  ANIONGAP 10    Recent Labs  Lab 03/11/21 1215  PROT 7.2  ALBUMIN 3.7  AST 21  ALT 18  ALKPHOS 138*  BILITOT 0.9    Lipids No results for input(s): CHOL, TRIG, HDL, LABVLDL, LDLCALC, CHOLHDL in the last 168 hours.  Hematology Recent Labs  Lab 03/11/21 1215  WBC 9.6  RBC 3.86*  HGB 10.7*  HCT 35.1*  MCV 90.9  MCH 27.7  MCHC 30.5  RDW 16.2*  PLT 319   Thyroid No results for input(s): TSH, FREET4 in the last 168 hours.  BNPNo results for input(s): BNP, PROBNP in the last 168 hours.  DDimer No results for input(s):  DDIMER in the last 168 hours.   Radiology/Studies:  No results found.   Assessment and Plan:   Tachycardia/frequent PVCs: Had tachycardia to 140s when being transferred to the OR table today.  D/w anesthesia, unclear rhythm.  No rhythm strips available at this time.  Subsequently was in ventricular bigeminy and developed hypotension and tachycardia again following induction of anesthesia.  Procedure (debridement of knee wound) was canceled.  Since arrival in PACU, he is normotensive.  Telemetry shows sinus rhythm with frequent PVCs, including intermittent ventricular bigeminy. -Planning admit to hospitalist for further evaluation -Monitor on telemetry -Check echocardiogram -Maintain K>4, Mag>2   For questions or updates, please contact [place] Please consult [email or web address] for contact info under    Signed, Donato Heinz, MD  03/11/2021 3:59 PM

## 2021-03-11 NOTE — H&P (Signed)
[facility] History and Physical  Harry [name] WUJ:[id number] DOB: August 30, 1950 DOA: 03/11/2021 PCP: Buzzy Han, MD  Admitted from: Home Chief Complaint: A. fib with RVR after anesthesia  History of Present Illness: Harry [name] is a 70 y.o. male with PMH significant for HTN, COPD, chronic bilateral lower extremity lymphedema who underwent right knee arthroplasty on 01/07/2021 and was recuperating in the rehab.   On 8/1, he suffered an injury to the extensor mechanism during the rehab.  He while attempting to walk off of the stairs, he dislocated his knee and was brought to the ED.  At that time, x-ray showed anterior knee hardware dislocation.  It was successfully reduced.  Next day on 8/2, repeat x-ray showed recurrent dislocation of right knee arthroplasty.  It was successfully reduced again but patient had persistent patella alta. On 8/3 orthopedic surgeon Dr. Lyla Glassing to him to the OR for revision of total knee arthroplasty and reconstruction of the extensor mechanism and MCL. At his immediate postop visit, he was found to have some questionable skin flap viability.  He was treated for a total of 6 weeks in a cylinder cast and had wound checks at regular intervals. He was discharged from rehab to home 6 days ago.  On follow-up on 9/20, his wound was found to be fully demarcated and was therefore indicated for debridement. He was to the hospital today for elective irrigation and debridement of right knee. Patient was intubated for the procedure.  However upon induction of anesthesia, before the procedure had started, patient developed hypotension down to 60s in A. fib with RVR as well.  Anesthesia canceled the surgery. Critical care and cardiology consultation called.  By the time patient was seen by critical care, patient's vital signs have improved with heart rate in 90s, blood pressure to 110/80.  Patient was not requiring any as active medication.  He did not  require ICU admission and was recommended admission under hospitalist service.  At the time of my evaluation in PACU, patient was alert, awake, oriented x3. Vital signs stable. Patient denies any cardiac symptoms like chest pain, palpitation lightheadedness or syncope. Telemetry in PACU showed normal sinus rhythm with frequent PVCs at the rate of 70s and 80s. EKG showed normal sinus rhythm with PVCs and left axis deviation with inferior Q waves. Labs unremarkable including potassium of 4, magnesium level pending.  Patient states he stopped smoking 14 years ago.  He does not have a diagnosis of sleep apnea.  He has chronic bilateral lower extremity lymphedema and is not on any diuretics at home.    Review of Systems:  All systems were reviewed and were negative unless otherwise mentioned in the HPI   Past medical history: Past Medical History:  Diagnosis Date   Arthritis    Cancer ([place])    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   Dry mouth    GERD (gastroesophageal reflux disease)    Hypertension    taking blood pressure medication as needed   Macular degeneration    Bilateral   PONV (postoperative nausea and vomiting)    Tendonitis    Wound healing, delayed    bottom and right heel currently being treated    Past surgical history: Past Surgical History:  Procedure Laterality Date   KNEE ARTHROPLASTY Right 01/07/2021   Procedure: COMPUTER ASSISTED TOTAL KNEE ARTHROPLASTY;  Surgeon: Rod Can, MD;  Location: [facility];  Service: Orthopedics;  Laterality: Right;   KNEE CLOSED REDUCTION Right 01/20/2021   Procedure: CLOSED  MANIPULATION KNEE;  Surgeon: Nicholes Stairs, MD;  Location: [facility];  Service: Orthopedics;  Laterality: Right;   KNEE SURGERY Right    NOSE SURGERY     ORIF TIBIA PLATEAU Right 01/21/2021   Procedure: PATELLA TENDON REPAIR;  Surgeon: Hiram Gash, MD;  Location: [facility];  Service: Orthopedics;  Laterality: Right;   TOTAL KNEE ARTHROPLASTY Right 01/21/2021    Procedure: TOTAL KNEE REVISION BOTH COMPONENTS;  Surgeon: Rod Can, MD;  Location: [facility];  Service: Orthopedics;  Laterality: Right;   WISDOM TOOTH EXTRACTION      Social History:  reports that he quit smoking about 12 years ago. His smoking use included cigarettes. He has never used smokeless tobacco. He reports current alcohol use. He reports that he does not currently use drugs.  Allergies:  No Known Allergies  Family history:  History reviewed. No pertinent family history.   Home Meds: Prior to Admission medications   Medication Sig Start Date End Date Taking? Authorizing Provider  acetaminophen (TYLENOL) 500 MG tablet Take 1,000 mg by mouth every 6 (six) hours as needed (pain).   Yes [provider]  aspirin EC 81 MG tablet Take 81 mg by mouth in the morning. Swallow whole.   Yes [provider]  atorvastatin (LIPITOR) 20 MG tablet Take 20 mg by mouth daily in the afternoon.   Yes [provider]  Carboxymethylcellul-Glycerin (LUBRICATING EYE DROPS OP) Place 1 drop into both eyes 2 (two) times daily.   Yes [provider]  docusate sodium (COLACE) 100 MG capsule Take 1 capsule (100 mg total) by mouth 2 (two) times daily. Patient taking differently: Take 100 mg by mouth 2 (two) times daily as needed (constipation). 01/08/21  Yes Swinteck, Aaron Edelman, MD  doxycycline (VIBRAMYCIN) 100 MG capsule Take 100 mg by mouth 2 (two) times daily. 02/24/21  Yes [provider]  lisinopril (ZESTRIL) 20 MG tablet Take 20 mg by mouth daily as needed (elevated blood pressure). 03/02/21  Yes [provider]  Multiple Vitamin (MULTIVITAMIN WITH MINERALS) TABS Take 1 tablet by mouth in the morning. Men Adult   Yes [provider]  Multiple Vitamins-Minerals (PRESERVISION AREDS 2) CAPS Take 1 capsule by mouth 2 (two) times daily.   Yes [provider]  omeprazole (PRILOSEC) 20 MG capsule Take 20 mg by mouth daily in the afternoon. 11/24/20   Yes [provider]  enoxaparin (LOVENOX) 30 MG/0.3ML injection Inject 0.3 mLs (30 mg total) into the skin every 12 (twelve) hours. Patient not taking: Reported on 03/10/2021 01/27/21 02/26/21  Cherlynn June B, PA  HYDROcodone-acetaminophen (NORCO) 7.5-325 MG tablet Take 1-2 tablets by mouth every 4 (four) hours as needed for severe pain (pain score 7-10). Patient not taking: Reported on 03/10/2021 01/23/21   Cherlynn June B, PA  ondansetron (ZOFRAN) 4 MG tablet Take 1 tablet (4 mg total) by mouth every 6 (six) hours as needed for nausea. 01/08/21   Swinteck, Aaron Edelman, MD  oxyCODONE (OXY IR/ROXICODONE) 5 MG immediate release tablet Take 1 tablet (5 mg total) by mouth every 4 (four) hours as needed for moderate pain (pain score 4-6). Patient not taking: Reported on 03/10/2021 01/08/21   Rod Can, MD  senna (SENOKOT) 8.6 MG TABS tablet Take 2 tablets (17.2 mg total) by mouth at bedtime. Patient not taking: Reported on 03/10/2021 01/08/21   Rod Can, MD    Physical Exam: Vitals:   03/11/21 1700 03/11/21 1715 03/11/21 1730 03/11/21 1745  BP: 94/71 105/62 99/74  Pulse: 87 85 89 84  Resp: 18 20 19  (!) 26  Temp:      TempSrc:      SpO2: 97% 98% 98% 93%  Weight:      Height:       Wt Readings from Last 3 Encounters:  03/10/21 129.3 kg  01/26/21 113.3 kg  01/07/21 (!) 146.9 kg   Body mass index is 34.69 kg/m.  General exam: Pleasant elderly Caucasian male Skin: No rashes, lesions or ulcers. HEENT: Atraumatic, normocephalic, no obvious bleeding Lungs: Clear to auscultation bilaterally CVS: Regular rate and rhythm, no murmur GI/Abd soft, nontender, nondistended, bowel sound present CNS: Alert, awake, oriented x3 Psychiatry: Mood appropriate Extremities: Pedal edema 1+ bilaterally.  Right knee in immobilizer.  No calf tenderness    Labs on Admission:   CBC: Recent Labs  Lab 03/11/21 1215 03/11/21 1629  WBC 9.6 8.6  HGB 10.7* 9.5*  HCT 35.1* 31.0*  MCV 90.9 90.6   PLT 319 314    Basic Metabolic Panel: Recent Labs  Lab 03/11/21 1215 03/11/21 1629  NA 137 137  K 3.8 4.0  CL 105 106  CO2 22 25  GLUCOSE 104* 96  BUN 13 12  CREATININE 0.90 0.82  CALCIUM 8.9 8.5*  MG  --  1.8    Liver Function Tests: Recent Labs  Lab 03/11/21 1215  AST 21  ALT 18  ALKPHOS 138*  BILITOT 0.9  PROT 7.2  ALBUMIN 3.7   No results for input(s): LIPASE, AMYLASE in the last 168 hours. No results for input(s): AMMONIA in the last 168 hours.  Cardiac Enzymes: No results for input(s): CKTOTAL, CKMB, CKMBINDEX, TROPONINI in the last 168 hours.  BNP (last 3 results) No results for input(s): BNP in the last 8760 hours.  ProBNP (last 3 results) No results for input(s): PROBNP in the last 8760 hours.  CBG: No results for input(s): GLUCAP in the last 168 hours.  Lipase  No results found for: LIPASE   Urinalysis    Component Value Date/Time   COLORURINE YELLOW 12/26/2020 0923   APPEARANCEUR HAZY (A) 12/26/2020 0923   LABSPEC 1.020 12/26/2020 0923   PHURINE 5.0 12/26/2020 0923   GLUCOSEU NEGATIVE 12/26/2020 0923   HGBUR NEGATIVE 12/26/2020 0923   BILIRUBINUR NEGATIVE 12/26/2020 0923   KETONESUR 5 (A) 12/26/2020 0923   PROTEINUR NEGATIVE 12/26/2020 0923   NITRITE NEGATIVE 12/26/2020 0923   LEUKOCYTESUR SMALL (A) 12/26/2020 0923     Drugs of Abuse  No results found for: LABOPIA, COCAINSCRNUR, LABBENZ, AMPHETMU, THCU, LABBARB    Radiological Exams on Admission: No results found.   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   -I reviewed the rhythm strips from the OR.  Patient was tachycardic with PVCs.  I  did not see evidence of A. fib.   -No history of heart disease.  Never had a stress test done. -We will keep him in observation in telemetry -Potassium level was 4.  Add on magnesium level.  Hypotension -Per report, he was hypotensive down to 60s after anesthesia induction. -He has history of hypertension and is on lisinopril as needed.  Currently blood pressure stable. -Continue to monitor.  Chronic bilateral lower extremity lymphedema -Denies a known history of CHF.  Not on any diuretics at home.  May have  right-sided heart failure due to COPD.  Pending echocardiogram. -May benefit from low-dose diuretics on the long run.  History of COPD -Stopped smoking 14 years ago.  Hyperlipidemia -Continue statin.  Keep aspirin on hold.  Mobility: PT eval postprocedure Code Status:   Code Status: Full Code full code DVT prophylaxis: Lovenox subcu Antimicrobials: None Fluid: None  Diet:  Diet Order             Diet Heart Room service appropriate? Yes; Fluid consistency: Thin  Diet effective now                    Consultants: Orthopedics, cardiology Family Communication: None at bedside    Dispo: The patient is from: Home              Anticipated d/c is to: Home              Anticipated d/c date is: 3 days  ------------------------------------------------------------------------------------- Severity of Illness: The appropriate patient status for this patient is OBSERVATION. Observation status is judged to be reasonable and necessary in order to provide the required intensity of service to ensure the patient's safety. The patient's presenting symptoms, physical exam findings, and initial radiographic and laboratory data in the context of their medical condition is felt to place them at decreased risk for further clinical deterioration. Furthermore, it is anticipated that the patient will be medically stable for discharge from the hospital within 2 midnights of admission. The  following factors support the patient status of observation.   " The patient's presenting symptoms include elective procedure. " The physical exam findings include tachycardia and hypotension postanesthesia induction " The initial radiographic and laboratory data are nonconclusive.   Signed, Terrilee Croak, MD [facility] 03/11/2021

## 2021-03-11 NOTE — Progress Notes (Signed)
Patient admitted to [facility] from PACU.  Cardiac Monitor # [id number] was placed on the patient.

## 2021-03-11 NOTE — Anesthesia Preprocedure Evaluation (Addendum)
Anesthesia Evaluation  Patient identified by MRN, date of birth, ID band Patient awake    History of Anesthesia Complications (+) PONV  Airway Mallampati: II  TM Distance: >3 FB     Dental   Pulmonary former smoker,    breath sounds clear to auscultation       Cardiovascular hypertension,  Rhythm:Regular Rate:Normal     Neuro/Psych    GI/Hepatic Neg liver ROS, GERD  ,  Endo/Other  negative endocrine ROS  Renal/GU negative Renal ROS     Musculoskeletal  (+) Arthritis ,   Abdominal   Peds  Hematology   Anesthesia Other Findings   Reproductive/Obstetrics                            Anesthesia Physical Anesthesia Plan  ASA: 3  Anesthesia Plan: General   Post-op Pain Management:    Induction: Intravenous  PONV Risk Score and Plan: 3 and Ondansetron, Dexamethasone and Midazolam  Airway Management Planned: Oral ETT  Additional Equipment:   Intra-op Plan:   Post-operative Plan: Extubation in OR  Informed Consent: I have reviewed the patients History and Physical, chart, labs and discussed the procedure including the risks, benefits and alternatives for the proposed anesthesia with the patient or authorized representative who has indicated his/her understanding and acceptance.     Dental advisory given  Plan Discussed with: CRNA and Anesthesiologist  Anesthesia Plan Comments:        Anesthesia Quick Evaluation

## 2021-03-12 ENCOUNTER — Observation Stay (HOSPITAL_COMMUNITY): Payer: Medicare Other

## 2021-03-12 ENCOUNTER — Encounter (HOSPITAL_COMMUNITY): Payer: Self-pay | Admitting: Orthopedic Surgery

## 2021-03-12 ENCOUNTER — Observation Stay (HOSPITAL_BASED_OUTPATIENT_CLINIC_OR_DEPARTMENT_OTHER): Payer: Medicare Other

## 2021-03-12 DIAGNOSIS — R609 Edema, unspecified: Secondary | ICD-10-CM | POA: Diagnosis not present

## 2021-03-12 DIAGNOSIS — R9431 Abnormal electrocardiogram [ECG] [EKG]: Secondary | ICD-10-CM

## 2021-03-12 DIAGNOSIS — I9789 Other postprocedural complications and disorders of the circulatory system, not elsewhere classified: Secondary | ICD-10-CM | POA: Diagnosis not present

## 2021-03-12 DIAGNOSIS — I4892 Unspecified atrial flutter: Secondary | ICD-10-CM | POA: Diagnosis not present

## 2021-03-12 DIAGNOSIS — L8990 Pressure ulcer of unspecified site, unspecified stage: Secondary | ICD-10-CM | POA: Diagnosis present

## 2021-03-12 DIAGNOSIS — I96 Gangrene, not elsewhere classified: Secondary | ICD-10-CM | POA: Diagnosis not present

## 2021-03-12 DIAGNOSIS — I5189 Other ill-defined heart diseases: Secondary | ICD-10-CM

## 2021-03-12 LAB — CBC
HCT: 29 % — ABNORMAL LOW (ref 39.0–52.0)
Hemoglobin: 8.9 g/dL — ABNORMAL LOW (ref 13.0–17.0)
MCH: 27.7 pg (ref 26.0–34.0)
MCHC: 30.7 g/dL (ref 30.0–36.0)
MCV: 90.3 fL (ref 80.0–100.0)
Platelets: 254 10*3/uL (ref 150–400)
RBC: 3.21 MIL/uL — ABNORMAL LOW (ref 4.22–5.81)
RDW: 16.1 % — ABNORMAL HIGH (ref 11.5–15.5)
WBC: 7.8 10*3/uL (ref 4.0–10.5)
nRBC: 0 % (ref 0.0–0.2)

## 2021-03-12 LAB — ECHOCARDIOGRAM COMPLETE
Area-P 1/2: 2.8 cm2
Calc EF: 54.1 %
S' Lateral: 3.9 cm
Single Plane A2C EF: 46.1 %
Single Plane A4C EF: 62.7 %

## 2021-03-12 LAB — BASIC METABOLIC PANEL
Anion gap: 5 (ref 5–15)
BUN: 12 mg/dL (ref 8–23)
CO2: 26 mmol/L (ref 22–32)
Calcium: 8.7 mg/dL — ABNORMAL LOW (ref 8.9–10.3)
Chloride: 109 mmol/L (ref 98–111)
Creatinine, Ser: 0.94 mg/dL (ref 0.61–1.24)
GFR, Estimated: >60 mL/min (ref >60)
Glucose, Bld: 97 mg/dL (ref 70–99)
Potassium: 4 mmol/L (ref 3.5–5.1)
Sodium: 140 mmol/L (ref 135–145)

## 2021-03-12 LAB — HIV ANTIBODY (ROUTINE TESTING W REFLEX): HIV Screen 4th Generation wRfx: NONREACTIVE

## 2021-03-12 LAB — TSH: TSH: 1.043 u[IU]/mL (ref 0.350–4.500)

## 2021-03-12 MED ORDER — ALBUTEROL SULFATE (2.5 MG/3ML) 0.083% IN NEBU
2.5000 mg | INHALATION_SOLUTION | Freq: Two times a day (BID) | RESPIRATORY_TRACT | Status: DC
  Administered 2021-03-12 – 2021-03-14 (×5): 2.5 mg via RESPIRATORY_TRACT
  Filled 2021-03-12 (×6): qty 3

## 2021-03-12 MED ORDER — PERFLUTREN LIPID MICROSPHERE
1.0000 mL | INTRAVENOUS | Status: AC | PRN
  Administered 2021-03-12: 2 mL via INTRAVENOUS
  Filled 2021-03-12: qty 10

## 2021-03-12 MED ORDER — METOPROLOL TARTRATE 25 MG PO TABS
12.5000 mg | ORAL_TABLET | Freq: Two times a day (BID) | ORAL | Status: DC
  Administered 2021-03-12: 12.5 mg via ORAL
  Filled 2021-03-12 (×4): qty 1

## 2021-03-12 MED ORDER — IOHEXOL 350 MG/ML SOLN
80.0000 mL | Freq: Once | INTRAVENOUS | Status: AC | PRN
  Administered 2021-03-12: 80 mL via INTRAVENOUS

## 2021-03-12 MED ORDER — MAGNESIUM SULFATE 2 GM/50ML IV SOLN
2.0000 g | Freq: Once | INTRAVENOUS | Status: AC
  Administered 2021-03-12: 2 g via INTRAVENOUS
  Filled 2021-03-12: qty 50

## 2021-03-12 NOTE — Progress Notes (Signed)
PROGRESS NOTE  Harry [name]  DOB: 1951/03/30  PCP: Buzzy Han, MD IRS:[id number]  DOA: 03/11/2021  LOS: 1 day  Hospital Day: 2  Chief complaint: Tachycardia and hypotension postanesthesia  Brief narrative: Harry [name] is a 70 y.o. male with PMH significant for HTN, COPD, past history of smoking, chronic bilateral lower extremity lymphedema who underwent right knee arthroplasty on 01/07/2021 and was recuperating in the rehab.   On 8/1, he suffered an injury to the extensor mechanism during the rehab.  He while attempting to walk off of the stairs, he dislocated his knee and was brought to the ED.  At that time, x-ray showed anterior knee hardware dislocation.  It was successfully reduced.  Next day on 8/2, repeat x-ray showed recurrent dislocation of right knee arthroplasty.  It was successfully reduced again but patient had persistent patella alta. On 8/3 orthopedic surgeon Dr. Lyla Glassing to him to the OR for revision of total knee arthroplasty and reconstruction of the extensor mechanism and MCL. At his immediate postop visit, he was found to have some questionable skin flap viability.  He was treated for a total of 6 weeks in a cylinder cast and had wound checks at regular intervals. He was discharged from rehab to home 6 days ago.   On follow-up on 9/20, his wound was found to be fully demarcated and was therefore indicated for debridement. He was to the hospital today for elective irrigation and debridement of right knee. Patient was intubated for the procedure.  However upon induction of anesthesia, before the procedure had started, patient developed hypotension down to 60s in A. fib with RVR as well.  Anesthesia canceled the surgery. Patient was in the critical care and cardiology. Admitted to hospital service. See below for details  Subjective: Patient was seen and examined this morning.  Pleasant elderly Caucasian male.  Lying on bed.  Not in distress.  No new  symptoms.  Slept good last night.  Assessment/Plan: Tachycardia and hypotension postanesthesia. -Per report, patient had tachycardia to 140s after anesthesia induction and before the procedure was started.  It lasted for few minutes and his heart rate and blood pressure improved by the time he was brought to the PACU.   -Rhythm strips obtained during the event were reviewed.  SVT versus a flutter.  Cardiology to review. -No history of heart disease.  Never had a stress test done. -We will keep him in observation in telemetry Recent Labs  Lab 03/11/21 1215 03/11/21 1629 03/12/21 0451  K 3.8 4.0 4.0  MG  --  1.8  --     Hypotension -Per report, he was hypotensive down to 60s after anesthesia induction. -He has history of hypertension and is on lisinopril as needed.  Currently blood pressure stable. -Continue to monitor.   Chronic bilateral lower extremity lymphedema -Denies a known history of CHF.  Not on any diuretics at home.  May have right-sided heart failure due to COPD.  Pending echocardiogram.  May also have underlying sleep apnea.  Will benefit from sleep study as an outpatient. -May benefit from low-dose diuretics on the long run.   History of COPD -Stopped smoking 14 years ago.   Hyperlipidemia -Continue statin.  Keep aspirin on hold. No results found for: CHOL, TRIG, HDL, CHOLHDL, VLDL, LDLCALC, LDLDIRECT, LABVLDL  Anemia -Patient had normal hemoglobin over 12 till the knee arthroplasty June 2022.  Since then his hemoglobin level has been running less than 9 mostly.  No evidence of active bleeding.  Hemoglobin 8.9 today.  Will obtain anemia panel. Recent Labs    01/23/21 0322 01/24/21 0334 03/11/21 1215 03/11/21 1629 03/12/21 0451  HGB 7.2* 7.8* 10.7* 9.5* 8.9*  MCV 94.2 92.0 90.9 90.6 90.3   Right knee arthroplasty, dislocation, patella alta -Orthopedics following.  Previously planned procedure for 9/21 has been postponed till cardiology clearance.  Mobility:  PT eval postprocedure Code Status:   Code Status: Full Code  Nutritional status: Body mass index is 34.69 kg/m.     Diet:  Diet Order             Diet Heart Room service appropriate? Yes; Fluid consistency: Thin  Diet effective now                  DVT prophylaxis: Lovenox subcu   Antimicrobials: None Fluid: Normal saline at 50 mill per hour Consultants: Cardiology, orthopedics Family Communication: None at bedside  Status is: Observation  Remains inpatient appropriate because: Noted plan from orthopedics to do the debridement after clearance by cardiology.  Patient will also get a skin flap done by plastic surgery during his hospitalization.  Dispo: The patient is from: Home              Anticipated d/c is to: Home versus rehab              Patient currently is not medically stable to d/c.   Difficult to place patient No     Infusions:   sodium chloride 50 mL/hr at 03/11/21 2120   acetaminophen     lactated ringers 10 mL/hr at 03/11/21 1501   tranexamic acid      Scheduled Meds:  albuterol  2.5 mg Nebulization BID   atorvastatin  20 mg Oral Q1500   docusate sodium  100 mg Oral BID   enoxaparin (LOVENOX) injection  60 mg Subcutaneous Q24H   pantoprazole  40 mg Oral Daily   senna  1 tablet Oral BID    Antimicrobials: Anti-infectives (From admission, onward)    Start     Dose/Rate Route Frequency Ordered Stop   03/11/21 1215  ceFAZolin (ANCEF) IVPB 2g/100 mL premix        2 g 200 mL/hr over 30 Minutes Intravenous On call to O.R. 03/11/21 1201 03/12/21 0559   03/11/21 1215  vancomycin (VANCOREADY) IVPB 1500 mg/300 mL        1,500 mg 150 mL/hr over 120 Minutes Intravenous On call to O.R. 03/11/21 1201 03/11/21 1535       PRN meds: acetaminophen **OR** acetaminophen, hydrALAZINE, oxyCODONE, perflutren lipid microspheres (DEFINITY) IV suspension   Objective: Vitals:   03/12/21 0509 03/12/21 0902  BP: 110/74 (!) 107/52  Pulse: 85 88  Resp: 20 18   Temp: 97.8 F (36.6 C) 98.6 F (37 C)  SpO2: 100% 98%    Intake/Output Summary (Last 24 hours) at 03/12/2021 1032 Last data filed at 03/12/2021 0900 Gross per 24 hour  Intake 1802 ml  Output 1475 ml  Net 327 ml   Filed Weights   03/10/21 1510  Weight: 129.3 kg   Weight change:  Body mass index is 34.69 kg/m.   Physical Exam: General exam: Pleasant, elderly Caucasian male.  Not in distress Skin: No rashes, lesions or ulcers. HEENT: Atraumatic, normocephalic, no obvious bleeding Lungs: Clear to auscultation bilaterally CVS: Regular rate and rhythm, no murmur GI/Abd soft, nontender, nondistended, bowel sound present CNS: Alert, awake, oriented x3 Psychiatry: Mood appropriate Extremities: Pedal edema 1+ bilaterally, right more than left.  Right knee immobilizer in place.  Data Review: I have personally reviewed the laboratory data and studies available.  Recent Labs  Lab 03/11/21 1215 03/11/21 1629 03/12/21 0451  WBC 9.6 8.6 7.8  HGB 10.7* 9.5* 8.9*  HCT 35.1* 31.0* 29.0*  MCV 90.9 90.6 90.3  PLT 319 289 254   Recent Labs  Lab 03/11/21 1215 03/11/21 1629 03/12/21 0451  NA 137 137 140  K 3.8 4.0 4.0  CL 105 106 109  CO2 22 25 26   GLUCOSE 104* 96 97  BUN 13 12 12   CREATININE 0.90 0.82 0.94  CALCIUM 8.9 8.5* 8.7*  MG  --  1.8  --     F/u labs ordered Unresulted Labs (From admission, onward)     Start     Ordered   03/13/21 0500  Magnesium  Tomorrow morning,   R        03/12/21 0856   03/13/21 0500  Lipid panel  Tomorrow morning,   R        03/12/21 0857   03/12/21 0500  HIV Antibody (routine testing w rflx)  (HIV Antibody (Routine testing w reflex) panel)  Tomorrow morning,   R        03/11/21 1740   03/12/21 8916  Basic metabolic panel  Daily,   R      03/11/21 1740   03/12/21 0500  CBC  Daily,   R      03/11/21 1740   03/11/21 1202  Urinalysis, Routine w reflex microscopic  Once,   R        03/11/21 1201   Unscheduled  Vitamin B12  (Anemia Panel  (PNL))  Tomorrow morning,   R        03/12/21 1032   Unscheduled  Folate  (Anemia Panel (PNL))  Tomorrow morning,   R        03/12/21 1032   Unscheduled  Iron and TIBC  (Anemia Panel (PNL))  Tomorrow morning,   R        03/12/21 1032   Unscheduled  Ferritin  (Anemia Panel (PNL))  Tomorrow morning,   R        03/12/21 1032   Unscheduled  Reticulocytes  (Anemia Panel (PNL))  Tomorrow morning,   R        03/12/21 1032            Signed, Terrilee Croak, MD [facility] 03/12/2021

## 2021-03-12 NOTE — Progress Notes (Signed)
Bilateral lower extremity venous duplex has been completed. Preliminary results can be found in CV Proc through chart review.   03/12/21 12:35 PM Harry [name] RVT

## 2021-03-12 NOTE — Plan of Care (Signed)

## 2021-03-12 NOTE — Progress Notes (Signed)
    Subjective:  Patient reports pain as mild.  Denies N/V/CP/SOB.   Objective:   VITALS:   Vitals:   03/11/21 2015 03/11/21 2321 03/12/21 0509 03/12/21 0902  BP: (!) 94/56 (!) 88/73 110/74 (!) 107/52  Pulse: 85 88 85 88  Resp: 18 18 20 18   Temp: 98.6 F (37 C) 97.9 F (36.6 C) 97.8 F (36.6 C) 98.6 F (37 C)  TempSrc: Oral Oral  Oral  SpO2: 100% 96% 100% 98%  Weight:      Height:        NAD ABD soft Neurovascular intact Sensation intact distally Intact pulses distally Dorsiflexion/Plantar flexion intact   Lab Results  Component Value Date   WBC 7.8 03/12/2021   HGB 8.9 (L) 03/12/2021   HCT 29.0 (L) 03/12/2021   MCV 90.3 03/12/2021   PLT 254 03/12/2021   BMET    Component Value Date/Time   NA 140 03/12/2021 0451   K 4.0 03/12/2021 0451   CL 109 03/12/2021 0451   CO2 26 03/12/2021 0451   GLUCOSE 97 03/12/2021 0451   BUN 12 03/12/2021 0451   CREATININE 0.94 03/12/2021 0451   CALCIUM 8.7 (L) 03/12/2021 0451   GFRNONAA >60 03/12/2021 0451   GFRAA 88 (L) 05/15/2012 1005     Assessment/Plan: 1 Day Post-Op   Principal Problem:   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 Patient will need plastic surgery for coverage as well     Dorothyann Peng 03/12/2021, 10:42 AM  [facility] is now [employer] [address], [place], [place], [place] [address] Phone: [phone] [email or web address] Facebook  [employer]

## 2021-03-12 NOTE — Progress Notes (Addendum)
Progress Note  Patient Name: Harry [name] Date of Encounter: 03/12/2021  Primary Cardiologist: Donato Heinz, MD  Subjective   Strips located from yesterday - some appear to show NSR/ST with PVCs, but others look like narrow complex tachycardia questionable for atrial flutter. Telemetry also shows a similar brief abrupt onset/offset narrow complex tachycardia earlier this AM.  He states he feels great - asymptomatic from heart standpoint this AM. No CP or SOB. Reports he's had chronic exertional dyspnea for a few years - he has attributed this to COPD and doing less activity during Covid. He also relays a hx of intermittent waxing/waning edema but since his knee issues the last few weeks, RLE is more swollen.  Inpatient Medications    Scheduled Meds:  albuterol  2.5 mg Nebulization BID   atorvastatin  20 mg Oral Q1500   docusate sodium  100 mg Oral BID   enoxaparin (LOVENOX) injection  60 mg Subcutaneous Q24H   pantoprazole  40 mg Oral Daily   senna  1 tablet Oral BID   Continuous Infusions:  sodium chloride 50 mL/hr at 03/11/21 2120   acetaminophen     lactated ringers 10 mL/hr at 03/11/21 1501   tranexamic acid     PRN Meds: acetaminophen **OR** acetaminophen, hydrALAZINE, oxyCODONE   Vital Signs    Vitals:   03/11/21 2000 03/11/21 2015 03/11/21 2321 03/12/21 0509  BP: 94/63 (!) 94/56 (!) 88/73 110/74  Pulse: 79 85 88 85  Resp: (!) 22 18 18 20   Temp:  98.6 F (37 C) 97.9 F (36.6 C) 97.8 F (36.6 C)  TempSrc:  Oral Oral   SpO2: 100% 100% 96% 100%  Weight:      Height:        Intake/Output Summary (Last 24 hours) at 03/12/2021 0728 Last data filed at 03/12/2021 0507 Gross per 24 hour  Intake 1562 ml  Output 1175 ml  Net 387 ml   Last 3 Weights 03/10/2021 01/26/2021 01/22/2021  Weight (lbs) 285 lb 249 lb 12.8 oz 337 lb 15.4 oz  Weight (kg) 129.275 kg 113.309 kg 153.3 kg     Telemetry    NSR with occasional PVCs, also brief abrupt onset/offset  narrow complex tachycardia earlier this AM - Personally Reviewed  ECG    None today, will order for this admission - Personally Reviewed  Physical Exam   GEN: No acute distress. Obese. HEENT: Normocephalic, atraumatic, sclera non-icteric. Neck: No JVD or bruits. Cardiac: RRR no murmurs, rubs, or gallops.  Respiratory: Clear to auscultation bilaterally. Breathing is unlabored. GI: Soft, nontender, non-distended, BS +x 4. MS: no deformity. Extremities: No clubbing or cyanosis. Soft puffy BLE pedal edema, R>L. Neuro:  AAOx3. Follows commands. Psych:  Responds to questions appropriately with a normal affect.  Labs    High Sensitivity Troponin:  No results for input(s): TROPONINIHS in the last 720 hours.    Cardiac EnzymesNo results for input(s): TROPONINI in the last 168 hours. No results for input(s): TROPIPOC in the last 168 hours.   Chemistry Recent Labs  Lab 03/11/21 1215 03/11/21 1629 03/12/21 0451  NA 137 137 140  K 3.8 4.0 4.0  CL 105 106 109  CO2 22 25 26   GLUCOSE 104* 96 97  BUN 13 12 12   CREATININE 0.90 0.82 0.94  CALCIUM 8.9 8.5* 8.7*  PROT 7.2  --   --   ALBUMIN 3.7  --   --   AST 21  --   --  ALT 18  --   --   ALKPHOS 138*  --   --   BILITOT 0.9  --   --   GFRNONAA >60 >60 >60  ANIONGAP 10 6 5      Hematology Recent Labs  Lab 03/11/21 1215 03/11/21 1629 03/12/21 0451  WBC 9.6 8.6 7.8  RBC 3.86* 3.42* 3.21*  HGB 10.7* 9.5* 8.9*  HCT 35.1* 31.0* 29.0*  MCV 90.9 90.6 90.3  MCH 27.7 27.8 27.7  MCHC 30.5 30.6 30.7  RDW 16.2* 16.2* 16.1*  PLT 319 289 254    BNPNo results for input(s): BNP, PROBNP in the last 168 hours.   DDimer No results for input(s): DDIMER in the last 168 hours.   Radiology    No results found.  Cardiac Studies   Pending  Patient Profile     70 y.o. 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 At subsequent follow-up he was found to have necrosis of the right knee and was taken back to the OR for debridement yesterday. However, while being transferred to the OR table, had tachycardia to 140s, subsequent ventricula rbigeminy. When anesthesia was induced he became hypotensive and procedure was aborted. Cardiology asked to consult.  Assessment & Plan    1. Arrhythmia: narrow complex tachycardia suspicious for paroxysmal atrial flutter, as well as frequent PVCs - of note, baseline EKG in Epic 12/2020 showed NSR with ventricular bigeminy - will repeat EKG this admission - K 4.0, Mg 1.8 -> will give 2g mag sulfate, recheck labs in AM - TSH wnl - 2D echo pending - per discussion with Dr. Gardiner Rhyme, will trial Lopressor 12.5mg  BID - he will see later today - CHADSVASC currently 3 for HTN, age, atherosclerosis seen on CT 2019 but await echo for completeness - less ideal candidate for anticoagulation given acute orthopedic issues but may need to consider given elevated score - consider OP eval for OSA (+ some snoring and sleep talking, no prior eval)  2. Hypotension - BP has remained low at times, down to 88/73 last night, despite maintaining NSR therefore besides echo would suggest consideration of investigation of medical causes as well (possible contribution of anemia, question adrenal insufficiency, related to infection, etc) - we will obtain CTA to exclude PE - thyroid normal - await EF  3. Lower extremity edema - patient reports a prior hx of intermittent edema, but after his knee issues the last few months, has been more evident in R leg than L leg - per d/w Dr. Gardiner Rhyme, we will order LE venous duplex to exclude DVT and CTPA to exclude PE given concern for something in RA on echo - hold off diuresis given soft BP  4. 2V Coronary calcification and aortic atherosclerosis by CT 2019 - will check lipid profile in AM and consider statin  titration - will discuss pre-op risk stratification with MD, await final echocardiogram read/MD interpretation  5. Anemia - Hgb down to 8.9 this AM - admitted at 10.7 - further per primary team  Addendum: EKG reviewed, shows NSR 84bpm, occasional PVCs (multifocal in first couplet), left axis deviation with possible prior inferior infarct, poor R wave progression, QTC wnl 429ms.  For questions or updates, please contact Mon[facility] Please consult [email or web address] for contact info under Cardiology/STEMI.  Signed, Charlie Pitter, PA-C 03/12/2021, 7:28 AM  Patient seen and examined.  Agree with above documentation.  On exam, patient is alert and oriented, regular rate and rhythm, no murmurs but distant heart sounds, lungs CTAB, mild lower extremity edema with right greater than left.  Telemetry shows episode of narrow complex tachycardia with rate 140s, lasted for about 30 minutes, appears consistent with 2-1 atrial flutter.  Currently normal sinus rhythm with rate 70s to 80s with PVCs.  We will start metoprolol 12.5 mg twice daily.  Would plan to start Eliquis 5 mg twice daily following his surgery.  Echo today showed normal biventricular function though technically difficult study.  There was a hyperechoic area in the right atrium/atrial septum, likely calcification.  Will evaluate with CTPA.  Lower extremity duplex shows no DVT.  Donato Heinz, MD

## 2021-03-12 NOTE — Progress Notes (Signed)
  Echocardiogram 2D Echocardiogram has been performed.  Harry [name] 03/12/2021, 8:58 AM

## 2021-03-13 ENCOUNTER — Encounter (HOSPITAL_COMMUNITY): Payer: Self-pay | Admitting: Orthopedic Surgery

## 2021-03-13 ENCOUNTER — Inpatient Hospital Stay (HOSPITAL_COMMUNITY)
Admit: 2021-03-13 | Discharge: 2021-03-13 | Disposition: A | Discharge status: Home / Self Care | Payer: Medicare Other | Attending: Cardiology | Admitting: Cardiology

## 2021-03-13 ENCOUNTER — Other Ambulatory Visit (HOSPITAL_COMMUNITY): Payer: Self-pay

## 2021-03-13 DIAGNOSIS — I959 Hypotension, unspecified: Secondary | ICD-10-CM | POA: Diagnosis present

## 2021-03-13 DIAGNOSIS — Z96651 Presence of right artificial knee joint: Secondary | ICD-10-CM | POA: Diagnosis present

## 2021-03-13 DIAGNOSIS — I2510 Atherosclerotic heart disease of native coronary artery without angina pectoris: Secondary | ICD-10-CM | POA: Diagnosis present

## 2021-03-13 DIAGNOSIS — Z23 Encounter for immunization: Secondary | ICD-10-CM | POA: Diagnosis present

## 2021-03-13 DIAGNOSIS — I4892 Unspecified atrial flutter: Secondary | ICD-10-CM | POA: Diagnosis present

## 2021-03-13 DIAGNOSIS — Z87891 Personal history of nicotine dependence: Secondary | ICD-10-CM | POA: Diagnosis not present

## 2021-03-13 DIAGNOSIS — I890 Lymphedema, not elsewhere classified: Secondary | ICD-10-CM | POA: Diagnosis present

## 2021-03-13 DIAGNOSIS — I5189 Other ill-defined heart diseases: Secondary | ICD-10-CM

## 2021-03-13 DIAGNOSIS — L89153 Pressure ulcer of sacral region, stage 3: Secondary | ICD-10-CM | POA: Diagnosis present

## 2021-03-13 DIAGNOSIS — I96 Gangrene, not elsewhere classified: Secondary | ICD-10-CM | POA: Diagnosis present

## 2021-03-13 DIAGNOSIS — G4700 Insomnia, unspecified: Secondary | ICD-10-CM | POA: Diagnosis present

## 2021-03-13 DIAGNOSIS — E669 Obesity, unspecified: Secondary | ICD-10-CM | POA: Diagnosis present

## 2021-03-13 DIAGNOSIS — E785 Hyperlipidemia, unspecified: Secondary | ICD-10-CM | POA: Diagnosis present

## 2021-03-13 DIAGNOSIS — I480 Paroxysmal atrial fibrillation: Secondary | ICD-10-CM | POA: Diagnosis present

## 2021-03-13 DIAGNOSIS — Z85828 Personal history of other malignant neoplasm of skin: Secondary | ICD-10-CM | POA: Diagnosis not present

## 2021-03-13 DIAGNOSIS — Z6834 Body mass index (BMI) 34.0-34.9, adult: Secondary | ICD-10-CM | POA: Diagnosis not present

## 2021-03-13 DIAGNOSIS — E876 Hypokalemia: Secondary | ICD-10-CM | POA: Diagnosis present

## 2021-03-13 DIAGNOSIS — L7682 Other postprocedural complications of skin and subcutaneous tissue: Secondary | ICD-10-CM | POA: Diagnosis present

## 2021-03-13 DIAGNOSIS — D519 Vitamin B12 deficiency anemia, unspecified: Secondary | ICD-10-CM | POA: Diagnosis present

## 2021-03-13 DIAGNOSIS — D62 Acute posthemorrhagic anemia: Secondary | ICD-10-CM | POA: Diagnosis present

## 2021-03-13 DIAGNOSIS — J449 Chronic obstructive pulmonary disease, unspecified: Secondary | ICD-10-CM | POA: Diagnosis present

## 2021-03-13 DIAGNOSIS — L89610 Pressure ulcer of right heel, unstageable: Secondary | ICD-10-CM | POA: Diagnosis present

## 2021-03-13 DIAGNOSIS — I9789 Other postprocedural complications and disorders of the circulatory system, not elsewhere classified: Secondary | ICD-10-CM | POA: Diagnosis not present

## 2021-03-13 DIAGNOSIS — R000 Tachycardia, unspecified: Secondary | ICD-10-CM | POA: Diagnosis present

## 2021-03-13 DIAGNOSIS — Y831 Surgical operation with implant of artificial internal device as the cause of abnormal reaction of the patient, or of later complication, without mention of misadventure at the time of the procedure: Secondary | ICD-10-CM | POA: Diagnosis present

## 2021-03-13 DIAGNOSIS — I10 Essential (primary) hypertension: Secondary | ICD-10-CM | POA: Diagnosis present

## 2021-03-13 DIAGNOSIS — Z20822 Contact with and (suspected) exposure to covid-19: Secondary | ICD-10-CM | POA: Diagnosis present

## 2021-03-13 LAB — BASIC METABOLIC PANEL
Anion gap: 9 (ref 5–15)
BUN: 9 mg/dL (ref 8–23)
CO2: 24 mmol/L (ref 22–32)
Calcium: 8.1 mg/dL — ABNORMAL LOW (ref 8.9–10.3)
Chloride: 103 mmol/L (ref 98–111)
Creatinine, Ser: 0.9 mg/dL (ref 0.61–1.24)
GFR, Estimated: >60 mL/min (ref >60)
Glucose, Bld: 97 mg/dL (ref 70–99)
Potassium: 3.8 mmol/L (ref 3.5–5.1)
Sodium: 136 mmol/L (ref 135–145)

## 2021-03-13 LAB — MAGNESIUM: Magnesium: 2.2 mg/dL (ref 1.7–2.4)

## 2021-03-13 LAB — RETICULOCYTES
Immature Retic Fract: 29.5 % — ABNORMAL HIGH (ref 2.3–15.9)
RBC.: 3.09 MIL/uL — ABNORMAL LOW (ref 4.22–5.81)
Retic Count, Absolute: 78.5 10*3/uL (ref 19.0–186.0)
Retic Ct Pct: 2.5 % (ref 0.4–3.1)

## 2021-03-13 LAB — CBC
HCT: 28.9 % — ABNORMAL LOW (ref 39.0–52.0)
Hemoglobin: 8.8 g/dL — ABNORMAL LOW (ref 13.0–17.0)
MCH: 27.6 pg (ref 26.0–34.0)
MCHC: 30.4 g/dL (ref 30.0–36.0)
MCV: 90.6 fL (ref 80.0–100.0)
Platelets: 262 10*3/uL (ref 150–400)
RBC: 3.19 MIL/uL — ABNORMAL LOW (ref 4.22–5.81)
RDW: 15.9 % — ABNORMAL HIGH (ref 11.5–15.5)
WBC: 7.1 10*3/uL (ref 4.0–10.5)
nRBC: 0 % (ref 0.0–0.2)

## 2021-03-13 LAB — LIPID PANEL
Cholesterol: 80 mg/dL (ref 0–200)
HDL: 29 mg/dL — ABNORMAL LOW (ref >40)
LDL Cholesterol: 39 mg/dL (ref 0–99)
Total CHOL/HDL Ratio: 2.8 RATIO
Triglycerides: 58 mg/dL (ref <150)
VLDL: 12 mg/dL (ref 0–40)

## 2021-03-13 LAB — IRON AND TIBC
Iron: 25 ug/dL — ABNORMAL LOW (ref 45–182)
Saturation Ratios: 14 % — ABNORMAL LOW (ref 17.9–39.5)
TIBC: 182 ug/dL — ABNORMAL LOW (ref 250–450)
UIBC: 157 ug/dL

## 2021-03-13 LAB — FOLATE: Folate: 12.8 ng/mL (ref >5.9)

## 2021-03-13 LAB — FERRITIN: Ferritin: 66 ng/mL (ref 24–336)

## 2021-03-13 LAB — VITAMIN B12: Vitamin B-12: 274 pg/mL (ref 180–914)

## 2021-03-13 MED ORDER — METOPROLOL TARTRATE 25 MG PO TABS
25.0000 mg | ORAL_TABLET | Freq: Two times a day (BID) | ORAL | Status: DC
  Administered 2021-03-16 – 2021-03-21 (×8): 25 mg via ORAL
  Filled 2021-03-13 (×15): qty 1

## 2021-03-13 MED ORDER — LORAZEPAM 1 MG PO TABS
1.0000 mg | ORAL_TABLET | ORAL | Status: DC
  Administered 2021-03-13: 1 mg via ORAL
  Filled 2021-03-13: qty 1

## 2021-03-13 MED ORDER — ENOXAPARIN SODIUM 150 MG/ML IJ SOSY
130.0000 mg | PREFILLED_SYRINGE | Freq: Two times a day (BID) | INTRAMUSCULAR | Status: AC
Start: 2021-03-14 — End: 2021-03-17
  Administered 2021-03-14 – 2021-03-17 (×7): 130 mg via SUBCUTANEOUS
  Filled 2021-03-13 (×7): qty 0.86

## 2021-03-13 MED ORDER — ENOXAPARIN SODIUM 80 MG/0.8ML IJ SOSY
70.0000 mg | PREFILLED_SYRINGE | Freq: Once | INTRAMUSCULAR | Status: AC
  Administered 2021-03-13: 70 mg via SUBCUTANEOUS
  Filled 2021-03-13: qty 0.8

## 2021-03-13 MED ORDER — TRAZODONE HCL 50 MG PO TABS
50.0000 mg | ORAL_TABLET | Freq: Once | ORAL | Status: AC
  Administered 2021-03-14: 50 mg via ORAL
  Filled 2021-03-13: qty 1

## 2021-03-13 MED ORDER — VITAMIN B-12 1000 MCG PO TABS
1000.0000 ug | ORAL_TABLET | Freq: Every day | ORAL | Status: DC
  Administered 2021-03-13 – 2021-03-31 (×17): 1000 ug via ORAL
  Filled 2021-03-13 (×17): qty 1

## 2021-03-13 MED ORDER — LORAZEPAM 2 MG/ML IJ SOLN: 0.5000 mg | Freq: Once | INTRAMUSCULAR | Status: DC | PRN

## 2021-03-13 MED ORDER — POTASSIUM CHLORIDE CRYS ER 20 MEQ PO TBCR
20.0000 meq | EXTENDED_RELEASE_TABLET | Freq: Once | ORAL | Status: AC
  Administered 2021-03-13: 20 meq via ORAL
  Filled 2021-03-13: qty 1

## 2021-03-13 NOTE — Plan of Care (Signed)

## 2021-03-13 NOTE — Progress Notes (Addendum)
Progress Note  Patient Name: Harry [name] Date of Encounter: 03/13/2021  Primary Cardiologist: Donato Heinz, MD  Subjective   Feeling well, no complaints. No CP or SOB. Mild edema persists. Discussed 2D echo/RA findings.  Inpatient Medications    Scheduled Meds:  albuterol  2.5 mg Nebulization BID   atorvastatin  20 mg Oral Q1500   docusate sodium  100 mg Oral BID   enoxaparin (LOVENOX) injection  60 mg Subcutaneous Q24H   metoprolol tartrate  12.5 mg Oral BID   pantoprazole  40 mg Oral Daily   senna  1 tablet Oral BID   Continuous Infusions:  sodium chloride 50 mL/hr at 03/12/21 1506   lactated ringers 10 mL/hr at 03/11/21 1501   PRN Meds: acetaminophen **OR** acetaminophen, hydrALAZINE, oxyCODONE   Vital Signs    Vitals:   03/12/21 1203 03/12/21 2003 03/12/21 2128 03/13/21 0439  BP: 102/72 101/62 100/63 127/70  Pulse: 80 79 78 80  Resp: (!) 24 18  18   Temp: 98.5 F (36.9 C) 98.1 F (36.7 C)  98 F (36.7 C)  TempSrc: Oral Oral  Oral  SpO2: 100% 96%  98%  Weight:      Height:        Intake/Output Summary (Last 24 hours) at 03/13/2021 0716 Last data filed at 03/13/2021 0600 Gross per 24 hour  Intake 1986.63 ml  Output 2100 ml  Net -113.37 ml   Last 3 Weights 03/10/2021 01/26/2021 01/22/2021  Weight (lbs) 285 lb 249 lb 12.8 oz 337 lb 15.4 oz  Weight (kg) 129.275 kg 113.309 kg 153.3 kg     Telemetry    NSR with occasional PVCs, short runs of atrial arrhythmia (? A-tach vs flutter) - Personally Reviewed  Physical Exam   GEN: No acute distress. Obese. HEENT: Normocephalic, atraumatic, sclera non-icteric. Neck: No JVD or bruits. Cardiac: RRR no murmurs, rubs, or gallops.  Respiratory: Clear to auscultation bilaterally. Breathing is unlabored. GI: Soft, nontender, non-distended, BS +x 4. MS: no deformity. Extremities: No clubbing or cyanosis. Mild BLE edema R>L. Distal pedal pulses are 2+ and equal bilaterally. Neuro:  AAOx3. Follows  commands. Psych:  Responds to questions appropriately with a normal affect.  Labs    High Sensitivity Troponin:  No results for input(s): TROPONINIHS in the last 720 hours.    Cardiac EnzymesNo results for input(s): TROPONINI in the last 168 hours. No results for input(s): TROPIPOC in the last 168 hours.   Chemistry Recent Labs  Lab 03/11/21 1215 03/11/21 1629 03/12/21 0451 03/13/21 0431  NA 137 137 140 136  K 3.8 4.0 4.0 3.8  CL 105 106 109 103  CO2 22 25 26 24   GLUCOSE 104* 96 97 97  BUN 13 12 12 9   CREATININE 0.90 0.82 0.94 0.90  CALCIUM 8.9 8.5* 8.7* 8.1*  PROT 7.2  --   --   --   ALBUMIN 3.7  --   --   --   AST 21  --   --   --   ALT 18  --   --   --   ALKPHOS 138*  --   --   --   BILITOT 0.9  --   --   --   GFRNONAA >60 >60 >60 >60  ANIONGAP 10 6 5 9      Hematology Recent Labs  Lab 03/11/21 1629 03/12/21 0451 03/13/21 0431  WBC 8.6 7.8 7.1  RBC 3.42* 3.21* 3.19*  3.09*  HGB 9.5* 8.9* 8.8*  HCT 31.0* 29.0* 28.9*  MCV 90.6 90.3 90.6  MCH 27.8 27.7 27.6  MCHC 30.6 30.7 30.4  RDW 16.2* 16.1* 15.9*  PLT 289 254 262    BNPNo results for input(s): BNP, PROBNP in the last 168 hours.   DDimer No results for input(s): DDIMER in the last 168 hours.   Radiology    CT Angio Chest Pulmonary Embolism (PE) W or WO Contrast  Result Date: 03/12/2021 CLINICAL DATA:  Recent knee surgery, complicated by surgical revision, acute hypotension, atrial fibrillation, concern for acute PE EXAM: CT ANGIOGRAPHY CHEST WITH CONTRAST TECHNIQUE: Multidetector CT imaging of the chest was performed using the standard protocol during bolus administration of intravenous contrast. Multiplanar CT image reconstructions and MIPs were obtained to evaluate the vascular anatomy. CONTRAST:  56mL OMNIPAQUE IOHEXOL 350 MG/ML SOLN COMPARISON:  05/22/2018 FINDINGS: Cardiovascular: Central pulmonary arteries are normal in caliber and patent. No significant central, saddle, or proximal hilar acute  significant pulmonary embolus. Limited assessment of the peripheral segmental branches related to motion artifact and peripheral contrast opacification. Thoracic aortic atherosclerosis without aneurysm or dissection. No acute aortic process. Patent 3 vessel arch anatomy. No mediastinal hemorrhage or hematoma. Native coronary atherosclerosis. Normal heart size. No pericardial effusion. Central venous structures are patent.  No veno-occlusive process. Mediastinum/Nodes: No enlarged mediastinal, hilar, or axillary lymph nodes. Thyroid gland, trachea, and esophagus demonstrate no significant findings. Lungs/Pleura: Trace dependent basilar atelectasis. No acute airspace process, pneumonia, collapse or consolidation. Negative for edema or interstitial process. No pleural effusion or pneumothorax. Upper Abdomen: Similar scattered numerous hepatic hypodense cysts. Liver is only partially imaged. Gallbladder nondistended. Suspect layering gallbladder sludge versus noncalcified stones. Common bile duct nondilated. Colonic diverticulosis. Aortic atherosclerosis. No acute upper abdominal finding. Musculoskeletal: Degenerative changes of the spine. No chest wall soft tissue abnormality or asymmetry. No acute osseous finding. Review of the MIP images confirms the above findings. IMPRESSION: Negative for significant acute central PE as above. Limited assessment of the smaller peripheral branches. No other acute intrathoracic vascular or non vascular finding. Coronary atherosclerosis Aortic Atherosclerosis (ICD10-I70.0). Electronically Signed   By: Jerilynn Mages.  Shick M.D.   On: 03/12/2021 16:32   ECHOCARDIOGRAM COMPLETE  Result Date: 03/12/2021    ECHOCARDIOGRAM REPORT   Patient Name:   Harry [name] [facility] Date of Exam: 03/12/2021 Medical Rec #:  [id number]         Height:       76.0 in Accession #:    [id number]        Weight:       285.0 lb Date of Birth:  14-Apr-1951         BSA:          2.575 m Patient Age:    70 years          BP:            110/74 mmHg Patient Gender: M                 HR:           63 bpm. Exam Location:  Inpatient Procedure: 2D Echo, Cardiac Doppler, Color Doppler and Intracardiac            Opacification Agent Indications:    R94.31 Abnormal EKG  History:        Patient has no prior history of Echocardiogram examinations.                 Abnormal ECG, Arrythmias:Atrial Fibrillation and Tachycardia;  Risk Factors:Hypertension. Canner.  Sonographer:    Roseanna Rainbow RDCS Referring Phys: [id number] [facility]  Sonographer Comments: Technically difficult study due to poor echo windows, Technically challenging study due to limited acoustic windows, suboptimal parasternal window, suboptimal apical window, suboptimal subcostal window, no parasternal window and patient is morbidly obese. Image acquisition challenging due to patient body habitus and Image acquisition challenging due to respiratory motion. Nearly non- diagnostic exam. IMPRESSIONS  1. Extremely limited; definity used; normal LV function; hyperechoic area superior right atrium and atrial septum; likely calcification; suggest TEE or cardiac MRI to further assess.  2. Left ventricular ejection fraction, by estimation, is 60 to 65%. The left ventricle has normal function. The left ventricle has no regional wall motion abnormalities. There is mild left ventricular hypertrophy. Left ventricular diastolic parameters are consistent with Grade I diastolic dysfunction (impaired relaxation).  3. Right ventricular systolic function is normal. The right ventricular size is normal.  4. The mitral valve is normal in structure. No evidence of mitral valve regurgitation. No evidence of mitral stenosis.  5. The aortic valve was not well visualized. Aortic valve regurgitation is not visualized. No aortic stenosis is present.  6. Aortic dilatation noted. There is mild dilatation of the aortic root, measuring 40 mm.  7. The inferior vena cava is normal in size with greater than 50%  respiratory variability, suggesting right atrial pressure of 3 mmHg. FINDINGS  Left Ventricle: Left ventricular ejection fraction, by estimation, is 60 to 65%. The left ventricle has normal function. The left ventricle has no regional wall motion abnormalities. Definity contrast agent was given IV to delineate the left ventricular  endocardial borders. The left ventricular internal cavity size was normal in size. There is mild left ventricular hypertrophy. Left ventricular diastolic parameters are consistent with Grade I diastolic dysfunction (impaired relaxation). Right Ventricle: The right ventricular size is normal. Right ventricular systolic function is normal. Left Atrium: Left atrial size was normal in size. Right Atrium: Right atrial size was normal in size. Pericardium: Trivial pericardial effusion is present. Mitral Valve: The mitral valve is normal in structure. No evidence of mitral valve regurgitation. No evidence of mitral valve stenosis. Tricuspid Valve: The tricuspid valve is normal in structure. Tricuspid valve regurgitation is not demonstrated. No evidence of tricuspid stenosis. Aortic Valve: The aortic valve was not well visualized. Aortic valve regurgitation is not visualized. No aortic stenosis is present. Pulmonic Valve: The pulmonic valve was not well visualized. Pulmonic valve regurgitation is not visualized. Aorta: Aortic dilatation noted. There is mild dilatation of the aortic root, measuring 40 mm. Venous: The inferior vena cava is normal in size with greater than 50% respiratory variability, suggesting right atrial pressure of 3 mmHg. IAS/Shunts: The interatrial septum was not well visualized. Additional Comments: Extremely limited; definity used; normal LV function; hyperechoic area superior right atrium and atrial septum; likely calcification; suggest TEE or cardiac MRI to further assess.  LEFT VENTRICLE PLAX 2D LVIDd:         5.10 cm LVIDs:         3.90 cm LV PW:         1.40 cm LV IVS:         1.40 cm LVOT diam:     2.30 cm LV SV:         66 LV SV Index:   25 LVOT Area:     4.15 cm  LV Volumes (MOD) LV vol d, MOD A2C: 120.0 ml LV vol d, MOD A4C: 192.0 ml  LV vol s, MOD A2C: 64.7 ml LV vol s, MOD A4C: 71.7 ml LV SV MOD A2C:     55.3 ml LV SV MOD A4C:     192.0 ml LV SV MOD BP:      83.3 ml RIGHT VENTRICLE RV S prime:     15.70 cm/s TAPSE (M-mode): 1.8 cm LEFT ATRIUM             Index       RIGHT ATRIUM           Index LA diam:        3.90 cm 1.51 cm/m  RA Area:     22.00 cm LA Vol (A2C):   76.7 ml 29.79 ml/m RA Volume:   70.60 ml  27.42 ml/m LA Vol (A4C):   45.4 ml 17.63 ml/m LA Biplane Vol: 60.3 ml 23.42 ml/m  AORTIC VALVE LVOT Vmax:   94.90 cm/s LVOT Vmean:  59.300 cm/s LVOT VTI:    0.158 m  AORTA Ao Root diam: 4.00 cm MITRAL VALVE MV Area (PHT): 2.80 cm    SHUNTS MV Decel Time: 271 msec    Systemic VTI:  0.16 m MV E velocity: 45.00 cm/s  Systemic Diam: 2.30 cm MV A velocity: 74.60 cm/s MV E/A ratio:  0.60 Kirk Ruths MD Electronically signed by Kirk Ruths MD Signature Date/Time: 03/12/2021/11:48:23 AM    Final    VAS [place] LOWER EXTREMITY VENOUS (DVT)  Result Date: 03/12/2021  Lower Venous DVT Study Patient Name:  Harry [name] [facility]  Date of Exam:   03/12/2021 Medical Rec #: [id number]          Accession #:    [id number] Date of Birth: 1950/11/09          Patient Gender: M Patient Age:   58 years Exam Location:  [facility] Procedure:      VAS [place] LOWER EXTREMITY VENOUS (DVT) Referring Phys: Lisbeth Renshaw DUNN --------------------------------------------------------------------------------  Indications: Edema.  Risk Factors: Surgery. Limitations: Body habitus, poor ultrasound/tissue interface, bandages and orthopaedic appliance. Comparison Study: No prior studies. Performing Technologist: Oliver Hum RVT  Examination Guidelines: A complete evaluation includes B-mode imaging, spectral Doppler, color Doppler, and power Doppler as needed of all accessible portions of each vessel.  Bilateral testing is considered an integral part of a complete examination. Limited examinations for reoccurring indications may be performed as noted. The reflux portion of the exam is performed with the patient in reverse Trendelenburg.  +---------+---------------+---------+-----------+----------+-------------------+ RIGHT    CompressibilityPhasicitySpontaneityPropertiesThrombus Aging      +---------+---------------+---------+-----------+----------+-------------------+ CFV      Full           Yes      Yes                                      +---------+---------------+---------+-----------+----------+-------------------+ SFJ      Full                                                             +---------+---------------+---------+-----------+----------+-------------------+ FV Prox  Full                                                             +---------+---------------+---------+-----------+----------+-------------------+  FV Mid   Full                                                             +---------+---------------+---------+-----------+----------+-------------------+ FV Distal               Yes      Yes                                      +---------+---------------+---------+-----------+----------+-------------------+ PFV      Full                                                             +---------+---------------+---------+-----------+----------+-------------------+ POP      Full           Yes      Yes                                      +---------+---------------+---------+-----------+----------+-------------------+ PTV      Full                                                             +---------+---------------+---------+-----------+----------+-------------------+ PERO                                                  Not well visualized +---------+---------------+---------+-----------+----------+-------------------+    +---------+---------------+---------+-----------+----------+--------------+ LEFT     CompressibilityPhasicitySpontaneityPropertiesThrombus Aging +---------+---------------+---------+-----------+----------+--------------+ CFV      Full           Yes      Yes                                 +---------+---------------+---------+-----------+----------+--------------+ SFJ      Full                                                        +---------+---------------+---------+-----------+----------+--------------+ FV Prox  Full                                                        +---------+---------------+---------+-----------+----------+--------------+ FV Mid   Full                                                        +---------+---------------+---------+-----------+----------+--------------+  FV Distal               Yes      Yes                                 +---------+---------------+---------+-----------+----------+--------------+ PFV      Full                                                        +---------+---------------+---------+-----------+----------+--------------+ POP      Full           Yes      Yes                                 +---------+---------------+---------+-----------+----------+--------------+ PTV      Full                                                        +---------+---------------+---------+-----------+----------+--------------+ PERO     Full                                                        +---------+---------------+---------+-----------+----------+--------------+     Summary: RIGHT: - There is no evidence of deep vein thrombosis in the lower extremity. However, portions of this examination were limited- see technologist comments above.  - No cystic structure found in the popliteal fossa.  LEFT: - There is no evidence of deep vein thrombosis in the lower extremity. However, portions of this examination were  limited- see technologist comments above.  - No cystic structure found in the popliteal fossa.  *See table(s) above for measurements and observations. Electronically signed by Jamelle Haring on 03/12/2021 at 6:53:04 PM.    Final     Cardiac Studies   2D Echo 03/12/21  1. Extremely limited; definity used; normal LV function; hyperechoic area  superior right atrium and atrial septum; likely calcification; suggest TEE  or cardiac MRI to further assess.   2. Left ventricular ejection fraction, by estimation, is 60 to 65%. The  left ventricle has normal function. The left ventricle has no regional  wall motion abnormalities. There is mild left ventricular hypertrophy.  Left ventricular diastolic parameters  are consistent with Grade I diastolic dysfunction (impaired relaxation).   3. Right ventricular systolic function is normal. The right ventricular  size is normal.   4. The mitral valve is normal in structure. No evidence of mitral valve  regurgitation. No evidence of mitral stenosis.   5. The aortic valve was not well visualized. Aortic valve regurgitation  is not visualized. No aortic stenosis is present.   6. Aortic dilatation noted. There is mild dilatation of the aortic root,  measuring 40 mm.   7. The inferior vena cava is normal in size with greater than 50%  respiratory variability, suggesting right atrial pressure of 3 mmHg.   Patient Profile  70 y.o. 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 At subsequent follow-up he was found to have necrosis of the right knee and was taken back to the OR for debridement yesterday. However, while being transferred to the OR table, had tachycardia to 140s, subsequent ventricula rbigeminy. When anesthesia was induced he became hypotensive and procedure was aborted.  Cardiology asked to consult.  Assessment & Plan    1. Arrhythmias: narrow complex tachycardia suspicious for paroxysmal atrial flutter, as well as frequent PVCs - of note, prior EKG 12/2020 showed NSR with ventricular bigeminy so PVCs are not entirely new to this admission - TSH wnl - 2D echo images extremely limited, EF 60-65%, grade 1 DD, mild dilation of aortic root, and hyperechoic area in RA - CHADSVASC 3 for HTN, age, atherosclerosis (CT 2019) - recommend starting Eliquis 5mg  BID.  However, as might be going for surgery soon and considering anemia, could start therapeutic lovenox to ensure tolerating anticoagulation for now.  Following surgery can start Eliquis 5 mg twice daily - consider OP eval for OSA (+ some snoring and sleep talking, no prior eval) - Mg 2.2 today after repletion, continue to follow- will give 94meq KCl for 3.8 (goal lytes Mg 2.0 or greater, K 4.0 or greater) -Continue metoprolol, will increase to 25 mg twice daily -No further cardiac work-up recommended prior to surgery   2. Hypotension - occurred with induction of anesthesia but BP has also been intermittently low throughout admission, including while in NSR, therefore may be medical component - CTA did not show PE, thyroid normal, EF normal - BP improved this AM  3. Abnormality in right atrium on echo -Cardiac MRI today was limited study due to patient's claustrophobia, but no evidence of right atrial mass seen.  No PE on CTPA   4. Lower extremity edema - patient reports a prior hx of intermittent edema, but after his knee issues the last few months, has been more evident in R leg than L leg - may be related to ongoing ortho issues - LE venous duplex negative for DVT albeit parts of study limited, CTA negative for PE - hold off diuresis given soft BP   5. 2V Coronary calcification and aortic atherosclerosis by CT 2019 - no recent chest pain - continue statin given LDL 39 - hold off ASA given plan for surgery  and also plan to use Eliquis going forward post-surgery  5. Anemia - Hgb 10.7 on admission (downtrend over several months in context of orthopedic issues, currently 8.8 today) - further per primary team  [facility] will sign off.   Medication Recommendations: Eliquis 5 mg twice daily once okay from surgery standpoint (can start therapeutic Lovenox until surgery), metoprolol 25 mg twice daily Other recommendations (labs, testing, etc): None Follow up as an outpatient: Scheduled for 10/14   For questions or updates, please contact [facility] Please consult [email or web address] for contact info under Cardiology/STEMI.  Signed, Charlie Pitter, PA-C 03/13/2021, 7:16 AM    Patient seen and examined.  Agree with above documentation.  On exam, patient is alert and oriented, regular rate and rhythm, no murmurs, lungs CTAB, mild LE edema.  Cardiac MRI today was limited study as he was unable to tolerate due to claustrophobia, but no right atrial mass seen and appeared normal biventricular function.  Discussed with  Dr.Dahal, and for atrial flutter, would recommend starting anticoagulation.  He may be going for surgery early next week.  Could trial therapeutic Lovenox and make sure he is tolerating anticoagulation as does have anemia of unclear origin.  Can plan to transition to Eliquis 5 mg twice daily after surgery.  BP appears improved, will increase metoprolol to 25 mg twice daily.  Donato Heinz, MD

## 2021-03-13 NOTE — Progress Notes (Signed)
PROGRESS NOTE  Harry [name]  DOB: [birth year]  PCP: Buzzy Han, MD FIE:[id number]  DOA: 03/11/2021  LOS: 1 day  Hospital Day: 3  Chief complaint: Tachycardia and hypotension postanesthesia  Brief narrative: Harry [name] is a 70 y.o. male with PMH significant for HTN, COPD, past history of smoking, chronic bilateral lower extremity lymphedema who underwent right knee arthroplasty on 01/07/2021 and was recuperating in the rehab.   On 8/1, he suffered an injury to the extensor mechanism during the rehab.  He while attempting to walk off of the stairs, he dislocated his knee and was brought to the ED.  At that time, x-ray showed anterior knee hardware dislocation.  It was successfully reduced.  Next day on 8/2, repeat x-ray showed recurrent dislocation of right knee arthroplasty.  It was successfully reduced again but patient had persistent patella alta. On 8/3 orthopedic surgeon Dr. Lyla Glassing to him to the OR for revision of total knee arthroplasty and reconstruction of the extensor mechanism and MCL. At his immediate postop visit, he was found to have some questionable skin flap viability.  He was treated for a total of 6 weeks in a cylinder cast and had wound checks at regular intervals. He was discharged from rehab to home 6 days ago.   On follow-up on 9/20, his wound was found to be fully demarcated and was therefore indicated for debridement. He was to the hospital today for elective irrigation and debridement of right knee. Patient was intubated for the procedure.  However upon induction of anesthesia, before the procedure had started, patient developed hypotension down to 60s in A. fib with RVR as well.  Anesthesia canceled the surgery. Patient was in the critical care and cardiology. Admitted to hospital service. See below for details  Subjective: Patient was seen and examined this morning.  Propped up in bed.  Not in distress.  Not on supplemental oxygen.  Feels  good.  He is getting ready to be moved to [place] for cardiac MRI.  Assessment/Plan: Tachycardia and hypotension postanesthesia. -Per report, patient had tachycardia to 140s after anesthesia induction and before the procedure was started.  It lasted for few minutes and his heart rate and blood pressure improved by the time he was brought to the PACU.   -Rhythm strips obtained during the event were reviewed.  A flutter 2-1 per cardiology.  He has been started on metoprolol.  Not a plan of anticoagulation with Eliquis after the surgery. Recent Labs  Lab 03/11/21 1215 03/11/21 1629 03/12/21 0451 03/13/21 0431  K 3.8 4.0 4.0 3.8  MG  --  1.8  --  2.2    Hypotension -Per report, he was hypotensive down to 60s after anesthesia induction. -He has history of hypertension and is on lisinopril as needed.  Currently blood pressure stable. -Continue to monitor.  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  Noted that cardiology has planned to do a cardiac MRI today.     Chronic bilateral lower extremity lymphedema -Denies a known history of CHF.  Not on any diuretics at home.   -Echocardiogram 1/22 showed EF of 60 to 65%, no regional wall motion abnormality, mild LVH, RV size and function normal.   -Ultrasound duplex lower extremities and CTPA negative for DVT PE -Bilateral lower extremity edema might have been secondary to immobilization.     History of COPD -Stopped smoking 14 years ago.   Hyperlipidemia -Continue statin.  Keep aspirin on hold.    Component Value Date/Time  CHOL 80 03/13/2021 0431   TRIG 58 03/13/2021 0431   HDL 29 (L) 03/13/2021 0431   CHOLHDL 2.8 03/13/2021 0431   VLDL 12 03/13/2021 0431   LDLCALC 39 03/13/2021 0431    Anemia -Patient had normal hemoglobin over 12 till the knee arthroplasty June 2022.  Since then his hemoglobin level has been running less than 9 mostly.  No evidence of active bleeding.  Hemoglobin currently between 8 and 9.  Ferritin level  appropriate at 66, vitamin B12 level at the lower limit of normal.  We will start oral replacement. Recent Labs    01/24/21 0334 03/11/21 1215 03/11/21 1629 03/12/21 0451 03/13/21 0431  HGB 7.8* 10.7* 9.5* 8.9* 8.8*  MCV 92.0 90.9 90.6 90.3 90.6  VITAMINB12  --   --   --   --  274  FOLATE  --   --   --   --  12.8  FERRITIN  --   --   --   --  66  TIBC  --   --   --   --  182*  IRON  --   --   --   --  25*  RETICCTPCT  --   --   --   --  2.5   Right knee arthroplasty, dislocation, patella alta -Orthopedics following.  Previously planned procedure for 9/21 has been postponed till cardiology clearance.  Mobility: PT eval postprocedure Code Status:   Code Status: Full Code  Nutritional status: Body mass index is 34.69 kg/m.     Diet:  Diet Order             Diet Heart Room service appropriate? Yes; Fluid consistency: Thin  Diet effective now                  DVT prophylaxis: Lovenox subcu   Antimicrobials: None Fluid: Normal saline at 50 mill per hour Consultants: Cardiology, orthopedics Family Communication: None at bedside  Status is: Observation  Remains inpatient appropriate because: Pending cardiac MRI today.  After cardiology clearance, patient will undergo surgery by orthopedics and plastic surgery  Dispo: The patient is from: Home              Anticipated d/c is to: Home versus rehab              Patient currently is not medically stable to d/c.   Difficult to place patient No     Infusions:   sodium chloride 50 mL/hr at 03/12/21 1506   lactated ringers 10 mL/hr at 03/11/21 1501    Scheduled Meds:  albuterol  2.5 mg Nebulization BID   atorvastatin  20 mg Oral Q1500   docusate sodium  100 mg Oral BID   enoxaparin (LOVENOX) injection  60 mg Subcutaneous Q24H   LORazepam  1 mg Oral UD   metoprolol tartrate  12.5 mg Oral BID   pantoprazole  40 mg Oral Daily   potassium chloride  20 mEq Oral Once   senna  1 tablet Oral BID   vitamin B-12  1,000  mcg Oral Daily    Antimicrobials: Anti-infectives (From admission, onward)    Start     Dose/Rate Route Frequency Ordered Stop   03/11/21 1215  ceFAZolin (ANCEF) IVPB 2g/100 mL premix        2 g 200 mL/hr over 30 Minutes Intravenous On call to O.R. 03/11/21 1201 03/12/21 0559   03/11/21 1215  vancomycin (VANCOREADY) IVPB 1500 mg/300 mL  1,500 mg 150 mL/hr over 120 Minutes Intravenous On call to O.R. 03/11/21 1201 03/11/21 1535       PRN meds: acetaminophen **OR** acetaminophen, hydrALAZINE, oxyCODONE   Objective: Vitals:   03/13/21 0439 03/13/21 0750  BP: 127/70   Pulse: 80   Resp: 18   Temp: 98 F (36.7 C)   SpO2: 98% 95%    Intake/Output Summary (Last 24 hours) at 03/13/2021 1038 Last data filed at 03/13/2021 0829 Gross per 24 hour  Intake 1746.63 ml  Output 2275 ml  Net -528.37 ml   Filed Weights   03/10/21 1510  Weight: 129.3 kg   Weight change:  Body mass index is 34.69 kg/m.   Physical Exam: General exam: Pleasant, elderly Caucasian male.  Not in distress Skin: No rashes, lesions or ulcers. HEENT: Atraumatic, normocephalic, no obvious bleeding Lungs: Clear to auscultation bilaterally CVS: Regular rate and rhythm, no murmur GI/Abd soft, nontender, nondistended, bowel sound present CNS: Alert, awake, oriented x3 Psychiatry: Mood appropriate Extremities: Continues to have pedal edema 1+ bilaterally, right more than left.  Right knee immobilizer in place.  Data Review: I have personally reviewed the laboratory data and studies available.  Recent Labs  Lab 03/11/21 1215 03/11/21 1629 03/12/21 0451 03/13/21 0431  WBC 9.6 8.6 7.8 7.1  HGB 10.7* 9.5* 8.9* 8.8*  HCT 35.1* 31.0* 29.0* 28.9*  MCV 90.9 90.6 90.3 90.6  PLT 319 289 254 262   Recent Labs  Lab 03/11/21 1215 03/11/21 1629 03/12/21 0451 03/13/21 0431  NA 137 137 140 136  K 3.8 4.0 4.0 3.8  CL 105 106 109 103  CO2 22 25 26 24   GLUCOSE 104* 96 97 97  BUN 13 12 12 9   CREATININE  0.90 0.82 0.94 0.90  CALCIUM 8.9 8.5* 8.7* 8.1*  MG  --  1.8  --  2.2    F/u labs ordered Unresulted Labs (From admission, onward)     Start     Ordered   03/14/21 0500  Magnesium  Tomorrow morning,   R        03/13/21 0940   03/12/21 0211  Basic metabolic panel  Daily,   R      03/11/21 1740   03/12/21 0500  CBC  Daily,   R      03/11/21 1740   03/11/21 1202  Urinalysis, Routine w reflex microscopic  Once,   R        03/11/21 1201            Signed, Terrilee Croak, MD [facility] 03/13/2021

## 2021-03-13 NOTE — Consult Note (Addendum)
[facility] Nurse Consult Note: Reason for Consult: Consult requested for right heel and sacrum wounds. 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 No odor, drainage, or fluctuance. Sacrum with 2 areas of Stage 3 pressure injuries, separated by a narrow bridge of skin in between.  Total area is approx 4.5X3.5X.2cm. 80% red and moist, 20% white macerated skin. Small amt yellow drainage. Pressure Injury POA: Yes Assessed wound appearance with Pt's wife at the bedside and discussed plan of care.  Dressing procedure/placement/frequency: It is best practice to leave dry stable eschar in place to heel pressure injuries.  Float heels to reduce pressure. Topical treatment orders provided for bedside nurses to perform as follows to protect from further injury: Foam dressing to right heel and sacrum wounds; change Q 3 days or PRN soiling. Please re-consult if further assistance is needed.  Thank-you,  Julien Girt MSN, [place], [place], [place], [place]

## 2021-03-13 NOTE — Plan of Care (Signed)

## 2021-03-13 NOTE — Progress Notes (Signed)
ANTICOAGULATION CONSULT NOTE - Initial Consult  Pharmacy Consult for enoxaparin Indication: atrial fibrillation  No Known Allergies  Patient Measurements: Height: 6\' 4"  (193 cm) Weight: 129.3 kg (285 lb) IBW/kg (Calculated) : 86.8  Vital Signs: Temp: 98.2 F (36.8 C) (09/23 1330) Temp Source: Oral (09/23 1330) BP: 114/63 (09/23 1330) Pulse Rate: 87 (09/23 1330)  Labs: Recent Labs    03/11/21 1215 03/11/21 1629 03/12/21 0451 03/13/21 0431  HGB 10.7* 9.5* 8.9* 8.8*  HCT 35.1* 31.0* 29.0* 28.9*  PLT 319 289 254 262  LABPROT 14.7  --   --   --   INR 1.2  --   --   --   CREATININE 0.90 0.82 0.94 0.90    Estimated Creatinine Clearance: 113.7 mL/min (by C-G formula based on SCr of 0.9 mg/dL).   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   Dry mouth    GERD (gastroesophageal reflux disease)    Hypertension    taking blood pressure medication as needed   Macular degeneration    Bilateral   PONV (postoperative nausea and vomiting)    Tendonitis    Wound healing, delayed    bottom and right heel currently being treated    Medications:  Scheduled:   albuterol  2.5 mg Nebulization BID   atorvastatin  20 mg Oral Q1500   docusate sodium  100 mg Oral BID   LORazepam  1 mg Oral UD   metoprolol tartrate  25 mg Oral BID   pantoprazole  40 mg Oral Daily   senna  1 tablet Oral BID   vitamin B-12  1,000 mcg Oral Daily    Assessment: 70 yo male who  developed tachycardia and subsequent ventricular bigeminy when being transferred to OR for debridement.  Cardiology following and recommend initiating therapeutic Lovenox until surgery.  Afterwards, plan for Eliquis 5mg  BID. At this time, surgery date is not planned.  Patient has been on prophylactic Lovenox at 60mg  [place] QD (0.5mg /kg).  Last Lovenox dose 9/23 at 1459. Hemoglobin with steady downtrend after TKD in June 2022 and now currently between 8-9s.  Platelets stable, wnl.   Goal of Therapy:  Anti-Xa level 0.6-1 units/ml 4hrs after LMWH dose given at steady state Monitor platelets by anticoagulation protocol: Yes   Plan:  Received Lovenox 60mg  [place] at 8119 Give additional Lovenox 70mg  [place] now to equal full treatment dose Start Lovenox 1mg /kg (130 mg [place]) 9/24 at 0500 F/u surgery plans and ability to switch to Eliquis  Monitor Anti-Xa levels as indicated, CBC, s/sx bleeding  Dimple Nanas, PharmD 03/13/2021 4:02 PM

## 2021-03-14 DIAGNOSIS — I96 Gangrene, not elsewhere classified: Secondary | ICD-10-CM | POA: Diagnosis not present

## 2021-03-14 DIAGNOSIS — I9789 Other postprocedural complications and disorders of the circulatory system, not elsewhere classified: Secondary | ICD-10-CM | POA: Diagnosis not present

## 2021-03-14 LAB — BASIC METABOLIC PANEL
Anion gap: 7 (ref 5–15)
BUN: 10 mg/dL (ref 8–23)
CO2: 23 mmol/L (ref 22–32)
Calcium: 8.6 mg/dL — ABNORMAL LOW (ref 8.9–10.3)
Chloride: 110 mmol/L (ref 98–111)
Creatinine, Ser: 0.87 mg/dL (ref 0.61–1.24)
GFR, Estimated: >60 mL/min (ref >60)
Glucose, Bld: 94 mg/dL (ref 70–99)
Potassium: 3.9 mmol/L (ref 3.5–5.1)
Sodium: 140 mmol/L (ref 135–145)

## 2021-03-14 LAB — CBC
HCT: 28.8 % — ABNORMAL LOW (ref 39.0–52.0)
Hemoglobin: 9 g/dL — ABNORMAL LOW (ref 13.0–17.0)
MCH: 28.1 pg (ref 26.0–34.0)
MCHC: 31.3 g/dL (ref 30.0–36.0)
MCV: 90 fL (ref 80.0–100.0)
Platelets: 262 10*3/uL (ref 150–400)
RBC: 3.2 MIL/uL — ABNORMAL LOW (ref 4.22–5.81)
RDW: 15.8 % — ABNORMAL HIGH (ref 11.5–15.5)
WBC: 6.8 10*3/uL (ref 4.0–10.5)
nRBC: 0 % (ref 0.0–0.2)

## 2021-03-14 LAB — MAGNESIUM: Magnesium: 2 mg/dL (ref 1.7–2.4)

## 2021-03-14 NOTE — Progress Notes (Signed)
Nurse was turning patient to clean patient and to change linens and noticed 2 red, painful areas located on the patient's posterior side of the right leg close and under the patient's leg brace. One area was 3 cm X 1 cm and the other was 2 cm X 1 cm. Nurse applied foam dressing over the two areas for protection. Passed this information on to the day shift nurse.

## 2021-03-14 NOTE — Plan of Care (Signed)

## 2021-03-14 NOTE — Plan of Care (Signed)
  Problem: Education: Goal: Knowledge of General Education information will improve Description: Including pain rating scale, medication(s)/side effects and non-pharmacologic comfort measures Outcome: Progressing   Problem: Health Behavior/Discharge Planning: Goal: Ability to manage health-related needs will improve Outcome: Progressing   Problem: Nutrition: Goal: Adequate nutrition will be maintained Outcome: Progressing   Problem: Elimination: Goal: Will not experience complications related to urinary retention Outcome: Progressing   Problem: Pain Managment: Goal: General experience of comfort will improve Outcome: Progressing   Problem: Activity: Goal: Risk for activity intolerance will decrease Outcome: Not Progressing

## 2021-03-14 NOTE — Progress Notes (Signed)
PROGRESS NOTE  Harry [name]  DOB: 24-Nov-1950  PCP: Buzzy Han, MD ULA:[id number]  DOA: 03/11/2021  LOS: 2 days  Hospital Day: 4  Chief complaint: Tachycardia and hypotension postanesthesia  Brief narrative: Harry [name] is a 70 y.o. male with PMH significant for HTN, COPD, past history of smoking, chronic bilateral lower extremity lymphedema who underwent right knee arthroplasty on 01/07/2021 and was recuperating in the rehab.   On 8/1, he suffered an injury to the extensor mechanism during the rehab.  He while attempting to walk off of the stairs, he dislocated his knee and was brought to the ED.  At that time, x-ray showed anterior knee hardware dislocation.  It was successfully reduced.  Next day on 8/2, repeat x-ray showed recurrent dislocation of right knee arthroplasty.  It was successfully reduced again but patient had persistent patella alta. On 8/3 orthopedic surgeon Dr. Lyla Glassing to him to the OR for revision of total knee arthroplasty and reconstruction of the extensor mechanism and MCL. At his immediate postop visit, he was found to have some questionable skin flap viability.  He was treated for a total of 6 weeks in a cylinder cast and had wound checks at regular intervals. He was discharged from rehab to home 6 days ago.   On follow-up on 9/20, his wound was found to be fully demarcated and was therefore indicated for debridement. He was to the hospital today for elective irrigation and debridement of right knee. Patient was intubated for the procedure.  However upon induction of anesthesia, before the procedure had started, patient developed hypotension down to 60s in A. fib with RVR as well.  Anesthesia canceled the surgery. Patient was in the critical care and cardiology. Admitted to hospital service. See below for details  Subjective: Patient was seen and examined this morning.  Propped up in bed.  Not in distress.  No new symptoms.  Underwent  cardiac MRI yesterday.  No significant findings.  Orthopedics planning for procedure next week.  Assessment/Plan: Tachycardia and hypotension postanesthesia. -Per report, patient had tachycardia to 140s after anesthesia induction and before the procedure was started.  It lasted for few minutes and his heart rate and blood pressure improved by the time he was brought to the PACU.   -Rhythm strips obtained during the event were reviewed.  A flutter 2-1 per cardiology.  He has been started on metoprolol.  Currently on full dose Lovenox.  Not a plan of anticoagulation with Eliquis after the surgery. Recent Labs  Lab 03/11/21 1215 03/11/21 1629 03/12/21 0451 03/13/21 0431 03/14/21 0444  K 3.8 4.0 4.0 3.8 3.9  MG  --  1.8  --  2.2 2.0    Hypotension -Per report, he was hypotensive down to 60s after anesthesia induction. -He has history of hypertension and is on lisinopril as needed.  Currently blood pressure stable. -Continue to monitor.  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  Cardiac MRI nonconclusive.   Chronic bilateral lower extremity lymphedema -Denies a known history of CHF.  Not on any diuretics at home.   -Echocardiogram 1/22 showed EF of 60 to 65%, no regional wall motion abnormality, mild LVH, RV size and function normal.   -Ultrasound duplex lower extremities and CTPA negative for DVT PE -Bilateral lower extremity edema might have been secondary to immobilization.     History of COPD -Stopped smoking 14 years ago.   Hyperlipidemia -Continue statin.  Keep aspirin on hold.    Component Value Date/Time   CHOL  80 03/13/2021 0431   TRIG 58 03/13/2021 0431   HDL 29 (L) 03/13/2021 0431   CHOLHDL 2.8 03/13/2021 0431   VLDL 12 03/13/2021 0431   LDLCALC 39 03/13/2021 0431    Anemia -Patient had normal hemoglobin over 12 till the knee arthroplasty June 2022.  Since then his hemoglobin level has been running less than 9 mostly.  No evidence of active bleeding.  Hemoglobin  currently between 8 and 9.  Ferritin level appropriate at 66, vitamin B12 level at the lower limit of normal.  We will start oral replacement. Recent Labs    03/11/21 1215 03/11/21 1629 03/12/21 0451 03/13/21 0431 03/14/21 0444  HGB 10.7* 9.5* 8.9* 8.8* 9.0*  MCV 90.9 90.6 90.3 90.6 90.0  VITAMINB12  --   --   --  274  --   FOLATE  --   --   --  12.8  --   FERRITIN  --   --   --  66  --   TIBC  --   --   --  182*  --   IRON  --   --   --  25*  --   RETICCTPCT  --   --   --  2.5  --    Right knee arthroplasty, dislocation, patella alta -Orthopedics following.  Previously planned procedure for 9/21 has been postponed for next week  Mobility: PT eval postprocedure Code Status:   Code Status: Full Code  Nutritional status: Body mass index is 34.69 kg/m.     Diet:  Diet Order             Diet Heart Room service appropriate? Yes; Fluid consistency: Thin  Diet effective now                  DVT prophylaxis: Lovenox subcu   Antimicrobials: None Fluid: We will stop IV fluid today Consultants: Cardiology, orthopedics Family Communication: None at bedside  Status is: Inpatient  Remains inpatient appropriate because: Pending orthopedic surgery next week.  Dispo: The patient is from: Home              Anticipated d/c is to: Home versus rehab              Patient currently is not medically stable to d/c.   Difficult to place patient No     Infusions:   lactated ringers 10 mL/hr at 03/11/21 1501    Scheduled Meds:  albuterol  2.5 mg Nebulization BID   atorvastatin  20 mg Oral Q1500   docusate sodium  100 mg Oral BID   enoxaparin (LOVENOX) injection  130 mg Subcutaneous Q12H   LORazepam  1 mg Oral UD   metoprolol tartrate  25 mg Oral BID   pantoprazole  40 mg Oral Daily   senna  1 tablet Oral BID   vitamin B-12  1,000 mcg Oral Daily    Antimicrobials: Anti-infectives (From admission, onward)    Start     Dose/Rate Route Frequency Ordered Stop   03/11/21  1215  ceFAZolin (ANCEF) IVPB 2g/100 mL premix        2 g 200 mL/hr over 30 Minutes Intravenous On call to O.R. 03/11/21 1201 03/12/21 0559   03/11/21 1215  vancomycin (VANCOREADY) IVPB 1500 mg/300 mL        1,500 mg 150 mL/hr over 120 Minutes Intravenous On call to O.R. 03/11/21 1201 03/11/21 1535       PRN meds: acetaminophen **OR** acetaminophen, hydrALAZINE,  LORazepam, oxyCODONE   Objective: Vitals:   03/14/21 1041 03/14/21 1254  BP: (!) 102/58 114/72  Pulse: 85 81  Resp: 18 20  Temp: (!) 97.3 F (36.3 C) 97.6 F (36.4 C)  SpO2: 96% 95%    Intake/Output Summary (Last 24 hours) at 03/14/2021 1424 Last data filed at 03/14/2021 1143 Gross per 24 hour  Intake 1537.77 ml  Output 1500 ml  Net 37.77 ml   Filed Weights   03/10/21 1510  Weight: 129.3 kg   Weight change:  Body mass index is 34.69 kg/m.   Physical Exam: General exam: Pleasant, elderly Caucasian male.  Not in distress.  No new symptoms Skin: No rashes, lesions or ulcers. HEENT: Atraumatic, normocephalic, no obvious bleeding Lungs: Clear to auscultation bilaterally CVS: Regular rate and rhythm, no murmur GI/Abd soft, nontender, nondistended, bowel sound present CNS: Alert, awake, oriented x3 Psychiatry: Mood appropriate Extremities: Continues to have pedal edema 1+ bilaterally, right more than left.  Right knee immobilizer in place.  Data Review: I have personally reviewed the laboratory data and studies available.  Recent Labs  Lab 03/11/21 1215 03/11/21 1629 03/12/21 0451 03/13/21 0431 03/14/21 0444  WBC 9.6 8.6 7.8 7.1 6.8  HGB 10.7* 9.5* 8.9* 8.8* 9.0*  HCT 35.1* 31.0* 29.0* 28.9* 28.8*  MCV 90.9 90.6 90.3 90.6 90.0  PLT 319 289 254 262 262   Recent Labs  Lab 03/11/21 1215 03/11/21 1629 03/12/21 0451 03/13/21 0431 03/14/21 0444  NA 137 137 140 136 140  K 3.8 4.0 4.0 3.8 3.9  CL 105 106 109 103 110  CO2 22 25 26 24 23   GLUCOSE 104* 96 97 97 94  BUN 13 12 12 9 10   CREATININE 0.90  0.82 0.94 0.90 0.87  CALCIUM 8.9 8.5* 8.7* 8.1* 8.6*  MG  --  1.8  --  2.2 2.0    F/u labs ordered Unresulted Labs (From admission, onward)     Start     Ordered   03/15/21 0500  CBC  Tomorrow morning,   R        03/14/21 1123   03/11/21 1202  Urinalysis, Routine w reflex microscopic  Once,   R        03/11/21 1201            Signed, Terrilee Croak, MD [facility] 03/14/2021

## 2021-03-14 NOTE — Progress Notes (Signed)
Patient refused nighttime scheduled Senna and Colace.

## 2021-03-15 DIAGNOSIS — I9789 Other postprocedural complications and disorders of the circulatory system, not elsewhere classified: Secondary | ICD-10-CM | POA: Diagnosis not present

## 2021-03-15 DIAGNOSIS — I96 Gangrene, not elsewhere classified: Secondary | ICD-10-CM | POA: Diagnosis not present

## 2021-03-15 LAB — CBC
HCT: 30.9 % — ABNORMAL LOW (ref 39.0–52.0)
Hemoglobin: 9.7 g/dL — ABNORMAL LOW (ref 13.0–17.0)
MCH: 28 pg (ref 26.0–34.0)
MCHC: 31.4 g/dL (ref 30.0–36.0)
MCV: 89.3 fL (ref 80.0–100.0)
Platelets: 269 10*3/uL (ref 150–400)
RBC: 3.46 MIL/uL — ABNORMAL LOW (ref 4.22–5.81)
RDW: 15.9 % — ABNORMAL HIGH (ref 11.5–15.5)
WBC: 7.5 10*3/uL (ref 4.0–10.5)
nRBC: 0 % (ref 0.0–0.2)

## 2021-03-15 MED ORDER — ALBUTEROL SULFATE (2.5 MG/3ML) 0.083% IN NEBU: 2.5000 mg | INHALATION_SOLUTION | RESPIRATORY_TRACT | Status: DC | PRN

## 2021-03-15 MED ORDER — ZOLPIDEM TARTRATE 5 MG PO TABS
5.0000 mg | ORAL_TABLET | Freq: Every evening | ORAL | Status: DC | PRN
  Administered 2021-03-15: 5 mg via ORAL
  Filled 2021-03-15: qty 1

## 2021-03-15 NOTE — Progress Notes (Signed)
PROGRESS NOTE  Harry [name]  DOB: March 09, 1951  PCP: Buzzy Han, MD UQJ:[id number]  DOA: 03/11/2021  LOS: 3 days  Hospital Day: 5  Chief complaint: Tachycardia and hypotension postanesthesia  Brief narrative: Harry [name] is a 70 y.o. male with PMH significant for HTN, COPD, past history of smoking, chronic bilateral lower extremity lymphedema who underwent right knee arthroplasty on 01/07/2021 and was recuperating in the rehab.   On 8/1, he suffered an injury to the extensor mechanism during the rehab.  He while attempting to walk off of the stairs, he dislocated his knee and was brought to the ED.  At that time, x-ray showed anterior knee hardware dislocation.  It was successfully reduced.  Next day on 8/2, repeat x-ray showed recurrent dislocation of right knee arthroplasty.  It was successfully reduced again but patient had persistent patella alta. On 8/3 orthopedic surgeon Dr. Lyla Glassing to him to the OR for revision of total knee arthroplasty and reconstruction of the extensor mechanism and MCL. At his immediate postop visit, he was found to have some questionable skin flap viability.  He was treated for a total of 6 weeks in a cylinder cast and had wound checks at regular intervals. He was discharged from rehab to home 6 days ago.   On follow-up on 9/20, his wound was found to be fully demarcated and was therefore indicated for debridement. He was to the hospital today for elective irrigation and debridement of right knee. Patient was intubated for the procedure.  However upon induction of anesthesia, before the procedure had started, patient developed hypotension down to 60s in A. fib with RVR as well.  Anesthesia canceled the surgery. Patient was in the critical care and cardiology. Admitted to hospital service. See below for details  Subjective: Patient was seen and examined this morning.  Lying on bed.  Not in distress.  No new symptoms.  Pending orthopedic  procedure early next week. Wife at bedside.  Assessment/Plan: Tachycardia and hypotension postanesthesia. -Per report, patient had tachycardia to 140s after anesthesia induction and before the procedure was started.  It lasted for few minutes and his heart rate and blood pressure improved by the time he was brought to the PACU.   -Rhythm strips obtained during the event were reviewed.  A flutter 2-1 per cardiology.  He has been started on metoprolol.  Currently on full dose Lovenox.  Not a plan of anticoagulation with Eliquis after the surgery. Recent Labs  Lab 03/11/21 1215 03/11/21 1629 03/12/21 0451 03/13/21 0431 03/14/21 0444  K 3.8 4.0 4.0 3.8 3.9  MG  --  1.8  --  2.2 2.0     Hypotension -Per report, he was hypotensive down to 60s after anesthesia induction. -He has history of hypertension and is on lisinopril as needed.  Currently blood pressure stable. -Continue to monitor.  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  Cardiac MRI nonconclusive.   Chronic bilateral lower extremity lymphedema -Denies a known history of CHF.  Not on any diuretics at home.   -Echocardiogram 1/22 showed EF of 60 to 65%, no regional wall motion abnormality, mild LVH, RV size and function normal.   -Ultrasound duplex lower extremities and CTPA negative for DVT PE -Bilateral lower extremity edema might have been secondary to immobilization.  On long run, he may benefit from diuretics at home.   History of COPD -Stopped smoking 14 years ago.   Hyperlipidemia -Continue statin.  Keep aspirin on hold.  Anemia Vitamin B12 deficiency. -Patient  had normal hemoglobin over 12 till the knee arthroplasty June 2022.  Since then his hemoglobin level has been running less than 9 mostly.  No evidence of active bleeding.  Hemoglobin currently between between 9 and 10.  Ferritin level appropriate at 66, vitamin B12 level at the lower limit of normal.  Oral vitamin B12 has been started. Recent Labs     03/11/21 1629 03/12/21 0451 03/13/21 0431 03/14/21 0444 03/15/21 0428  HGB 9.5* 8.9* 8.8* 9.0* 9.7*  MCV 90.6 90.3 90.6 90.0 89.3  VITAMINB12  --   --  274  --   --   FOLATE  --   --  12.8  --   --   FERRITIN  --   --  66  --   --   TIBC  --   --  182*  --   --   IRON  --   --  25*  --   --   RETICCTPCT  --   --  2.5  --   --     Right knee arthroplasty, dislocation, patella alta -Orthopedics following.  Previously planned procedure for 9/21 has been postponed for next week  Mobility: PT eval postprocedure Code Status:   Code Status: Full Code  Nutritional status: Body mass index is 34.69 kg/m.     Diet:  Diet Order             Diet Heart Room service appropriate? Yes; Fluid consistency: Thin  Diet effective now                  DVT prophylaxis: Lovenox subcu   Antimicrobials: None Fluid: Not on IV fluid Consultants: Cardiology, orthopedics Family Communication: None at bedside  Status is: Inpatient  Remains inpatient appropriate because: Pending orthopedic surgery next week.  Dispo: The patient is from: Home              Anticipated d/c is to: Home versus rehab              Patient currently is not medically stable to d/c.   Difficult to place patient No     Infusions:   lactated ringers 10 mL/hr at 03/15/21 0522    Scheduled Meds:  atorvastatin  20 mg Oral Q1500   docusate sodium  100 mg Oral BID   enoxaparin (LOVENOX) injection  130 mg Subcutaneous Q12H   metoprolol tartrate  25 mg Oral BID   pantoprazole  40 mg Oral Daily   senna  1 tablet Oral BID   vitamin B-12  1,000 mcg Oral Daily    Antimicrobials: Anti-infectives (From admission, onward)    Start     Dose/Rate Route Frequency Ordered Stop   03/11/21 1215  ceFAZolin (ANCEF) IVPB 2g/100 mL premix        2 g 200 mL/hr over 30 Minutes Intravenous On call to O.R. 03/11/21 1201 03/12/21 0559   03/11/21 1215  vancomycin (VANCOREADY) IVPB 1500 mg/300 mL        1,500 mg 150 mL/hr over  120 Minutes Intravenous On call to O.R. 03/11/21 1201 03/11/21 1535       PRN meds: acetaminophen **OR** acetaminophen, albuterol, hydrALAZINE, oxyCODONE   Objective: Vitals:   03/15/21 0450 03/15/21 0925  BP: 109/83 116/76  Pulse: 72 63  Resp: 20   Temp: (!) 97.5 F (36.4 C)   SpO2: 94%     Intake/Output Summary (Last 24 hours) at 03/15/2021 1122 Last data filed at 03/15/2021 0800 Gross  per 24 hour  Intake 512.11 ml  Output 2201 ml  Net -1688.89 ml    Filed Weights   03/10/21 1510  Weight: 129.3 kg   Weight change:  Body mass index is 34.69 kg/m.   Physical Exam: General exam: Pleasant, elderly Caucasian male.  Not in distress.  No new symptoms. Skin: No rashes, lesions or ulcers. HEENT: Atraumatic, normocephalic, no obvious bleeding Lungs: Clear to auscultation bilaterally CVS: Regular rate and rhythm, no murmur GI/Abd soft, nontender, nondistended, bowel sound present CNS: Alert, awake, oriented x3 Psychiatry: Mood appropriate Extremities: Bilateral 1+ pedal edema, right more than left.  Seems to be somewhat better today.  Right knee immobilizer in place.  Data Review: I have personally reviewed the laboratory data and studies available.  Recent Labs  Lab 03/11/21 1629 03/12/21 0451 03/13/21 0431 03/14/21 0444 03/15/21 0428  WBC 8.6 7.8 7.1 6.8 7.5  HGB 9.5* 8.9* 8.8* 9.0* 9.7*  HCT 31.0* 29.0* 28.9* 28.8* 30.9*  MCV 90.6 90.3 90.6 90.0 89.3  PLT 289 254 262 262 269    Recent Labs  Lab 03/11/21 1215 03/11/21 1629 03/12/21 0451 03/13/21 0431 03/14/21 0444  NA 137 137 140 136 140  K 3.8 4.0 4.0 3.8 3.9  CL 105 106 109 103 110  CO2 22 25 26 24 23   GLUCOSE 104* 96 97 97 94  BUN 13 12 12 9 10   CREATININE 0.90 0.82 0.94 0.90 0.87  CALCIUM 8.9 8.5* 8.7* 8.1* 8.6*  MG  --  1.8  --  2.2 2.0     F/u labs ordered Unresulted Labs (From admission, onward)     Start     Ordered   03/11/21 1202  Urinalysis, Routine w reflex microscopic  Once,   R         03/11/21 1201            Signed, Terrilee Croak, MD [facility] 03/15/2021

## 2021-03-15 NOTE — Progress Notes (Signed)
Pt  found in  the room pulling lines, disoriented and trying to get out of bed. This RN successfully redirected and reoriented pt but he still acting somnolently. Pt mentioned that he get this kind of reaction when taking oxycodone or any strong medications. RN has only given pt Ambien(per pt request) before this incident happened and he suspects that it has caused him to be confused. He also mentioned that he has never took Ambien before. MD Damita Dunnings made aware. Vital signs are stable. Will continue to monitor.

## 2021-03-16 ENCOUNTER — Other Ambulatory Visit (HOSPITAL_COMMUNITY): Payer: Self-pay

## 2021-03-16 DIAGNOSIS — I96 Gangrene, not elsewhere classified: Secondary | ICD-10-CM | POA: Diagnosis not present

## 2021-03-16 DIAGNOSIS — I9789 Other postprocedural complications and disorders of the circulatory system, not elsewhere classified: Secondary | ICD-10-CM | POA: Diagnosis not present

## 2021-03-16 MED ORDER — MELATONIN 3 MG PO TABS: 3.0000 mg | ORAL_TABLET | Freq: Every day | ORAL | Status: DC

## 2021-03-16 MED ORDER — SODIUM CHLORIDE 0.9 % IV SOLN
2.0000 g | INTRAVENOUS | Status: DC
  Administered 2021-03-16 – 2021-03-18 (×3): 2 g via INTRAVENOUS
  Filled 2021-03-16 (×3): qty 20

## 2021-03-16 MED ORDER — MELATONIN 3 MG PO TABS
3.0000 mg | ORAL_TABLET | Freq: Every evening | ORAL | Status: DC | PRN
  Administered 2021-03-19 – 2021-03-20 (×2): 3 mg via ORAL
  Filled 2021-03-16 (×2): qty 1

## 2021-03-16 NOTE — Care Management Important Message (Signed)
Important Message  Patient Details IM Letter given to the Patient. Name: Harry [name] MRN: [id number] Date of Birth: 03/04/1951   Medicare Important Message Given:  Yes     [name] 03/16/2021, 1:43 PM

## 2021-03-16 NOTE — Progress Notes (Signed)
ANTICOAGULATION CONSULT NOTE - Initial Consult  Pharmacy Consult for enoxaparin Indication: atrial fibrillation  No Known Allergies  Patient Measurements: Height: 6\' 4"  (193 cm) Weight: 129.3 kg (285 lb) IBW/kg (Calculated) : 86.8  Vital Signs: Temp: 98 F (36.7 C) (09/26 0515) Temp Source: Oral (09/26 0515) BP: 104/91 (09/26 0515) Pulse Rate: 95 (09/26 0515)  Labs: Recent Labs    03/14/21 0444 03/15/21 0428  HGB 9.0* 9.7*  HCT 28.8* 30.9*  PLT 262 269  CREATININE 0.87  --      Estimated Creatinine Clearance: 117.7 mL/min (by C-G formula based on SCr of 0.87 mg/dL).   Medical History: Past Medical History:  Diagnosis Date   Arthritis    Cancer ([place])    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   Dry mouth    GERD (gastroesophageal reflux disease)    Hypertension    taking blood pressure medication as needed   Macular degeneration    Bilateral   PONV (postoperative nausea and vomiting)    Tendonitis    Wound healing, delayed    bottom and right heel currently being treated    Medications:  Scheduled:   atorvastatin  20 mg Oral Q1500   docusate sodium  100 mg Oral BID   enoxaparin (LOVENOX) injection  130 mg Subcutaneous Q12H   metoprolol tartrate  25 mg Oral BID   pantoprazole  40 mg Oral Daily   senna  1 tablet Oral BID   vitamin B-12  1,000 mcg Oral Daily    Assessment: 70 yo male who  developed tachycardia and subsequent ventricular bigeminy when being transferred to OR for debridement.  Cardiology following and recommend initiating therapeutic Lovenox until surgery.  Afterwards, plan for Eliquis 5mg  BID. At this time, surgery date is not planned.  Today, 03/16/2021 Hgb low but improved 9.7, Plts wnl SCr wnl (9/24) No bleeding reported Still waiting on time of surgery  Goal of Therapy:  Anti-Xa level 0.6-1 units/ml 4hrs after LMWH dose given at steady state Monitor platelets by anticoagulation protocol: Yes   Plan:  Continue Lovenox 1mg /kg (130 mg [place])  q12h F/u surgery plans and ability to switch to Eliquis  Monitor Anti-Xa levels as indicated, CBC, s/sx bleeding  Peggyann Juba, PharmD, BCPS Pharmacy: [phone] 03/16/2021 8:42 AM

## 2021-03-16 NOTE — Progress Notes (Signed)
Surgery scheduled for Wednesday. NPO after MN tues night. Hold Lovenox after am dose on Tuesday.

## 2021-03-16 NOTE — Progress Notes (Signed)
PROGRESS NOTE  Harry [name]  DOB: Nov 25, 1950  PCP: Buzzy Han, MD WJX:[id number]  DOA: 03/11/2021  LOS: 3 days  Hospital Day: 6  Chief complaint: Tachycardia and hypotension postanesthesia  Brief narrative: Harry [name] is a 69 y.o. male with PMH significant for HTN, COPD, past history of smoking, chronic bilateral lower extremity lymphedema who underwent right knee arthroplasty on 01/07/2021 and was recuperating in the rehab.   On 8/1, he suffered an injury to the extensor mechanism during the rehab.  He while attempting to walk off of the stairs, he dislocated his knee and was brought to the ED.  At that time, x-ray showed anterior knee hardware dislocation.  It was successfully reduced.  Next day on 8/2, repeat x-ray showed recurrent dislocation of right knee arthroplasty.  It was successfully reduced again but patient had persistent patella alta. On 8/3 orthopedic surgeon Dr. Lyla Glassing to him to the OR for revision of total knee arthroplasty and reconstruction of the extensor mechanism and MCL. At his immediate postop visit, he was found to have some questionable skin flap viability.  He was treated for a total of 6 weeks in a cylinder cast and had wound checks at regular intervals. He was discharged from rehab to home 6 days ago.   On follow-up on 9/20, his wound was found to be fully demarcated and was therefore indicated for debridement. He was to the hospital today for elective irrigation and debridement of right knee. Patient was intubated for the procedure.  However upon induction of anesthesia, before the procedure had started, patient developed hypotension down to 60s in A. fib with RVR as well.  Anesthesia canceled the surgery. Patient was in the critical care and cardiology. Admitted to hospital service. See below for details  Subjective: Patient was seen and examined this morning.  Lying on bed.  Not in distress no new symptoms. Per report, last night,  patient asked for a dose of Ambien after which he started being confused and acting differently. This morning, patient is alert, awake, oriented x3 and is able to recall event from last time.  He states that in the past, when he was tried on tramadol, Norco and Percocet, he had similar symptoms.  Assessment/Plan: Tachycardia and hypotension postanesthesia. -Per report, patient had tachycardia to 140s after anesthesia induction and before the procedure was started.  It lasted for few minutes and his heart rate and blood pressure improved by the time he was brought to the PACU.   -Rhythm strips obtained during the event were reviewed.  A flutter 2-1 per cardiology.  He has been started on metoprolol.  Currently on full dose Lovenox.  Not a plan of anticoagulation with Eliquis after the surgery. Recent Labs  Lab 03/11/21 1215 03/11/21 1629 03/12/21 0451 03/13/21 0431 03/14/21 0444  K 3.8 4.0 4.0 3.8 3.9  MG  --  1.8  --  2.2 2.0     Hypotension -Per report, he was hypotensive down to 60s after anesthesia induction. -He has history of hypertension and is on lisinopril as needed.  Currently blood pressure stable. -Continue to monitor.  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  Cardiac MRI nonconclusive.   Chronic bilateral lower extremity lymphedema -Denies a known history of CHF.  Not on any diuretics at home.   -Echocardiogram 1/22 showed EF of 60 to 65%, no regional wall motion abnormality, mild LVH, RV size and function normal.   -Ultrasound duplex lower extremities and CTPA negative for DVT PE -Bilateral  lower extremity edema might have been secondary to immobilization.  Pedal edema persists.  On long run, he may benefit from diuretics at home.   History of COPD -Stopped smoking 14 years ago.   Hyperlipidemia -Continue statin.  Keep aspirin on hold.  Anemia Vitamin B12 deficiency. -Patient had normal hemoglobin over 12 till the knee arthroplasty June 2022.  Since then his  hemoglobin level has been running less than 9 mostly.  No evidence of active bleeding.  Hemoglobin currently between between 9 and 10.  Ferritin level appropriate at 66, vitamin B12 level at the lower limit of normal.  Oral vitamin B12 has been started. Recent Labs    03/11/21 1629 03/12/21 0451 03/13/21 0431 03/14/21 0444 03/15/21 0428  HGB 9.5* 8.9* 8.8* 9.0* 9.7*  MCV 90.6 90.3 90.6 90.0 89.3  VITAMINB12  --   --  274  --   --   FOLATE  --   --  12.8  --   --   FERRITIN  --   --  66  --   --   TIBC  --   --  182*  --   --   IRON  --   --  25*  --   --   RETICCTPCT  --   --  2.5  --   --     Right knee arthroplasty, dislocation, patella alta -Orthopedics following.  Previously planned procedure for 9/21 was postponed.  Defer to orthopedics for the exact date.  Insomnia -Patient had side effect with Ambien last night.  We will try melatonin as needed tonight.  Mobility: PT eval postprocedure Code Status:   Code Status: Full Code  Nutritional status: Body mass index is 34.69 kg/m.     Diet:  Diet Order             Diet Heart Room service appropriate? Yes; Fluid consistency: Thin  Diet effective now                  DVT prophylaxis: Lovenox subcu   Antimicrobials: None Fluid: Not on IV fluid Consultants: Cardiology, orthopedics Family Communication: None at bedside  Status is: Inpatient  Remains inpatient appropriate because: Pending orthopedic surgery sometimes this week  Dispo: The patient is from: Home              Anticipated d/c is to: Home versus rehab              Patient currently is not medically stable to d/c.   Difficult to place patient No     Infusions:   cefTRIAXone (ROCEPHIN)  IV 2 g (03/16/21 1255)   lactated ringers 10 mL/hr at 03/15/21 1334    Scheduled Meds:  atorvastatin  20 mg Oral Q1500   docusate sodium  100 mg Oral BID   enoxaparin (LOVENOX) injection  130 mg Subcutaneous Q12H   metoprolol tartrate  25 mg Oral BID    pantoprazole  40 mg Oral Daily   senna  1 tablet Oral BID   vitamin B-12  1,000 mcg Oral Daily    Antimicrobials: Anti-infectives (From admission, onward)    Start     Dose/Rate Route Frequency Ordered Stop   03/16/21 1000  cefTRIAXone (ROCEPHIN) 2 g in sodium chloride 0.9 % 100 mL IVPB        2 g 200 mL/hr over 30 Minutes Intravenous Every 24 hours 03/16/21 0858     03/11/21 1215  ceFAZolin (ANCEF) IVPB 2g/100 mL premix  2 g 200 mL/hr over 30 Minutes Intravenous On call to O.R. 03/11/21 1201 03/12/21 0559   03/11/21 1215  vancomycin (VANCOREADY) IVPB 1500 mg/300 mL        1,500 mg 150 mL/hr over 120 Minutes Intravenous On call to O.R. 03/11/21 1201 03/11/21 1535       PRN meds: acetaminophen **OR** acetaminophen, albuterol, hydrALAZINE, melatonin   Objective: Vitals:   03/16/21 0515 03/16/21 1224  BP: (!) 104/91 119/68  Pulse: 95 (!) 43  Resp: 20 18  Temp: 98 F (36.7 C) 98.1 F (36.7 C)  SpO2: 95% 98%    Intake/Output Summary (Last 24 hours) at 03/16/2021 1317 Last data filed at 03/16/2021 0832 Gross per 24 hour  Intake 863.21 ml  Output 1050 ml  Net -186.79 ml    Filed Weights   03/10/21 1510  Weight: 129.3 kg   Weight change:  Body mass index is 34.69 kg/m.   Physical Exam: General exam: Pleasant, elderly Caucasian male.  Not in distress.  Skin: No rashes, lesions or ulcers. HEENT: Atraumatic, normocephalic, no obvious bleeding Lungs: Clear to auscultation bilaterally CVS: Regular rate and rhythm, no murmur GI/Abd soft, nontender, nondistended, bowel sound present CNS: Alert, awake, oriented x3 Psychiatry: Mood appropriate Extremities: Improving bilateral 1+ pedal edema, right more than left. Right knee immobilizer in place.  Data Review: I have personally reviewed the laboratory data and studies available.  Recent Labs  Lab 03/11/21 1629 03/12/21 0451 03/13/21 0431 03/14/21 0444 03/15/21 0428  WBC 8.6 7.8 7.1 6.8 7.5  HGB 9.5* 8.9*  8.8* 9.0* 9.7*  HCT 31.0* 29.0* 28.9* 28.8* 30.9*  MCV 90.6 90.3 90.6 90.0 89.3  PLT 289 254 262 262 269    Recent Labs  Lab 03/11/21 1215 03/11/21 1629 03/12/21 0451 03/13/21 0431 03/14/21 0444  NA 137 137 140 136 140  K 3.8 4.0 4.0 3.8 3.9  CL 105 106 109 103 110  CO2 22 25 26 24 23   GLUCOSE 104* 96 97 97 94  BUN 13 12 12 9 10   CREATININE 0.90 0.82 0.94 0.90 0.87  CALCIUM 8.9 8.5* 8.7* 8.1* 8.6*  MG  --  1.8  --  2.2 2.0     F/u labs ordered Unresulted Labs (From admission, onward)     Start     Ordered   03/11/21 1202  Urinalysis, Routine w reflex microscopic  Once,   R        03/11/21 1201            Signed, Terrilee Croak, MD [facility] 03/16/2021

## 2021-03-16 NOTE — Plan of Care (Signed)

## 2021-03-17 DIAGNOSIS — I9789 Other postprocedural complications and disorders of the circulatory system, not elsewhere classified: Secondary | ICD-10-CM | POA: Diagnosis not present

## 2021-03-17 DIAGNOSIS — I96 Gangrene, not elsewhere classified: Secondary | ICD-10-CM | POA: Diagnosis not present

## 2021-03-17 LAB — SURGICAL PCR SCREEN
MRSA, PCR: NEGATIVE
Staphylococcus aureus: NEGATIVE

## 2021-03-17 MED ORDER — POVIDONE-IODINE 10 % EX SWAB: 2.0000 "application " | Freq: Once | CUTANEOUS | Status: DC

## 2021-03-17 MED ORDER — VANCOMYCIN HCL 1500 MG/300ML IV SOLN
1500.0000 mg | INTRAVENOUS | Status: AC
  Administered 2021-03-18: 1500 mg via INTRAVENOUS
  Filled 2021-03-17: qty 300

## 2021-03-17 MED ORDER — CHLORHEXIDINE GLUCONATE 4 % EX LIQD
60.0000 mL | Freq: Once | CUTANEOUS | Status: AC
  Administered 2021-03-18: 4 via TOPICAL
  Filled 2021-03-17 (×2): qty 60

## 2021-03-17 NOTE — Progress Notes (Signed)
PROGRESS NOTE  Harry [name]  DOB: 10-26-1950  PCP: Buzzy Han, MD YNW:[id number]  DOA: 03/11/2021  LOS: 4 days  Hospital Day: 7  Chief complaint: Tachycardia and hypotension postanesthesia  Brief narrative: Harry [name] is a 70 y.o. male with PMH significant for HTN, COPD, past history of smoking, chronic bilateral lower extremity lymphedema who underwent right knee arthroplasty on 01/07/2021 and was recuperating in the rehab.   On 8/1, he suffered an injury to the extensor mechanism during the rehab.  He while attempting to walk off of the stairs, he dislocated his knee and was brought to the ED.  At that time, x-ray showed anterior knee hardware dislocation.  It was successfully reduced.  Next day on 8/2, repeat x-ray showed recurrent dislocation of right knee arthroplasty.  It was successfully reduced again but patient had persistent patella alta. On 8/3 orthopedic surgeon Dr. Lyla Glassing to him to the OR for revision of total knee arthroplasty and reconstruction of the extensor mechanism and MCL. At his immediate postop visit, he was found to have some questionable skin flap viability.  He was treated for a total of 6 weeks in a cylinder cast and had wound checks at regular intervals. He was discharged from rehab to home 6 days ago.   On follow-up on 9/20, his wound was found to be fully demarcated and was therefore indicated for debridement. He was to the hospital today for elective irrigation and debridement of right knee. Patient was intubated for the procedure.  However upon induction of anesthesia, before the procedure had started, patient developed hypotension down to 60s in A. fib with RVR as well.  Anesthesia canceled the surgery. Patient was seen by critical care and cardiology. Admitted to hospital service. Further cardiology work-up did not reveal anything significant.  Medically clear for orthopedic procedure.  Noted a plan to do tomorrow  9/28.  Subjective: Patient was seen and examined this morning.  Lying on bed.  Not in distress.  No new symptoms.  Unlike the other night, patient did not have episode of confusion last night  Assessment/Plan: A flutter with RVR -Per report, post induction with anesthesia, patient had an episode of tachycardia to 140s.  It lasted for few minutes and his heart rate and blood pressure improved by the time he was brought to the PACU.   -Cardiology consult was obtained.  Started on metoprolol.  Currently on full dose Lovenox.  Not a plan of anticoagulation with Eliquis after the surgery. Recent Labs  Lab 03/11/21 1215 03/11/21 1629 03/12/21 0451 03/13/21 0431 03/14/21 0444  K 3.8 4.0 4.0 3.8 3.9  MG  --  1.8  --  2.2 2.0     Hypotension -Per report, he was hypotensive down to 60s after anesthesia induction. -He has history of hypertension and is on lisinopril as needed.  Currently blood pressure stable. -Continue to monitor.  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  Cardiac MRI nonconclusive.   Chronic bilateral lower extremity lymphedema -Denies a known history of CHF.  Not on any diuretics at home.   -Echocardiogram 1/22 showed EF of 60 to 65%, no regional wall motion abnormality, mild LVH, RV size and function normal.   -Ultrasound duplex lower extremities and CTPA negative for DVT PE -Bilateral lower extremity edema might have been secondary to immobilization.  Pedal edema persists.  On long run, he may benefit from diuretics at home.   History of COPD -Stopped smoking 14 years ago.   Hyperlipidemia -Continue statin.  Keep aspirin on hold.  Anemia Vitamin B12 deficiency. -Patient had normal hemoglobin over 12 till the knee arthroplasty June 2022.  Since then his hemoglobin level has been running less than 9 mostly.  No evidence of active bleeding.  Hemoglobin currently between between 9 and 10.  Ferritin level appropriate at 66, vitamin B12 level at the lower limit of  normal.  Oral vitamin B12 has been started. Recent Labs    03/11/21 1629 03/12/21 0451 03/13/21 0431 03/14/21 0444 03/15/21 0428  HGB 9.5* 8.9* 8.8* 9.0* 9.7*  MCV 90.6 90.3 90.6 90.0 89.3  VITAMINB12  --   --  274  --   --   FOLATE  --   --  12.8  --   --   FERRITIN  --   --  66  --   --   TIBC  --   --  182*  --   --   IRON  --   --  25*  --   --   RETICCTPCT  --   --  2.5  --   --     Right knee arthroplasty, dislocation, patella alta -Orthopedics following.  Previously planned procedure for 9/21 was postponed.  Noted a plan to do tomorrow 9/28.  Insomnia -Melatonin as needed at bedtime.  Mobility: PT eval postprocedure Code Status:   Code Status: Full Code  Nutritional status: Body mass index is 34.69 kg/m.     Diet:  Diet Order             Diet NPO time specified  Diet effective ____           Diet Heart Room service appropriate? Yes; Fluid consistency: Thin  Diet effective now                  DVT prophylaxis: Lovenox subcu   Antimicrobials: None Fluid: Not on IV fluid Consultants: Cardiology, orthopedics Family Communication: None at bedside  Status is: Inpatient  Remains inpatient appropriate because: Pending orthopedic surgery on 9/28  Dispo: The patient is from: Home              Anticipated d/c is to: Home versus rehab              Patient currently is not medically stable to d/c.   Difficult to place patient No     Infusions:   cefTRIAXone (ROCEPHIN)  IV Stopped (03/17/21 1054)   lactated ringers 10 mL/hr at 03/15/21 1334   [START ON 03/18/2021] vancomycin      Scheduled Meds:  atorvastatin  20 mg Oral Q1500   chlorhexidine  60 mL Topical Once   docusate sodium  100 mg Oral BID   metoprolol tartrate  25 mg Oral BID   pantoprazole  40 mg Oral Daily   povidone-iodine  2 application Topical Once   senna  1 tablet Oral BID   vitamin B-12  1,000 mcg Oral Daily    Antimicrobials: Anti-infectives (From admission, onward)    Start      Dose/Rate Route Frequency Ordered Stop   03/18/21 1200  vancomycin (VANCOREADY) IVPB 1500 mg/300 mL        1,500 mg 150 mL/hr over 120 Minutes Intravenous On call to O.R. 03/17/21 0755 03/19/21 1159   03/16/21 1000  cefTRIAXone (ROCEPHIN) 2 g in sodium chloride 0.9 % 100 mL IVPB        2 g 200 mL/hr over 30 Minutes Intravenous Every 24 hours 03/16/21 0858  03/11/21 1215  ceFAZolin (ANCEF) IVPB 2g/100 mL premix        2 g 200 mL/hr over 30 Minutes Intravenous On call to O.R. 03/11/21 1201 03/12/21 0559   03/11/21 1215  vancomycin (VANCOREADY) IVPB 1500 mg/300 mL        1,500 mg 150 mL/hr over 120 Minutes Intravenous On call to O.R. 03/11/21 1201 03/11/21 1535       PRN meds: acetaminophen **OR** acetaminophen, albuterol, hydrALAZINE, melatonin   Objective: Vitals:   03/17/21 1018 03/17/21 1254  BP: (!) 101/55 (!) 101/55  Pulse: 80 80  Resp: 20 18  Temp: 98.1 F (36.7 C) 98.3 F (36.8 C)  SpO2: 97% 93%    Intake/Output Summary (Last 24 hours) at 03/17/2021 1619 Last data filed at 03/17/2021 1400 Gross per 24 hour  Intake 445.1 ml  Output 1425 ml  Net -979.9 ml    Filed Weights   03/10/21 1510  Weight: 129.3 kg   Weight change:  Body mass index is 34.69 kg/m.   Physical Exam: General exam: Pleasant, elderly Caucasian male.  Not in distress Skin: No rashes, lesions or ulcers. HEENT: Atraumatic, normocephalic, no obvious bleeding Lungs: Clear to auscultation bilaterally CVS: Regular rate and rhythm, no murmur GI/Abd soft, nontender, nondistended, bowel sound present CNS: Alert, awake, oriented x3 Psychiatry: Mood appropriate Extremities: Improving bilateral 1+ pedal edema, right more than left. Right knee immobilizer in place.  Data Review: I have personally reviewed the laboratory data and studies available.  Recent Labs  Lab 03/11/21 1629 03/12/21 0451 03/13/21 0431 03/14/21 0444 03/15/21 0428  WBC 8.6 7.8 7.1 6.8 7.5  HGB 9.5* 8.9* 8.8* 9.0*  9.7*  HCT 31.0* 29.0* 28.9* 28.8* 30.9*  MCV 90.6 90.3 90.6 90.0 89.3  PLT 289 254 262 262 269    Recent Labs  Lab 03/11/21 1215 03/11/21 1629 03/12/21 0451 03/13/21 0431 03/14/21 0444  NA 137 137 140 136 140  K 3.8 4.0 4.0 3.8 3.9  CL 105 106 109 103 110  CO2 22 25 26 24 23   GLUCOSE 104* 96 97 97 94  BUN 13 12 12 9 10   CREATININE 0.90 0.82 0.94 0.90 0.87  CALCIUM 8.9 8.5* 8.7* 8.1* 8.6*  MG  --  1.8  --  2.2 2.0     F/u labs ordered Unresulted Labs (From admission, onward)     Start     Ordered   03/11/21 1202  Urinalysis, Routine w reflex microscopic  Once,   R        03/11/21 1201            Signed, Terrilee Croak, MD [facility] 03/17/2021

## 2021-03-17 NOTE — Plan of Care (Signed)
  Problem: Clinical Measurements: Goal: Will remain free from infection Outcome: Progressing Goal: Cardiovascular complication will be avoided Outcome: Progressing   Problem: Nutrition: Goal: Adequate nutrition will be maintained Outcome: Progressing   Problem: Coping: Goal: Level of anxiety will decrease Outcome: Progressing   Problem: Pain Managment: Goal: General experience of comfort will improve Outcome: Progressing

## 2021-03-17 NOTE — Plan of Care (Signed)
Harry [name] reports minimal pain today, declining need for pain meds whenever offered. Rt knee immobilizer in place. Using bedpan for BM. Afebrile. Eager for I&D to be done tomorrow. Consent signed. Pre op MRSA swab sent and negative.  Problem: Education: Goal: Knowledge of General Education information will improve Description: Including pain rating scale, medication(s)/side effects and non-pharmacologic comfort measures Outcome: Progressing   Problem: Health Behavior/Discharge Planning: Goal: Ability to manage health-related needs will improve Outcome: Progressing   Problem: Clinical Measurements: Goal: Ability to maintain clinical measurements within normal limits will improve Outcome: Progressing Goal: Will remain free from infection Outcome: Progressing Note: IV abx, afebrile Goal: Diagnostic test results will improve Outcome: Progressing Goal: Respiratory complications will improve Outcome: Progressing Goal: Cardiovascular complication will be avoided Outcome: Progressing   Problem: Activity: Goal: Risk for activity intolerance will decrease Outcome: Progressing   Problem: Nutrition: Goal: Adequate nutrition will be maintained Outcome: Progressing   Problem: Coping: Goal: Level of anxiety will decrease Outcome: Progressing   Problem: Elimination: Goal: Will not experience complications related to bowel motility Outcome: Progressing Goal: Will not experience complications related to urinary retention Outcome: Progressing   Problem: Pain Managment: Goal: General experience of comfort will improve Outcome: Progressing   Problem: Safety: Goal: Ability to remain free from injury will improve Outcome: Progressing   Problem: Skin Integrity: Goal: Risk for impaired skin integrity will decrease Outcome: Progressing

## 2021-03-18 ENCOUNTER — Inpatient Hospital Stay (HOSPITAL_COMMUNITY): Payer: Medicare Other | Admitting: Anesthesiology

## 2021-03-18 ENCOUNTER — Encounter (HOSPITAL_COMMUNITY)
Admission: RE | Disposition: A | Discharge status: Home Health | Payer: Self-pay | Source: Home / Self Care | Attending: Internal Medicine

## 2021-03-18 DIAGNOSIS — I96 Gangrene, not elsewhere classified: Secondary | ICD-10-CM | POA: Diagnosis not present

## 2021-03-18 DIAGNOSIS — I9789 Other postprocedural complications and disorders of the circulatory system, not elsewhere classified: Secondary | ICD-10-CM | POA: Diagnosis not present

## 2021-03-18 HISTORY — PX: I & D EXTREMITY: SHX5045

## 2021-03-18 SURGERY — IRRIGATION AND DEBRIDEMENT EXTREMITY
Anesthesia: General | Laterality: Right

## 2021-03-18 MED ORDER — DEXAMETHASONE SODIUM PHOSPHATE 10 MG/ML IJ SOLN
INTRAMUSCULAR | Status: AC
  Filled 2021-03-18: qty 1

## 2021-03-18 MED ORDER — METOCLOPRAMIDE HCL 5 MG/ML IJ SOLN: 5.0000 mg | Freq: Three times a day (TID) | INTRAMUSCULAR | Status: DC | PRN

## 2021-03-18 MED ORDER — ORAL CARE MOUTH RINSE: 15.0000 mL | Freq: Once | OROMUCOSAL | Status: DC

## 2021-03-18 MED ORDER — VANCOMYCIN HCL 1250 MG/250ML IV SOLN
1250.0000 mg | Freq: Two times a day (BID) | INTRAVENOUS | Status: DC
  Administered 2021-03-19 – 2021-03-25 (×14): 1250 mg via INTRAVENOUS
  Filled 2021-03-18 (×14): qty 250

## 2021-03-18 MED ORDER — STERILE WATER FOR IRRIGATION IR SOLN
Status: DC | PRN
  Administered 2021-03-18: 2000 mL

## 2021-03-18 MED ORDER — ESMOLOL HCL 100 MG/10ML IV SOLN
INTRAVENOUS | Status: DC | PRN
  Administered 2021-03-18: 20 mg via INTRAVENOUS
  Administered 2021-03-18: 40 mg via INTRAVENOUS

## 2021-03-18 MED ORDER — METOCLOPRAMIDE HCL 5 MG PO TABS: 5.0000 mg | ORAL_TABLET | Freq: Three times a day (TID) | ORAL | Status: DC | PRN

## 2021-03-18 MED ORDER — ENOXAPARIN SODIUM 120 MG/0.8ML IJ SOSY
120.0000 mg | PREFILLED_SYRINGE | Freq: Two times a day (BID) | INTRAMUSCULAR | Status: DC
  Administered 2021-03-19 – 2021-03-31 (×23): 120 mg via SUBCUTANEOUS
  Filled 2021-03-18 (×26): qty 0.8

## 2021-03-18 MED ORDER — HYDROMORPHONE HCL 1 MG/ML IJ SOLN
0.2500 mg | INTRAMUSCULAR | Status: DC | PRN
  Administered 2021-03-18 (×2): 0.5 mg via INTRAVENOUS

## 2021-03-18 MED ORDER — ACETAMINOPHEN 325 MG PO TABS
325.0000 mg | ORAL_TABLET | Freq: Four times a day (QID) | ORAL | Status: DC | PRN
  Administered 2021-03-19: 325 mg via ORAL
  Administered 2021-03-19 (×3): 650 mg via ORAL
  Administered 2021-03-20: 325 mg via ORAL
  Administered 2021-03-20 – 2021-03-21 (×5): 650 mg via ORAL
  Administered 2021-03-22: 325 mg via ORAL
  Administered 2021-03-22 – 2021-03-31 (×35): 650 mg via ORAL
  Filled 2021-03-18 (×46): qty 2

## 2021-03-18 MED ORDER — FENTANYL CITRATE (PF) 100 MCG/2ML IJ SOLN
INTRAMUSCULAR | Status: DC | PRN
  Administered 2021-03-18 (×6): 50 ug via INTRAVENOUS

## 2021-03-18 MED ORDER — ACETAMINOPHEN 500 MG PO TABS
1000.0000 mg | ORAL_TABLET | Freq: Once | ORAL | Status: AC
  Administered 2021-03-18: 1000 mg via ORAL
  Filled 2021-03-18: qty 2

## 2021-03-18 MED ORDER — PROPOFOL 10 MG/ML IV BOLUS
INTRAVENOUS | Status: DC | PRN
  Administered 2021-03-18 (×2): 50 mg via INTRAVENOUS
  Administered 2021-03-18: 20 mg via INTRAVENOUS
  Administered 2021-03-18: 100 mg via INTRAVENOUS

## 2021-03-18 MED ORDER — ONDANSETRON HCL 4 MG/2ML IJ SOLN: 4.0000 mg | Freq: Four times a day (QID) | INTRAMUSCULAR | Status: DC | PRN

## 2021-03-18 MED ORDER — MIDAZOLAM HCL 5 MG/5ML IJ SOLN
INTRAMUSCULAR | Status: DC | PRN
  Administered 2021-03-18: 2 mg via INTRAVENOUS

## 2021-03-18 MED ORDER — ACETAMINOPHEN 325 MG PO TABS: 650.0000 mg | ORAL_TABLET | Freq: Once | ORAL | Status: DC

## 2021-03-18 MED ORDER — OXYCODONE HCL 5 MG PO TABS: 5.0000 mg | ORAL_TABLET | ORAL | Status: DC | PRN

## 2021-03-18 MED ORDER — PHENYLEPHRINE HCL (PRESSORS) 10 MG/ML IV SOLN
INTRAVENOUS | Status: AC
  Filled 2021-03-18: qty 2

## 2021-03-18 MED ORDER — CHLORHEXIDINE GLUCONATE 0.12 % MT SOLN: 15.0000 mL | Freq: Once | OROMUCOSAL | Status: DC

## 2021-03-18 MED ORDER — FENTANYL CITRATE (PF) 100 MCG/2ML IJ SOLN
INTRAMUSCULAR | Status: AC
  Filled 2021-03-18: qty 2

## 2021-03-18 MED ORDER — SENNA 8.6 MG PO TABS
1.0000 | ORAL_TABLET | Freq: Two times a day (BID) | ORAL | Status: DC
  Administered 2021-03-21 – 2021-03-30 (×2): 8.6 mg via ORAL
  Filled 2021-03-18 (×14): qty 1

## 2021-03-18 MED ORDER — OXYCODONE HCL 5 MG PO TABS: 10.0000 mg | ORAL_TABLET | ORAL | Status: DC | PRN

## 2021-03-18 MED ORDER — ACETAMINOPHEN 325 MG PO TABS
650.0000 mg | ORAL_TABLET | Freq: Once | ORAL | Status: AC | PRN
  Administered 2021-03-18: 650 mg via ORAL
  Filled 2021-03-18: qty 2

## 2021-03-18 MED ORDER — PHENYLEPHRINE HCL-NACL 20-0.9 MG/250ML-% IV SOLN
INTRAVENOUS | Status: DC | PRN
  Administered 2021-03-18: 40 ug/min via INTRAVENOUS

## 2021-03-18 MED ORDER — LIDOCAINE 2% (20 MG/ML) 5 ML SYRINGE
INTRAMUSCULAR | Status: DC | PRN
  Administered 2021-03-18: 100 mg via INTRAVENOUS

## 2021-03-18 MED ORDER — SODIUM CHLORIDE 0.9 % IV SOLN
2.0000 g | INTRAVENOUS | Status: DC
  Administered 2021-03-19 – 2021-03-20 (×2): 2 g via INTRAVENOUS
  Filled 2021-03-18 (×2): qty 20

## 2021-03-18 MED ORDER — MIDAZOLAM HCL 2 MG/2ML IJ SOLN
INTRAMUSCULAR | Status: AC
  Filled 2021-03-18: qty 2

## 2021-03-18 MED ORDER — DOCUSATE SODIUM 100 MG PO CAPS
100.0000 mg | ORAL_CAPSULE | Freq: Two times a day (BID) | ORAL | Status: DC
  Administered 2021-03-20 – 2021-03-30 (×8): 100 mg via ORAL
  Filled 2021-03-18 (×20): qty 1

## 2021-03-18 MED ORDER — ONDANSETRON HCL 4 MG/2ML IJ SOLN
INTRAMUSCULAR | Status: AC
  Filled 2021-03-18: qty 2

## 2021-03-18 MED ORDER — PROPOFOL 10 MG/ML IV BOLUS
INTRAVENOUS | Status: AC
  Filled 2021-03-18: qty 20

## 2021-03-18 MED ORDER — SODIUM CHLORIDE 0.9 % IV SOLN: 2.0000 g | INTRAVENOUS | Status: DC

## 2021-03-18 MED ORDER — LACTATED RINGERS IV SOLN: INTRAVENOUS | Status: DC

## 2021-03-18 MED ORDER — ONDANSETRON HCL 4 MG PO TABS: 4.0000 mg | ORAL_TABLET | Freq: Four times a day (QID) | ORAL | Status: DC | PRN

## 2021-03-18 MED ORDER — ACETAMINOPHEN 500 MG PO TABS: 1000.0000 mg | ORAL_TABLET | Freq: Once | ORAL | Status: DC

## 2021-03-18 MED ORDER — LIDOCAINE HCL (PF) 2 % IJ SOLN
INTRAMUSCULAR | Status: AC
  Filled 2021-03-18: qty 5

## 2021-03-18 MED ORDER — HYDROMORPHONE HCL 1 MG/ML IJ SOLN
INTRAMUSCULAR | Status: AC
  Filled 2021-03-18: qty 1

## 2021-03-18 MED ORDER — DEXAMETHASONE SODIUM PHOSPHATE 10 MG/ML IJ SOLN
INTRAMUSCULAR | Status: DC | PRN
  Administered 2021-03-18: 4 mg via INTRAVENOUS

## 2021-03-18 MED ORDER — CELECOXIB 200 MG PO CAPS: 200.0000 mg | ORAL_CAPSULE | Freq: Once | ORAL | Status: DC

## 2021-03-18 MED ORDER — 0.9 % SODIUM CHLORIDE (POUR BTL) OPTIME
TOPICAL | Status: DC | PRN
  Administered 2021-03-18: 1000 mL

## 2021-03-18 MED ORDER — ONDANSETRON HCL 4 MG/2ML IJ SOLN
INTRAMUSCULAR | Status: DC | PRN
  Administered 2021-03-18: 4 mg via INTRAVENOUS

## 2021-03-18 MED ORDER — HYDROMORPHONE HCL 1 MG/ML IJ SOLN: 0.5000 mg | INTRAMUSCULAR | Status: DC | PRN

## 2021-03-18 MED ORDER — PHENYLEPHRINE 40 MCG/ML (10ML) SYRINGE FOR IV PUSH (FOR BLOOD PRESSURE SUPPORT)
PREFILLED_SYRINGE | INTRAVENOUS | Status: DC | PRN
  Administered 2021-03-18 (×2): 80 ug via INTRAVENOUS

## 2021-03-18 SURGICAL SUPPLY — 49 items
BAG COUNTER SPONGE SURGICOUNT (BAG) IMPLANT
BAG ZIPLOCK 12X15 (MISCELLANEOUS) ×2 IMPLANT
BANDAGE ESMARK 6X9 LF (GAUZE/BANDAGES/DRESSINGS) ×1 IMPLANT
BNDG ELASTIC 6X10 VLCR STRL LF (GAUZE/BANDAGES/DRESSINGS) ×1 IMPLANT
BNDG ESMARK 6X9 LF (GAUZE/BANDAGES/DRESSINGS) ×2
BNDG GAUZE ELAST 4 BULKY (GAUZE/BANDAGES/DRESSINGS) ×2 IMPLANT
CANISTER WOUND CARE 500ML ATS (WOUND CARE) ×1 IMPLANT
CHLORAPREP W/TINT 26 (MISCELLANEOUS) ×2 IMPLANT
COVER SURGICAL LIGHT HANDLE (MISCELLANEOUS) ×2 IMPLANT
CUFF TOURN SGL QUICK 18X4 (TOURNIQUET CUFF) ×2 IMPLANT
CUFF TOURN SGL QUICK 24 (TOURNIQUET CUFF) ×2
CUFF TOURN SGL QUICK 34 (TOURNIQUET CUFF) ×2
CUFF TRNQT CYL 24X4X16.5-23 (TOURNIQUET CUFF) ×1 IMPLANT
CUFF TRNQT CYL 34X4.125X (TOURNIQUET CUFF) ×1 IMPLANT
DRAIN PENROSE 0.5X18 (DRAIN) ×2 IMPLANT
DRSG PAD ABDOMINAL 8X10 ST (GAUZE/BANDAGES/DRESSINGS) ×2 IMPLANT
DRSG VAC ATS MED SENSATRAC (GAUZE/BANDAGES/DRESSINGS) ×1 IMPLANT
DRSG VERSA FOAM LRG 10X15 (GAUZE/BANDAGES/DRESSINGS) ×1 IMPLANT
ELECT REM PT RETURN 15FT ADLT (MISCELLANEOUS) ×2 IMPLANT
GAUZE SPONGE 4X4 12PLY STRL (GAUZE/BANDAGES/DRESSINGS) ×2 IMPLANT
GLOVE SRG 8 PF TXTR STRL LF DI (GLOVE) ×1 IMPLANT
GLOVE SURG ENC TEXT LTX SZ7 (GLOVE) IMPLANT
GLOVE SURG ENC TEXT LTX SZ7.5 (GLOVE) ×4 IMPLANT
GLOVE SURG NEOPR MICRO LF SZ8 (GLOVE) IMPLANT
GLOVE SURG ORTHO LTX SZ7.5 (GLOVE) ×4 IMPLANT
GLOVE SURG ORTHO LTX SZ8 (GLOVE) ×2 IMPLANT
GLOVE SURG UNDER POLY LF SZ7.5 (GLOVE) ×2 IMPLANT
GLOVE SURG UNDER POLY LF SZ8 (GLOVE) ×2
GLOVE SURG UNDER POLY LF SZ8.5 (GLOVE) ×2 IMPLANT
GOWN STRL REUS W/TWL LRG LVL3 (GOWN DISPOSABLE) ×2 IMPLANT
GOWN STRL REUS W/TWL XL LVL3 (GOWN DISPOSABLE) ×4 IMPLANT
HANDPIECE INTERPULSE COAX TIP (DISPOSABLE) ×2
IMMOBILIZER KNEE 20 (SOFTGOODS) ×2 IMPLANT
IMMOBILIZER KNEE 20 THIGH 36 (SOFTGOODS) IMPLANT
JET LAVAGE IRRISEPT WOUND (IRRIGATION / IRRIGATOR)
KIT BASIN OR (CUSTOM PROCEDURE TRAY) ×2 IMPLANT
KIT TURNOVER KIT A (KITS) ×2 IMPLANT
LAVAGE JET IRRISEPT WOUND (IRRIGATION / IRRIGATOR) IMPLANT
MANIFOLD NEPTUNE II (INSTRUMENTS) ×2 IMPLANT
PACK ORTHO EXTREMITY (CUSTOM PROCEDURE TRAY) ×2 IMPLANT
PAD CAST 4YDX4 CTTN HI CHSV (CAST SUPPLIES) ×1 IMPLANT
PADDING CAST COTTON 4X4 STRL (CAST SUPPLIES) ×2
PADDING CAST COTTON 6X4 STRL (CAST SUPPLIES) ×1 IMPLANT
PENCIL SMOKE EVACUATOR (MISCELLANEOUS) IMPLANT
PROTECTOR NERVE ULNAR (MISCELLANEOUS) ×2 IMPLANT
SET HNDPC FAN SPRY TIP SCT (DISPOSABLE) ×1 IMPLANT
SOL PREP PROV IODINE SCRUB 4OZ (MISCELLANEOUS) ×2 IMPLANT
SYR CONTROL 10ML LL (SYRINGE) ×2 IMPLANT
TOWEL OR 17X26 10 PK STRL BLUE (TOWEL DISPOSABLE) ×4 IMPLANT

## 2021-03-18 NOTE — Progress Notes (Signed)
Pharmacy Antibiotic Note  Harry [name] is a 70 y.o. male admitted on 03/11/2021 with  wound infection .  Pharmacy has been consulted for vancomycin dosing.  Pt underwent I&D with placement of wound vac on 9/28. Currently on CTX, antibiotic coverage being broadened with the addition of vancomycin.  Plan: Vancomycin 1500 mg pre-op surgical ppx dose given. Will order maintenance dose of 1250 mg IV q12h for estimated AUC of 514 Check SCr with AM labs tomorrow. Follow renal function, culture data.  Goal vancomycin AUC = 400-550. Check levels at steady state as needed   Height: 6\' 4"  (193 cm) Weight: 129.3 kg (285 lb) IBW/kg (Calculated) : 86.8  Temp (24hrs), Avg:97.8 F (36.6 C), Min:97.5 F (36.4 C), Max:98 F (36.7 C)  Recent Labs  Lab 03/12/21 0451 03/13/21 0431 03/14/21 0444 03/15/21 0428  WBC 7.8 7.1 6.8 7.5  CREATININE 0.94 0.90 0.87  --     Estimated Creatinine Clearance: 117.7 mL/min (by C-G formula based on SCr of 0.87 mg/dL).    No Known Allergies  Antimicrobials this admission: ceftriaxone 9/26 >>  vancomycin 9/28 >>   Dose adjustments this admission:  Microbiology results: 9/28 Surgical deep wound:   Thank you for allowing pharmacy to be a part of this patient's care.  Lenis Noon, PharmD 03/18/2021 6:22 PM

## 2021-03-18 NOTE — Op Note (Addendum)
OPERATIVE REPORT   03/18/2021  4:50 PM  PATIENT:  Harry [name]   SURGEON:  Bertram Savin, MD  ASSISTANT:  Cherlynn June, PA-C.   PREOPERATIVE DIAGNOSIS: Status post revision right total knee arthroplasty with MCL and extensor mechanism repair with skin edge necrosis.  POSTOPERATIVE DIAGNOSIS:  Same.  PROCEDURE:  1.  Excisional debridement of skin and subcutaneous tissue right knee. 2.  Placement of negative pressure wound dressing.  ANESTHESIA:   GETA.  ANTIBIOTICS: 1.5 g vancomycin. Already receiving IV ceftriaxone scheduled.  IMPLANTS: None.  SPECIMENS: Right knee swab for aerobic and anaerobic culture.  COMPLICATIONS: None.  DISPOSITION: Stable to PACU.  SURGICAL INDICATIONS:  Harry [name] is a 70 y.o. male who underwent primary right total knee arthroplasty in July of this year.  Postoperative course was complicated by patellar tendon rupture and dislocation of total knee arthroplasty.  He underwent revision total knee arthroplasty to CCK component and repair of extensor mechanism and MCL on 01/21/2021.  He had an area of questionable skin viability that we have been watching.  When the wound fully demarcated, he was indicated for debridement.  He was brought to the operating room last week, and upon induction, he developed A. fib with RVR and hypotension.  The surgery was therefore canceled.  He had cardiology evaluation and was deemed stable to proceed at this point.  We discussed the risk, benefits, and alternatives.  He elected to proceed.  The risks, benefits, and alternatives were discussed with the patient preoperatively including but not limited to the risks of infection, bleeding, nerve / blood vessel injury, cardiopulmonary complications, the need for repeat surgery, among others, and the patient was willing to proceed.  PROCEDURE IN DETAIL: The patient was identified in the holding area using 2 identifiers.  The surgical site was marked by myself.   He was taken to the operating room, placed supine on the operating table.  General anesthesia was induced.  A bump was placed under the right hip.  A tourniquet was not utilized.  All bony prominences were well-padded.  The right lower extremity was prepped and draped in the normal sterile surgical fashion.  Timeout was called, verifying side and site of surgery.  I began by examining the wound.  He had an area of necrotic skin mostly on the medial edge but also extending through the incision and over to the lateral skin flap that measured 9 x 6 cm.  Using a #10 blade, I excisionally debrided the skin and subcutaneous tissue in this area.  Full-thickness skin flaps were then raised around the periphery of the wound.  I opened up the incision a couple centimeters distally.  I inspected his area of patellar tendon repair and MCL repair that were intact.  The suture anchor holding the hamstrings had pulled out and the tendon was torn.  I excisionally debrided tendon edges with scissors.  I remove the anchor and the associated sutures.  The medial aspect of the retinacular repair had failed.  There was no purulence or evidence of infection.  I sent a routine aerobic and anaerobic tissue swab for culture.  The wound was then irrigated with 3 L of normal saline.  I repaired the medial parapatellar arthrotomy with #1 Vicryl.  I oversewed the lateral aspect of the patellar tendon to the capsule.  We had a good watertight closure of the joint at this point.  I then closed the aspect of the incision that I had opened with 2-0  Monocryl deep dermal sutures and 2-0 nylon sutures using a vertical mattress technique.  Because there was exposed patella and the wound, I placed a single white VAC sponge into the wound.  Adaptic was laid over the incision that I opened and a piece of black granufoam sponge was fashioned to make an incisional VAC that connected to the's white sponge in the open wound.  The VAC was then assembled and  hooked up to suction at 75 mmHg.  There was excellent seal without any leak.  Bulky dressing was then placed with cast padding and an Ace wrap.  A knee immobilizer was applied.  Patient was then awakened from anesthesia and taken to the PACU in stable condition.  Sponge, needle, and instrument counts were correct at the end of the case x2.  There were no known complications.  POSTOPERATIVE PLAN: Postoperatively, the patient be readmitted to the hospitalist service.  We will keep him in the knee immobilizer at all times.  The knee will be kept in extension at all times.  He may weight-bear as tolerated in the knee immobilizer with a walker.  We will continue IV ceftriaxone and vancomycin.  We will watch his culture.  I will discuss closure with plastic surgery.  He will likely return to the OR for definitive closure before discharge.   Debridement type: Excisional Debridement  Side: right  Body Location: knee   Tools used for debridement: scalpel and scissors  Pre-debridement Wound size (cm):   Length: 9        Width: 6     Depth: N/A eschar   Post-debridement Wound size (cm):   Length: 9        Width: 6     Depth: 0.5   Debridement depth beyond dead/damaged tissue down to healthy viable tissue: yes  Tissue layer involved: skin, subcutaneous tissue  Nature of tissue removed: Devitalized Tissue  Irrigation volume: 3L     Irrigation fluid type: Normal Saline

## 2021-03-18 NOTE — Progress Notes (Signed)
Pt arrived from PACU on floor bed. Pt drowsy, awakens on calling but groggy. VSS as charted. Wound vac in place to right knee at 10mmHg. Ace wrap to rt knee c/d/I and knee imobilizer in place. Callbell in reach. Wife bedside. Pt resting comfortably at present.

## 2021-03-18 NOTE — Progress Notes (Signed)
PROGRESS NOTE  [name]  DOB: 10/15/1950  PCP: Buzzy Han, MD IWL:[id number]  DOA: 03/11/2021  LOS: 5 days  Hospital Day: 8  Chief complaint: Tachycardia and hypotension postanesthesia  Brief narrative: Harry [name] is a 70 y.o. male with PMH significant for HTN, COPD, past history of smoking, chronic bilateral lower extremity lymphedema who underwent right knee arthroplasty on 01/07/2021 and was recuperating in the rehab.   On 8/1, at the rehab, he suffered an injury to the extensor mechanism.  While attempting to walk off of the stairs, he dislocated his knee and was brought to the ED.  At that time, x-ray showed anterior knee hardware dislocation.  It was successfully reduced.  Next day on 8/2, repeat x-ray showed recurrent dislocation of right knee arthroplasty.  It was successfully reduced again but patient had persistent patella alta. On 8/3 orthopedic surgeon Dr. Lyla Glassing took him to the OR for revision of total knee arthroplasty and reconstruction of the extensor mechanism and MCL. At his immediate postop visit, he was found to have some questionable skin flap viability.  He was treated for a total of 6 weeks in a cylinder cast and had wound checks at regular intervals. On 9/14, he was discharged from rehab to home. On follow-up on 9/20, his wound was found to be fully demarcated and was therefore indicated for debridement. Next day on 9/21 he was brought the hospital for elective irrigation and debridement of right knee. Patient was intubated for the procedure.  However upon induction of anesthesia, before the procedure had started, patient developed hypotension down to 60s in A. fib with RVR as well.  Anesthesia canceled the surgery. Patient was seen by critical care and cardiology. Admitted to hospital service. Review of the rhythm strips showed that patient had a flutter with RVR.  He was started on oral metoprolol and anticoagulation with full dose  Lovenox.  Medically clear for orthopedic procedure.   Patient is planned for orthopedic procedure today 9/28.  Subjective: Patient was seen and examined this morning.  Lying on bed.  Not in distress.  No new symptoms.  Wife at bedside.  Assessment/Plan: A flutter with RVR -Post anesthesia induction.  Cardiology consult appreciated.  Currently on metoprolol twice daily and anticoagulation with full dose Lovenox. -Plan to switch to Eliquis at the time of discharge. Recent Labs  Lab 03/11/21 1215 03/11/21 1629 03/12/21 0451 03/13/21 0431 03/14/21 0444  K 3.8 4.0 4.0 3.8 3.9  MG  --  1.8  --  2.2 2.0     Hypotension -Per report, he was hypotensive down to 60s after anesthesia induction. -He has history of hypertension and is on lisinopril as needed.  Currently blood pressure stable. -Continue to monitor.  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  Cardiac MRI nonconclusive.   Chronic bilateral lower extremity lymphedema -Denies a known history of CHF.  Not on any diuretics at home.   -Echocardiogram 1/22 showed EF of 60 to 65%, no regional wall motion abnormality, mild LVH, RV size and function normal.   -Ultrasound duplex lower extremities and CTPA negative for DVT PE -Bilateral lower extremity edema might have been secondary to immobilization.  Pedal edema persists.  On long run, he may benefit from diuretics at home.   History of COPD -Stopped smoking 14 years ago.   Hyperlipidemia -Continue statin.  Keep aspirin on hold.  Anemia Vitamin B12 deficiency. -Patient had normal hemoglobin over 12 till the knee arthroplasty June 2022.  Since then his  hemoglobin level has been running less than 9 mostly.  No evidence of active bleeding.  Hemoglobin currently between between 9 and 10.  Ferritin level appropriate at 66, vitamin B12 level at the lower limit of normal.  Oral vitamin B12 has been started. Recent Labs    03/11/21 1629 03/12/21 0451 03/13/21 0431 03/14/21 0444  03/15/21 0428  HGB 9.5* 8.9* 8.8* 9.0* 9.7*  MCV 90.6 90.3 90.6 90.0 89.3  VITAMINB12  --   --  274  --   --   FOLATE  --   --  12.8  --   --   FERRITIN  --   --  66  --   --   TIBC  --   --  182*  --   --   IRON  --   --  25*  --   --   RETICCTPCT  --   --  2.5  --   --     Right knee arthroplasty, dislocation, patella alta -Orthopedics following.  Previously planned procedure for 9/21 was postponed.  Noted a plan to do today 9/28.  Insomnia -Melatonin as needed at bedtime.  Mobility: PT eval postprocedure Code Status:   Code Status: Full Code  Nutritional status: Body mass index is 34.69 kg/m.     Diet:  Diet Order             Diet NPO time specified  Diet effective ____                  DVT prophylaxis: Lovenox subcu   Antimicrobials: None Fluid: Not on IV fluid Consultants: Cardiology, orthopedics Family Communication: None at bedside  Status is: Inpatient  Remains inpatient appropriate because: Pending orthopedic surgery on 9/28  Dispo: The patient is from: Home              Anticipated d/c is to: Home versus rehab              Patient currently is not medically stable to d/c.   Difficult to place patient No     Infusions:   cefTRIAXone (ROCEPHIN)  IV Stopped (03/18/21 1054)   lactated ringers 10 mL/hr at 03/15/21 1334   vancomycin      Scheduled Meds:  atorvastatin  20 mg Oral Q1500   docusate sodium  100 mg Oral BID   metoprolol tartrate  25 mg Oral BID   pantoprazole  40 mg Oral Daily   povidone-iodine  2 application Topical Once   senna  1 tablet Oral BID   vitamin B-12  1,000 mcg Oral Daily    Antimicrobials: Anti-infectives (From admission, onward)    Start     Dose/Rate Route Frequency Ordered Stop   03/18/21 1200  vancomycin (VANCOREADY) IVPB 1500 mg/300 mL        1,500 mg 150 mL/hr over 120 Minutes Intravenous On call to O.R. 03/17/21 0755 03/19/21 1159   03/16/21 1000  cefTRIAXone (ROCEPHIN) 2 g in sodium chloride 0.9 % 100  mL IVPB        2 g 200 mL/hr over 30 Minutes Intravenous Every 24 hours 03/16/21 0858     03/11/21 1215  ceFAZolin (ANCEF) IVPB 2g/100 mL premix        2 g 200 mL/hr over 30 Minutes Intravenous On call to O.R. 03/11/21 1201 03/12/21 0559   03/11/21 1215  vancomycin (VANCOREADY) IVPB 1500 mg/300 mL        1,500 mg 150 mL/hr over 120  Minutes Intravenous On call to O.R. 03/11/21 1201 03/11/21 1535       PRN meds: acetaminophen **OR** acetaminophen, albuterol, hydrALAZINE, melatonin   Objective: Vitals:   03/18/21 0622 03/18/21 0926  BP: 129/66 109/71  Pulse: 77 67  Resp:  20  Temp: 97.8 F (36.6 C) (!) 97.5 F (36.4 C)  SpO2: 93% 98%    Intake/Output Summary (Last 24 hours) at 03/18/2021 1158 Last data filed at 03/18/2021 1054 Gross per 24 hour  Intake 458.7 ml  Output 950 ml  Net -491.3 ml    Filed Weights   03/10/21 1510  Weight: 129.3 kg   Weight change:  Body mass index is 34.69 kg/m.   Physical Exam: General exam: Pleasant, elderly Caucasian male.  Not in distress Skin: No rashes, lesions or ulcers. HEENT: Atraumatic, normocephalic, no obvious bleeding Lungs: Clear to auscultation bilaterally CVS: Regular rate and rhythm, no murmur GI/Abd soft, nontender, nondistended, bowel sound present CNS: Alert, awake, oriented x3 Psychiatry: Mood appropriate Extremities: Improving bilateral 1+ pedal edema, right more than left. Right knee immobilizer in place.  Data Review: I have personally reviewed the laboratory data and studies available.  Recent Labs  Lab 03/11/21 1629 03/12/21 0451 03/13/21 0431 03/14/21 0444 03/15/21 0428  WBC 8.6 7.8 7.1 6.8 7.5  HGB 9.5* 8.9* 8.8* 9.0* 9.7*  HCT 31.0* 29.0* 28.9* 28.8* 30.9*  MCV 90.6 90.3 90.6 90.0 89.3  PLT 289 254 262 262 269    Recent Labs  Lab 03/11/21 1215 03/11/21 1629 03/12/21 0451 03/13/21 0431 03/14/21 0444  NA 137 137 140 136 140  K 3.8 4.0 4.0 3.8 3.9  CL 105 106 109 103 110  CO2 22 25 26 24  23   GLUCOSE 104* 96 97 97 94  BUN 13 12 12 9 10   CREATININE 0.90 0.82 0.94 0.90 0.87  CALCIUM 8.9 8.5* 8.7* 8.1* 8.6*  MG  --  1.8  --  2.2 2.0     F/u labs ordered Unresulted Labs (From admission, onward)    None       Signed, Terrilee Croak, MD [facility] 03/18/2021

## 2021-03-18 NOTE — Anesthesia Preprocedure Evaluation (Addendum)
Anesthesia Evaluation  Patient identified by MRN, date of birth, ID band Patient awake    Reviewed: NPO status , reviewed documented beta blocker date and time   History of Anesthesia Complications (+) PONV and history of anesthetic complications  Airway Mallampati: II  TM Distance: >3 FB Neck ROM: Full    Dental  (+) Poor Dentition, Missing, Dental Advisory Given   Pulmonary former smoker,    breath sounds clear to auscultation       Cardiovascular hypertension, Pt. on home beta blockers + dysrhythmias Supra Ventricular Tachycardia  Rhythm:Regular Rate:Normal  IMPRESSIONS    1. Extremely limited; definity used; normal LV function; hyperechoic area  superior right atrium and atrial septum; likely calcification; suggest TEE  or cardiac MRI to further assess.  2. Left ventricular ejection fraction, by estimation, is 60 to 65%. The  left ventricle has normal function. The left ventricle has no regional  wall motion abnormalities. There is mild left ventricular hypertrophy.  Left ventricular diastolic parameters  are consistent with Grade I diastolic dysfunction (impaired relaxation).  3. Right ventricular systolic function is normal. The right ventricular  size is normal.  4. The mitral valve is normal in structure. No evidence of mitral valve  regurgitation. No evidence of mitral stenosis.  5. The aortic valve was not well visualized. Aortic valve regurgitation  is not visualized. No aortic stenosis is present.  6. Aortic dilatation noted. There is mild dilatation of the aortic root,  measuring 40 mm.  7. The inferior vena cava is normal in size with greater than 50%  respiratory variability, suggesting right atrial pressure of 3 mmHg.    Neuro/Psych    GI/Hepatic Neg liver ROS, GERD  ,  Endo/Other  negative endocrine ROS  Renal/GU negative Renal ROS     Musculoskeletal  (+) Arthritis ,   Abdominal    Peds  Hematology   Anesthesia Other Findings   Reproductive/Obstetrics                            Anesthesia Physical  Anesthesia Plan  ASA: 3  Anesthesia Plan: General   Post-op Pain Management:    Induction: Intravenous  PONV Risk Score and Plan: 3 and Ondansetron, Dexamethasone and Midazolam  Airway Management Planned: Oral ETT and LMA  Additional Equipment:   Intra-op Plan:   Post-operative Plan: Extubation in OR  Informed Consent: I have reviewed the patients History and Physical, chart, labs and discussed the procedure including the risks, benefits and alternatives for the proposed anesthesia with the patient or authorized representative who has indicated his/her understanding and acceptance.     Dental advisory given  Plan Discussed with: Anesthesiologist and CRNA  Anesthesia Plan Comments: (Per cardiology:-No further cardiac work-up recommended prior to surgery  )       Anesthesia Quick Evaluation

## 2021-03-18 NOTE — Transfer of Care (Signed)
Immediate Anesthesia Transfer of Care Note  Patient: Harry [name]  Procedure(s) Performed: IRRIGATION AND DEBRIDEMENT knee placement wound vac (Right)  Patient Location: PACU  Anesthesia Type:General  Level of Consciousness: drowsy and patient cooperative  Airway & Oxygen Therapy: Patient Spontanous Breathing and Patient connected to face mask oxygen  Post-op Assessment: Report given to RN and Post -op Vital signs reviewed and stable  Post vital signs: Reviewed and stable  Last Vitals:  Vitals Value Taken Time  BP 101/75 03/18/21 1647  Temp 36.4 C 03/18/21 1647  Pulse 77 03/18/21 1653  Resp 16 03/18/21 1653  SpO2 98 % 03/18/21 1653  Vitals shown include unvalidated device data.  Last Pain:  Vitals:   03/18/21 1340  TempSrc: Oral  PainSc:       Patients Stated Pain Goal: 1 (15/52/08 0223)  Complications: No notable events documented.

## 2021-03-18 NOTE — Anesthesia Procedure Notes (Addendum)
Procedure Name: LMA Insertion Date/Time: 03/18/2021 3:41 PM Performed by: Montel Clock, CRNA Pre-anesthesia Checklist: Patient identified, Emergency Drugs available, Suction available, Patient being monitored and Timeout performed Patient Re-evaluated:Patient Re-evaluated prior to induction Oxygen Delivery Method: Circle system utilized Preoxygenation: Pre-oxygenation with 100% oxygen Induction Type: IV induction LMA: LMA with gastric port inserted LMA Size: 5.0 Number of attempts: 1 Dental Injury: Teeth and Oropharynx as per pre-operative assessment

## 2021-03-18 NOTE — Interval H&P Note (Signed)
History and Physical Interval Note:  03/18/2021 2:38 PM  Harry [name]  has presented today for surgery, with the diagnosis of infected right knee.  The various methods of treatment have been discussed with the patient and family. After consideration of risks, benefits and other options for treatment, the patient has consented to  Procedure(s): IRRIGATION AND DEBRIDEMENT knee possible placement wound vac (Right) as a surgical intervention.  The patient's history has been reviewed, patient examined, no change in status, stable for surgery.  I have reviewed the patient's chart and labs.  Questions were answered to the patient's satisfaction.     Hilton Cork Nitesh Pitstick

## 2021-03-18 NOTE — Plan of Care (Signed)
  Problem: Clinical Measurements: Goal: Will remain free from infection Outcome: Progressing Goal: Cardiovascular complication will be avoided Outcome: Progressing   Problem: Pain Managment: Goal: General experience of comfort will improve Outcome: Progressing   Problem: Safety: Goal: Ability to remain free from injury will improve Outcome: Progressing   Problem: Skin Integrity: Goal: Risk for impaired skin integrity will decrease Outcome: Progressing

## 2021-03-18 NOTE — Progress Notes (Addendum)
Pharmacy Brief Note - Anticoagulation Follow Up:  Patient has been on therapeutic enoxaparin during hospital admission for atrial fibrillation. Last dose pre-op given 9/27 @0523 .  Enoxaparin consult for afib re-released post-op. Paged on call [facility] coverage for guidance on when safe to resume therapeutic LMWH post-op. Per PA, hold off on enoxaparin tonight and assess with surgeon on POD#1.  CBC ordered with AM labs tomorrow.   Lenis Noon, PharmD 03/18/21 6:52 PM  Addendum:   Received clarification from Dr. Lyla Glassing to resume therapeutic enoxaparin tomorrow morning on POD#1.   Ordered enoxaparin 120 mg subQ q12h starting 9/29 @ 1000. Follow CBC, renal function. Monitor for signs of bleeding.   Lenis Noon, PharmD 03/18/21 7:50 PM

## 2021-03-18 NOTE — Plan of Care (Signed)

## 2021-03-19 ENCOUNTER — Other Ambulatory Visit (HOSPITAL_COMMUNITY): Payer: Self-pay

## 2021-03-19 ENCOUNTER — Encounter (HOSPITAL_COMMUNITY): Payer: Self-pay | Admitting: Orthopedic Surgery

## 2021-03-19 DIAGNOSIS — I480 Paroxysmal atrial fibrillation: Secondary | ICD-10-CM | POA: Diagnosis not present

## 2021-03-19 DIAGNOSIS — I9789 Other postprocedural complications and disorders of the circulatory system, not elsewhere classified: Secondary | ICD-10-CM | POA: Diagnosis not present

## 2021-03-19 DIAGNOSIS — R000 Tachycardia, unspecified: Secondary | ICD-10-CM | POA: Diagnosis not present

## 2021-03-19 DIAGNOSIS — I96 Gangrene, not elsewhere classified: Secondary | ICD-10-CM | POA: Diagnosis not present

## 2021-03-19 LAB — BASIC METABOLIC PANEL
Anion gap: 8 (ref 5–15)
BUN: 13 mg/dL (ref 8–23)
CO2: 25 mmol/L (ref 22–32)
Calcium: 8.5 mg/dL — ABNORMAL LOW (ref 8.9–10.3)
Chloride: 105 mmol/L (ref 98–111)
Creatinine, Ser: 0.74 mg/dL (ref 0.61–1.24)
GFR, Estimated: >60 mL/min (ref >60)
Glucose, Bld: 117 mg/dL — ABNORMAL HIGH (ref 70–99)
Potassium: 4 mmol/L (ref 3.5–5.1)
Sodium: 138 mmol/L (ref 135–145)

## 2021-03-19 LAB — CBC
HCT: 33.2 % — ABNORMAL LOW (ref 39.0–52.0)
Hemoglobin: 10.2 g/dL — ABNORMAL LOW (ref 13.0–17.0)
MCH: 27.5 pg (ref 26.0–34.0)
MCHC: 30.7 g/dL (ref 30.0–36.0)
MCV: 89.5 fL (ref 80.0–100.0)
Platelets: 287 10*3/uL (ref 150–400)
RBC: 3.71 MIL/uL — ABNORMAL LOW (ref 4.22–5.81)
RDW: 15.6 % — ABNORMAL HIGH (ref 11.5–15.5)
WBC: 9 10*3/uL (ref 4.0–10.5)
nRBC: 0 % (ref 0.0–0.2)

## 2021-03-19 MED ORDER — PROSOURCE PLUS PO LIQD
30.0000 mL | Freq: Two times a day (BID) | ORAL | Status: DC
  Administered 2021-03-19 – 2021-03-31 (×24): 30 mL via ORAL
  Filled 2021-03-19 (×23): qty 30

## 2021-03-19 NOTE — Progress Notes (Signed)
Physical Therapy Treatment Patient Details Name: Harry [name] MRN: [id number] DOB: Apr 05, 1951 Today's Date: 03/19/2021   History of Present Illness Pt is a 70 year old male and had ortho follow-up on 9/20, his wound was found to be fully demarcated and was therefore indicated for debridement.  He was at the hospital for elective irrigation and debridement of right knee.  Patient was intubated for the procedure.  However upon induction of anesthesia, before the procedure had started, patient developed hypotension down to 60s in A. fib with RVR as well. Pt s/p Excisional debridement of skin and subcutaneous tissue right knee and Placement of negative pressure wound dressing on 03/18/21.   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  Pt with hx of arthritis, HTN, and obesity    PT Comments     Pt requested not to remain in recliner more then an hour so returned to assist pt back to bed.  Pt able to take a few steps with returning to bed however did not feel ready to ambulate yet, hopefully tomorrow.  Pt reports likely return to surgery on Monday and would like to return to ambulating as he was doing this at home.   Recommendations for follow up therapy are one component of a multi-disciplinary discharge planning process, led by the attending physician.  Recommendations may be updated based on patient status, additional functional criteria and insurance authorization.  Follow Up Recommendations  Home health PT;Supervision for mobility/OOB (pt was receiving HHPT prior to admission)     Equipment Recommendations  None recommended by PT    Recommendations for Other Services       Precautions / Restrictions Precautions Precautions: Knee;Fall Required Braces or Orthoses: Knee Immobilizer - Right Knee Immobilizer - Right: On at all times Restrictions RLE Weight Bearing: Weight bearing as tolerated Other Position/Activity Restrictions: with KI and  Walker     Mobility  Bed Mobility Overal bed mobility: Needs Assistance Bed Mobility: Sit to Supine     Supine to sit: HOB elevated;Min guard Sit to supine: Supervision;HOB elevated   General bed mobility comments: min/guard for lines    Transfers Overall transfer level: Needs assistance Equipment used: Rolling walker (2 wheeled) Transfers: Sit to/from [facility] Sit to Stand: Min assist Stand pivot transfers: Min guard       General transfer comment: cues for weight shifting, more assist to rise from lower recliner surface, able to take a few steps with returning to bed instead of only pivoting however pt did not feel ready to ambulate yet today  Ambulation/Gait             General Gait Details: pt politely declined first time OOB   Stairs             Wheelchair Mobility    Modified Rankin (Stroke Patients Only)       Balance                                            Cognition Arousal/Alertness: Awake/alert Behavior During Therapy: WFL for tasks assessed/performed Overall Cognitive Status: Within Functional Limits for tasks assessed  Exercises      General Comments        Pertinent Vitals/Pain Pain Assessment: 0-10 Pain Score: 3  Pain Location: right knee Pain Descriptors / Indicators: Sore;Aching Pain Intervention(s): Repositioned;Monitored during session    Home Living Family/patient expects to be discharged to:: Private residence Living Arrangements: Spouse/significant other Available Help at Discharge: Family;Available 24 hours/day Type of Home: House Home Access: Ramped entrance   Home Layout: One level Home Equipment: Wheelchair - manual;Other (comment);Bedside commode;Walker - 2 wheels;Cane - single point (lift chair)      Prior Function Level of Independence: Independent with assistive device(s)      Comments: ambulatory with RW  once home from rehab   PT Goals (current goals can now be found in the care plan section) Acute Rehab PT Goals PT Goal Formulation: With patient Time For Goal Achievement: 04/02/21 Potential to Achieve Goals: Good Progress towards PT goals: Progressing toward goals    Frequency    7X/week      PT Plan Current plan remains appropriate    Co-evaluation              AM-PAC PT "6 Clicks" Mobility   Outcome Measure  Help needed turning from your back to your side while in a flat bed without using bedrails?: A Little Help needed moving from lying on your back to sitting on the side of a flat bed without using bedrails?: A Little Help needed moving to and from a bed to a chair (including a wheelchair)?: A Little Help needed standing up from a chair using your arms (e.g., wheelchair or bedside chair)?: A Little Help needed to walk in hospital room?: A Little Help needed climbing 3-5 steps with a railing? : A Lot 6 Click Score: 17    End of Session Equipment Utilized During Treatment: Gait belt;Right knee immobilizer Activity Tolerance: Patient tolerated treatment well Patient left: in bed;with call bell/phone within reach;with bed alarm set Nurse Communication: Mobility status PT Visit Diagnosis: Other abnormalities of gait and mobility (R26.89)     Time: 3888-2800 PT Time Calculation (min) (ACUTE ONLY): 12 min  Charges:  $Therapeutic Activity: 8-22 mins                     Jannette Spanner PT, DPT Acute Rehabilitation Services Pager: [phone] Office: [place] 03/19/2021, 2:00 PM

## 2021-03-19 NOTE — Plan of Care (Signed)
Plan of care reviewed with pt and wife at bedside. Pt alert and oriented x4 and in no acute distress. Pt Has wound vac continuous suction to RLE and immobilizer on. Pt pain controlled with PO tylenol. Pt on RA. Pt using urinal for UOP and adequate UOP with this shift. Pt OOB to chair x1 with physical therapy. Pt IV antibiotics continued. Pt safety maintained. Will continue to monitor and reassess.   Problem: Education: Goal: Knowledge of General Education information will improve Description: Including pain rating scale, medication(s)/side effects and non-pharmacologic comfort measures Outcome: Progressing   Problem: Health Behavior/Discharge Planning: Goal: Ability to manage health-related needs will improve Outcome: Progressing   Problem: Clinical Measurements: Goal: Ability to maintain clinical measurements within normal limits will improve Outcome: Progressing Goal: Will remain free from infection Outcome: Progressing Goal: Diagnostic test results will improve Outcome: Progressing Goal: Respiratory complications will improve Outcome: Progressing Goal: Cardiovascular complication will be avoided Outcome: Progressing   Problem: Activity: Goal: Risk for activity intolerance will decrease Outcome: Progressing   Problem: Nutrition: Goal: Adequate nutrition will be maintained Outcome: Progressing   Problem: Coping: Goal: Level of anxiety will decrease Outcome: Progressing   Problem: Elimination: Goal: Will not experience complications related to bowel motility Outcome: Progressing Goal: Will not experience complications related to urinary retention Outcome: Progressing   Problem: Pain Managment: Goal: General experience of comfort will improve Outcome: Progressing   Problem: Safety: Goal: Ability to remain free from injury will improve Outcome: Progressing   Problem: Skin Integrity: Goal: Risk for impaired skin integrity will decrease Outcome: Progressing

## 2021-03-19 NOTE — Anesthesia Postprocedure Evaluation (Signed)
Anesthesia Post Note  Patient: Harry [name]  Procedure(s) Performed: IRRIGATION AND DEBRIDEMENT knee placement wound vac (Right)     Patient location during evaluation: PACU Anesthesia Type: General Level of consciousness: sedated Pain management: pain level controlled Vital Signs Assessment: post-procedure vital signs reviewed and stable Respiratory status: spontaneous breathing and respiratory function stable Cardiovascular status: stable Postop Assessment: no apparent nausea or vomiting Anesthetic complications: no   No notable events documented.               Beatriz Quintela DANIEL

## 2021-03-19 NOTE — Consult Note (Signed)
Reason for Consult/CC: Right knee wound  Harry [name] is an 70 y.o. male.  HPI: I was consulted for assistance with reconstruction of the patient's right knee wound.  He had a knee replacement back in July and shortly thereafter sounds like he had a rupture of his patellar tendon and MCL injury while doing physical therapy.  This prompted a revision arthroplasty and repair of his knee extensor mechanism which was done over 6 weeks ago.  He was casted for most of that time.  He unfortunately developed some skin necrosis on and superior to the patella.  Dr. Lyla Glassing took him back for debridement yesterday and did some additional repairs to the extensor mechanism and joint capsule.  I am consulted for help with coverage of the knee joint.  Also of note cultures were taken yesterday which show no growth as of yet.  Additionally patient said he injured his right knee many years ago playing football but was able to have it repaired and suspects this is the reason why it has been a complicated process with the total knee arthroplasty.  Allergies: No Known Allergies  Medications:  Current Facility-Administered Medications:    (feeding supplement) PROSource Plus liquid 30 mL, 30 mL, Oral, BID BM, Patrecia Pour, MD, 30 mL at 03/19/21 1625   acetaminophen (TYLENOL) tablet 325-650 mg, 325-650 mg, Oral, Q6H PRN, Rod Can, MD, 650 mg at 03/19/21 1625   atorvastatin (LIPITOR) tablet 20 mg, 20 mg, Oral, Q1500, Swinteck, Aaron Edelman, MD, 20 mg at 03/19/21 1626   cefTRIAXone (ROCEPHIN) 2 g in sodium chloride 0.9 % 100 mL IVPB, 2 g, Intravenous, Q24H, Lenis Noon, RPH, Last Rate: 200 mL/hr at 03/19/21 1043, 2 g at 03/19/21 1043   docusate sodium (COLACE) capsule 100 mg, 100 mg, Oral, BID, Swinteck, Aaron Edelman, MD   enoxaparin (LOVENOX) injection 120 mg, 120 mg, Subcutaneous, Q12H, Lenis Noon, RPH, 120 mg at 03/19/21 1037   melatonin tablet 3 mg, 3 mg, Oral, QHS PRN, Swinteck, Aaron Edelman, MD   metoCLOPramide (REGLAN)  tablet 5-10 mg, 5-10 mg, Oral, Q8H PRN **OR** metoCLOPramide (REGLAN) injection 5-10 mg, 5-10 mg, Intravenous, Q8H PRN, Swinteck, Aaron Edelman, MD   metoprolol tartrate (LOPRESSOR) tablet 25 mg, 25 mg, Oral, BID, Swinteck, Aaron Edelman, MD, 25 mg at 03/19/21 1035   ondansetron (ZOFRAN) tablet 4 mg, 4 mg, Oral, Q6H PRN **OR** ondansetron (ZOFRAN) injection 4 mg, 4 mg, Intravenous, Q6H PRN, Swinteck, Aaron Edelman, MD   pantoprazole (PROTONIX) EC tablet 40 mg, 40 mg, Oral, Daily, Swinteck, Brian, MD, 40 mg at 03/19/21 1036   senna (SENOKOT) tablet 8.6 mg, 1 tablet, Oral, BID, Swinteck, Aaron Edelman, MD   vancomycin (VANCOREADY) IVPB 1250 mg/250 mL, 1,250 mg, Intravenous, Q12H, Lenis Noon, RPH, Last Rate: 166.7 mL/hr at 03/19/21 1636, 1,250 mg at 03/19/21 1636   vitamin B-12 (CYANOCOBALAMIN) tablet 1,000 mcg, 1,000 mcg, Oral, Daily, Swinteck, Aaron Edelman, MD, 1,000 mcg at 03/19/21 1035  Past Medical History:  Diagnosis Date   Arthritis    Cancer ([place])    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   Dry mouth    GERD (gastroesophageal reflux disease)    Hypertension    taking blood pressure medication as needed   Macular degeneration    Bilateral   PONV (postoperative nausea and vomiting)    Tendonitis    Wound healing, delayed    bottom and right heel currently being treated    Past Surgical History:  Procedure Laterality Date   I & D EXTREMITY Right 03/18/2021  Procedure: IRRIGATION AND DEBRIDEMENT knee placement wound vac;  Surgeon: Rod Can, MD;  Location: [facility];  Service: Orthopedics;  Laterality: Right;   KNEE ARTHROPLASTY Right 01/07/2021   Procedure: COMPUTER ASSISTED TOTAL KNEE ARTHROPLASTY;  Surgeon: Rod Can, MD;  Location: [facility];  Service: Orthopedics;  Laterality: Right;   KNEE CLOSED REDUCTION Right 01/20/2021   Procedure: CLOSED MANIPULATION KNEE;  Surgeon: Nicholes Stairs, MD;  [facility]: [facility];  Service: Orthopedics;  Laterality: Right;   KNEE SURGERY Right    NOSE SURGERY     ORIF TIBIA PLATEAU  Right 01/21/2021   Procedure: PATELLA TENDON REPAIR;  Surgeon: Hiram Gash, MD;  [facility];  Service: Orthopedics;  Laterality: Right;   TOTAL KNEE ARTHROPLASTY Right 01/21/2021   Procedure: TOTAL KNEE REVISION BOTH COMPONENTS;  Surgeon: Rod Can, MD;  [facility];  Service: Orthopedics;  Laterality: Right;   WISDOM TOOTH EXTRACTION      History reviewed. No pertinent family history.  Social History:  reports that he quit smoking about 12 years ago. His smoking use included cigarettes. He has never used smokeless tobacco. He reports current alcohol use. He reports that he does not currently use drugs.  Physical Exam Blood pressure (!) 98/58, pulse 74, temperature 97.6 F (36.4 C), temperature source Oral, resp. rate 19, height 6\' 4"  (1.93 m), weight 129.3 kg, SpO2 97 %. General: No acute distress.  Alert and oriented. Right leg: Right knee wound with white sponge over wound VAC.  Incisional VAC extending inferiorly.  No surrounding erythema.  No obvious purulence or signs of infection today.  Has a well-perfused and sensate foot. Pictures from the operating room were reviewed which show what looks like around an 8 x 8 cm skin defect with exposed patella and potentially quadricep tendon.  Results for orders placed or performed during the hospital encounter of 03/11/21 (from the past 48 hour(s))  Aerobic/Anaerobic Culture w Gram Stain (surgical/deep wound)     Status: None (Preliminary result)   Collection Time: 03/18/21  3:22 PM   Specimen: PATH Cytology Misc. fluid; Body Fluid  Result Value Ref Range   Specimen Description      WOUND Performed at [facility] [address], [place], [place] [address]    Special Requests      NONE Performed at [facility], [place] [address] [place], [place] [address]    Gram Stain      RARE WBC PRESENT,BOTH PMN AND MONONUCLEAR NO ORGANISMS SEEN    Culture      NO GROWTH < 24  HOURS Performed at [facility] [address] [place], [place] [address]    Report Status PENDING   Basic metabolic panel     Status: Abnormal   Collection Time: 03/19/21  4:25 AM  Result Value Ref Range   Sodium 138 135 - 145 mmol/L   Potassium 4.0 3.5 - 5.1 mmol/L   Chloride 105 98 - 111 mmol/L   CO2 25 22 - 32 mmol/L   Glucose, Bld 117 (H) 70 - 99 mg/dL    Comment: Glucose reference range applies only to samples taken after fasting for at least 8 hours.   BUN 13 8 - 23 mg/dL   Creatinine, Ser 0.74 0.61 - 1.24 mg/dL   Calcium 8.5 (L) 8.9 - 10.3 mg/dL   GFR, Estimated >60 >60 mL/min    Comment: (NOTE) Calculated using the CKD-EPI Creatinine Equation (2021)    Anion gap  8 5 - 15    Comment: Performed at [facility], [place] [address] [place], [place] [address]  CBC     Status: Abnormal   Collection Time: 03/19/21  4:25 AM  Result Value Ref Range   WBC 9.0 4.0 - 10.5 K/uL   RBC 3.71 (L) 4.22 - 5.81 MIL/uL   Hemoglobin 10.2 (L) 13.0 - 17.0 g/dL   HCT 33.2 (L) 39.0 - 52.0 %   MCV 89.5 80.0 - 100.0 fL   MCH 27.5 26.0 - 34.0 pg   MCHC 30.7 30.0 - 36.0 g/dL   RDW 15.6 (H) 11.5 - 15.5 %   Platelets 287 150 - 400 K/uL   nRBC 0.0 0.0 - 0.2 %    Comment: Performed at [facility], [place] [address] [place], [place] [address]    No results found.  Assessment/Plan: Patient presents with a complex right knee wound.  Had a long discussion with him today about his options.  I did offer to place Integra over the exposed patella and quadricep tendon followed by wound VAC.  I did explain that there was a chance that this would fail to provide adequate soft tissue coverage and ultimately he might require flap coverage.  In my mind the medial gastrocnemius flap is unlikely to cover the open wound at and superior to the patella.  He would probably be a reasonable candidate for a reverse ALT flap but in my opinion that would be best done in a university  setting.  He would like to take some time to consider his options which I support.  We will plan to check back with him once he has had some time to think about it.  Cindra Presume 03/19/2021, 5:54 PM

## 2021-03-19 NOTE — H&P (View-Only) (Signed)
Reason for Consult/CC: Right knee wound  Harry [name] is an 71 y.o. male.  HPI: I was consulted for assistance with reconstruction of the patient's right knee wound.  He had a knee replacement back in July and shortly thereafter sounds like he had a rupture of his patellar tendon and MCL injury while doing physical therapy.  This prompted a revision arthroplasty and repair of his knee extensor mechanism which was done over 6 weeks ago.  He was casted for most of that time.  He unfortunately developed some skin necrosis on and superior to the patella.  Dr. Lyla Glassing took him back for debridement yesterday and did some additional repairs to the extensor mechanism and joint capsule.  I am consulted for help with coverage of the knee joint.  Also of note cultures were taken yesterday which show no growth as of yet.  Additionally patient said he injured his right knee many years ago playing football but was able to have it repaired and suspects this is the reason why it has been a complicated process with the total knee arthroplasty.  Allergies: No Known Allergies  Medications:  Current Facility-Administered Medications:    (feeding supplement) PROSource Plus liquid 30 mL, 30 mL, Oral, BID BM, Patrecia Pour, MD, 30 mL at 03/19/21 1625   acetaminophen (TYLENOL) tablet 325-650 mg, 325-650 mg, Oral, Q6H PRN, Rod Can, MD, 650 mg at 03/19/21 1625   atorvastatin (LIPITOR) tablet 20 mg, 20 mg, Oral, Q1500, Swinteck, Aaron Edelman, MD, 20 mg at 03/19/21 1626   cefTRIAXone (ROCEPHIN) 2 g in sodium chloride 0.9 % 100 mL IVPB, 2 g, Intravenous, Q24H, Lenis Noon, RPH, Last Rate: 200 mL/hr at 03/19/21 1043, 2 g at 03/19/21 1043   docusate sodium (COLACE) capsule 100 mg, 100 mg, Oral, BID, Swinteck, Aaron Edelman, MD   enoxaparin (LOVENOX) injection 120 mg, 120 mg, Subcutaneous, Q12H, Lenis Noon, RPH, 120 mg at 03/19/21 1037   melatonin tablet 3 mg, 3 mg, Oral, QHS PRN, Swinteck, Aaron Edelman, MD   metoCLOPramide (REGLAN)  tablet 5-10 mg, 5-10 mg, Oral, Q8H PRN **OR** metoCLOPramide (REGLAN) injection 5-10 mg, 5-10 mg, Intravenous, Q8H PRN, Swinteck, Aaron Edelman, MD   metoprolol tartrate (LOPRESSOR) tablet 25 mg, 25 mg, Oral, BID, Swinteck, Aaron Edelman, MD, 25 mg at 03/19/21 1035   ondansetron (ZOFRAN) tablet 4 mg, 4 mg, Oral, Q6H PRN **OR** ondansetron (ZOFRAN) injection 4 mg, 4 mg, Intravenous, Q6H PRN, Swinteck, Aaron Edelman, MD   pantoprazole (PROTONIX) EC tablet 40 mg, 40 mg, Oral, Daily, Swinteck, Brian, MD, 40 mg at 03/19/21 1036   senna (SENOKOT) tablet 8.6 mg, 1 tablet, Oral, BID, Swinteck, Aaron Edelman, MD   vancomycin (VANCOREADY) IVPB 1250 mg/250 mL, 1,250 mg, Intravenous, Q12H, Lenis Noon, RPH, Last Rate: 166.7 mL/hr at 03/19/21 1636, 1,250 mg at 03/19/21 1636   vitamin B-12 (CYANOCOBALAMIN) tablet 1,000 mcg, 1,000 mcg, Oral, Daily, Swinteck, Aaron Edelman, MD, 1,000 mcg at 03/19/21 1035  Past Medical History:  Diagnosis Date   Arthritis    Cancer ([place])    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   Dry mouth    GERD (gastroesophageal reflux disease)    Hypertension    taking blood pressure medication as needed   Macular degeneration    Bilateral   PONV (postoperative nausea and vomiting)    Tendonitis    Wound healing, delayed    bottom and right heel currently being treated    Past Surgical History:  Procedure Laterality Date   I & D EXTREMITY Right 03/18/2021  Procedure: IRRIGATION AND DEBRIDEMENT knee placement wound vac;  Surgeon: Rod Can, MD;  [facility];  Service: Orthopedics;  Laterality: Right;   KNEE ARTHROPLASTY Right 01/07/2021   Procedure: COMPUTER ASSISTED TOTAL KNEE ARTHROPLASTY;  Surgeon: Rod Can, MD;  [facility];  Service: Orthopedics;  Laterality: Right;   KNEE CLOSED REDUCTION Right 01/20/2021   Procedure: CLOSED MANIPULATION KNEE;  Surgeon: Nicholes Stairs, MD;  [facility];  Service: Orthopedics;  Laterality: Right;   KNEE SURGERY Right    NOSE SURGERY     ORIF TIBIA PLATEAU  Right 01/21/2021   Procedure: PATELLA TENDON REPAIR;  Surgeon: Hiram Gash, MD;  [facility];  Service: Orthopedics;  Laterality: Right;   TOTAL KNEE ARTHROPLASTY Right 01/21/2021   Procedure: TOTAL KNEE REVISION BOTH COMPONENTS;  Surgeon: Rod Can, MD;  [facility];  Service: Orthopedics;  Laterality: Right;   WISDOM TOOTH EXTRACTION      History reviewed. No pertinent family history.  Social History:  reports that he quit smoking about 12 years ago. His smoking use included cigarettes. He has never used smokeless tobacco. He reports current alcohol use. He reports that he does not currently use drugs.  Physical Exam Blood pressure (!) 98/58, pulse 74, temperature 97.6 F (36.4 C), temperature source Oral, resp. rate 19, height 6\' 4"  (1.93 m), weight 129.3 kg, SpO2 97 %. General: No acute distress.  Alert and oriented. Right leg: Right knee wound with white sponge over wound VAC.  Incisional VAC extending inferiorly.  No surrounding erythema.  No obvious purulence or signs of infection today.  Has a well-perfused and sensate foot. Pictures from the operating room were reviewed which show what looks like around an 8 x 8 cm skin defect with exposed patella and potentially quadricep tendon.  Results for orders placed or performed during the hospital encounter of 03/11/21 (from the past 48 hour(s))  Aerobic/Anaerobic Culture w Gram Stain (surgical/deep wound)     Status: None (Preliminary result)   Collection Time: 03/18/21  3:22 PM   Specimen: PATH Cytology Misc. fluid; Body Fluid  Result Value Ref Range   Specimen Description      WOUND Performed at [facility] [address], [place], [place] [address]    Special Requests      NONE Performed at [facility], [place] [address], [place], [place] [address]    Gram Stain      RARE WBC PRESENT,BOTH PMN AND MONONUCLEAR NO ORGANISMS SEEN    Culture      NO GROWTH < 24  HOURS Performed at [facility] [address], [place],  [address]    Report Status PENDING   Basic metabolic panel     Status: Abnormal   Collection Time: 03/19/21  4:25 AM  Result Value Ref Range   Sodium 138 135 - 145 mmol/L   Potassium 4.0 3.5 - 5.1 mmol/L   Chloride 105 98 - 111 mmol/L   CO2 25 22 - 32 mmol/L   Glucose, Bld 117 (H) 70 - 99 mg/dL    Comment: Glucose reference range applies only to samples taken after fasting for at least 8 hours.   BUN 13 8 - 23 mg/dL   Creatinine, Ser 0.74 0.61 - 1.24 mg/dL   Calcium 8.5 (L) 8.9 - 10.3 mg/dL   GFR, Estimated >60 >60 mL/min    Comment: (NOTE) Calculated using the CKD-EPI Creatinine Equation (2021)    Anion gap  8 5 - 15    Comment: Performed at [facility], [place] [address] [place], [place] [address]  CBC     Status: Abnormal   Collection Time: 03/19/21  4:25 AM  Result Value Ref Range   WBC 9.0 4.0 - 10.5 K/uL   RBC 3.71 (L) 4.22 - 5.81 MIL/uL   Hemoglobin 10.2 (L) 13.0 - 17.0 g/dL   HCT 33.2 (L) 39.0 - 52.0 %   MCV 89.5 80.0 - 100.0 fL   MCH 27.5 26.0 - 34.0 pg   MCHC 30.7 30.0 - 36.0 g/dL   RDW 15.6 (H) 11.5 - 15.5 %   Platelets 287 150 - 400 K/uL   nRBC 0.0 0.0 - 0.2 %    Comment: Performed at [facility], [place] [address], [place], [place] [address]    No results found.  Assessment/Plan: Patient presents with a complex right knee wound.  Had a long discussion with him today about his options.  I did offer to place Integra over the exposed patella and quadricep tendon followed by wound VAC.  I did explain that there was a chance that this would fail to provide adequate soft tissue coverage and ultimately he might require flap coverage.  In my mind the medial gastrocnemius flap is unlikely to cover the open wound at and superior to the patella.  He would probably be a reasonable candidate for a reverse ALT flap but in my opinion that would be best done in a university  setting.  He would like to take some time to consider his options which I support.  We will plan to check back with him once he has had some time to think about it.  Cindra Presume 03/19/2021, 5:54 PM

## 2021-03-19 NOTE — Evaluation (Signed)
Physical Therapy Evaluation Patient Details Name: Harry [name] MRN: [id number] DOB: 09-09-1950 Today's Date: 03/19/2021  History of Present Illness  Pt is a 70 year old male and had ortho follow-up on 9/20, his wound was found to be fully demarcated and was therefore indicated for debridement.  He was at the hospital for elective irrigation and debridement of right knee.  Patient was intubated for the procedure.  However upon induction of anesthesia, before the procedure had started, patient developed hypotension down to 60s in A. fib with RVR as well. Pt s/p Excisional debridement of skin and subcutaneous tissue right knee and Placement of negative pressure wound dressing on 03/18/21.   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  Pt with hx of arthritis, HTN, and obesity  Clinical Impression  Patient is s/p above surgery resulting in functional limitations due to the deficits listed below (see PT Problem List). Patient will benefit from skilled PT to increase their independence and safety with mobility to allow discharge to the venue listed below.  Pt s/p I&D right knee and NPWT placement.  Pt has not been OOB since admission 8 days ago.  Pt aware of no right knee ROM, maintaining KI and able to WBAT with walker.  Pt assisted with standing and pivoting to recliner today.  Pt anticipates d/c home (had previously been in rehab and then discharged home with HHPT) and does have RW and w/c for mobilizing.      Recommendations for follow up therapy are one component of a multi-disciplinary discharge planning process, led by the attending physician.  Recommendations may be updated based on patient status, additional functional criteria and insurance authorization.  Follow Up Recommendations Home health PT;Supervision for mobility/OOB (pt was receiving HHPT prior to admission)    Equipment Recommendations  None recommended by PT    Recommendations for Other  Services       Precautions / Restrictions Precautions Precautions: Knee;Fall Required Braces or Orthoses: Knee Immobilizer - Right Knee Immobilizer - Right: On at all times Restrictions RLE Weight Bearing: Weight bearing as tolerated Other Position/Activity Restrictions: with KI and Walker      Mobility  Bed Mobility Overal bed mobility: Needs Assistance Bed Mobility: Supine to Sit     Supine to sit: HOB elevated;Min guard     General bed mobility comments: min/guard for lines    Transfers Overall transfer level: Needs assistance Equipment used: Rolling walker (2 wheeled) Transfers: Sit to/from [facility] Sit to Stand: Min assist;From elevated surface Stand pivot transfers: Min assist       General transfer comment: verbal cues for weight shifting, pt tends to "plop" when returning to sitting however always makes sure locked surface behind him  Ambulation/Gait             General Gait Details: pt politely declined first time OOB  Stairs            Wheelchair Mobility    Modified Rankin (Stroke Patients Only)       Balance                                             Pertinent Vitals/Pain Pain Assessment: 0-10 Pain Score: 4  Pain Location: right knee Pain Descriptors / Indicators: Sore;Aching Pain Intervention(s): Monitored during session;Repositioned    Home Living  Family/patient expects to be discharged to:: Private residence Living Arrangements: Spouse/significant other Available Help at Discharge: Family;Available 24 hours/day Type of Home: House Home Access: Ramped entrance     Home Layout: One level Home Equipment: Wheelchair - manual;Other (comment);Bedside commode;Walker - 2 wheels;Cane - single point (lift chair)      Prior Function Level of Independence: Independent with assistive device(s)         Comments: ambulatory with RW once home from rehab     Hand Dominance         Extremity/Trunk Assessment        Lower Extremity Assessment Lower Extremity Assessment: RLE deficits/detail RLE Deficits / Details: maintained knee extension in KI       Communication   Communication: No difficulties  Cognition Arousal/Alertness: Awake/alert Behavior During Therapy: WFL for tasks assessed/performed Overall Cognitive Status: Within Functional Limits for tasks assessed                                        General Comments      Exercises     Assessment/Plan    PT Assessment Patient needs continued PT services  PT Problem List Decreased activity tolerance;Decreased strength;Decreased mobility;Decreased range of motion;Obesity;Decreased knowledge of use of DME       PT Treatment Interventions Gait training;DME instruction;Therapeutic exercise;Balance training;Functional mobility training;Therapeutic activities;Patient/family education;Wheelchair mobility training    PT Goals (Current goals can be found in the Care Plan section)  Acute Rehab PT Goals PT Goal Formulation: With patient Time For Goal Achievement: 04/02/21 Potential to Achieve Goals: Good    Frequency 7X/week   Barriers to discharge        Co-evaluation               AM-PAC PT "6 Clicks" Mobility  Outcome Measure Help needed turning from your back to your side while in a flat bed without using bedrails?: A Little Help needed moving from lying on your back to sitting on the side of a flat bed without using bedrails?: A Little Help needed moving to and from a bed to a chair (including a wheelchair)?: A Little Help needed standing up from a chair using your arms (e.g., wheelchair or bedside chair)?: A Little Help needed to walk in hospital room?: A Lot Help needed climbing 3-5 steps with a railing? : A Lot 6 Click Score: 16    End of Session Equipment Utilized During Treatment: Gait belt;Right knee immobilizer Activity Tolerance: Patient tolerated treatment  well Patient left: in chair;with call bell/phone within reach;with chair alarm set Nurse Communication: Mobility status PT Visit Diagnosis: Other abnormalities of gait and mobility (R26.89)    Time: 6967-8938 PT Time Calculation (min) (ACUTE ONLY): 24 min   Charges:   PT Evaluation $PT Eval Low Complexity: 1 Low        Kati PT, DPT Acute Rehabilitation Services Pager: [phone] Office: [place] 03/19/2021, 1:38 PM

## 2021-03-19 NOTE — Progress Notes (Signed)
PROGRESS NOTE  Harry [name]  ZOX:[id number] DOB: 08/02/1950 DOA: 03/11/2021 PCP: Buzzy Han, MD   Brief Narrative: Harry [name] is a 70 y.o. male with a history of HTN, COPD, previous tobacco use, lymphedema of legs, and severe right knee arthritis s/p TKA 01/07/2021 subsequently discharged to SNF and suffered anterior knee hardware dislocation on 8/1 that was successfully reduced in the ED. Unfortunately this recurred the following day with persistent patella alta on post reduction films at that time. On 8/3 he underwent revision to TKA, reconstruction of extensor mechanism and MCL. At his immediate postop visit, he was found to have some questionable skin flap viability. He was treated for a total of 6 weeks in a cylinder cast and had wound checks at regular intervals. On 9/14, he was discharged from rehab to home. On follow-up on 9/20, his wound was found to be fully demarcated and was therefore indicated for debridement. Next day on 9/21 he was brought the hospital for elective irrigation and debridement of right knee, though suffered hypotension and AFib with RVR upon induction of anesthesia prompting admission, cancellation of surgery. Metoprolol and lovenox started with improved rate control. Ultimately he underwent excisional debridement and placement of wound vac on 9/28 by Dr. Lyla Glassing. He continues on vancomycin and ceftriaxone.  Assessment & Plan: Principal Problem:   Necrosis of surgical wound ([place]) Active Problems:   Paroxysmal atrial fibrillation (HCC)   Tachycardia   Pressure injury of skin  Paroxysmal atrial flutter with RVR: Echo with preserved LVEF, G1DD, mild aortic root dilatation. TSH 1.043.  - Continue metoprolol 25mg  po BID,  - Continue therapeutic lovenox (reinitiated 9/29 per ortho). Will switch to eliquis at DC for CHA2DS2-VASc score of 3 (age, HTN, atherosclerosis).  - Continue telemetry monitoring, pt does have PVCs and has had since July at  least. - Recommend outpatient sleep study - Has cardiology follow up arranged 10/14.   Right knee arthroplasty, dislocation, patella alta: s/p repair of extensor mechanism and revision. Wound necrosis complicating healing now s/p I&D, wound vac placement 9/28.  - Continue vancomycin, ceftriaxone pending operative cultures.  - Plastic surgery, Dr. Claudia Desanctis, consulted by orthopedics for recommendations Re: closure. - Wound vac management per orthopedics.  - WBAT RLE with knee immobilizer in place at all times - Continue tylenol for pain control. Pt refuses any opioids due to hx delirium from them. Requests the prn dosing be taken off MAR altogether which I've done.  - Protein supplementation ordered to aid in wound healing.   Hypotension: Associated with anesthesia, RVR, since resolved.    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 Prominent eustachian valve was noted.  - Follow up per cardiology.    Chronic bilateral lower extremity lymphedema: Limited venous U/S showed no DVT. No PE on CTA chest.   COPD: Stable. No medications needed at this time. Wish to avoid beta agonists to avoid provocation of atrial dysrhythmia.  HLD:  -Continue statin. LDL is 39.  Nonobstructive CAD: 2v disease w/calcifications by CT. No anginal complaints.  - Continue statin. ASA substituted with anticoagulation. Also on beta blocker.   Anemia Vitamin B12 deficiency. -Patient had normal hemoglobin over 12 till the knee arthroplasty June 2022.  Since then his hemoglobin level has been running less than 9 mostly.  No evidence of active bleeding.  Hemoglobin currently between between 9 and 10.  Ferritin level appropriate at 66, vitamin B12 level at the lower limit of normal  at 274.  Oral vitamin B12 has been started.  Insomnia - Melatonin as needed at bedtime.  RN Pressure Injury Documentation: Pressure Injury 03/11/21 Sacrum Mid Stage 3 -  Full  thickness tissue loss. Subcutaneous fat may be visible but bone, tendon or muscle are NOT exposed. Wound is 4.5cmX3.5cm     it is 80% red and about 20% white (Active)  03/11/21 2020  Location: Sacrum  Location Orientation: Mid  Staging: Stage 3 -  Full thickness tissue loss. Subcutaneous fat may be visible but bone, tendon or muscle are NOT exposed.  Wound Description (Comments): Wound is 4.5cmX3.5cm     it is 80% red and about 20% white  Present on Admission: Yes     Pressure Injury 03/11/21 Heel Right Unstageable - Full thickness tissue loss in which the base of the injury is covered by slough (yellow, tan, gray, green or brown) and/or eschar (tan, brown or black) in the wound bed. Wound is 90% black and 10% red (Active)  03/11/21 2040  Location: Heel  Location Orientation: Right  Staging: Unstageable - Full thickness tissue loss in which the base of the injury is covered by slough (yellow, tan, gray, green or brown) and/or eschar (tan, brown or black) in the wound bed.  Wound Description (Comments): Wound is 90% black and 10% red  Present on Admission: Yes   Obesity: Estimated body mass index is 34.69 kg/m as calculated from the following:   Height as of this encounter: 6\' 4"  (1.93 m).   Weight as of this encounter: 129.3 kg.  DVT prophylaxis: Lovenox 1mg /kg q12h Code Status: Full Family Communication: Wife at bedside Disposition Plan:  Status is: Inpatient  Remains inpatient appropriate because:Inpatient level of care appropriate due to severity of illness  Dispo: The patient is from: Home              Anticipated d/c is to: Home              Patient currently is not medically stable to d/c.  Consultants:  Orthopedics Cardiology Plastics  Procedures:  03/18/21 IRRIGATION AND DEBRIDEMENT knee placement wound vac Swinteck, Aaron Edelman, MD   Antimicrobials: Ceftriaxone 9/26 >> Vancomycin 9/28 >>   Subjective: Pain controlled with tylenol alone. No dyspnea or chest pain or  palpitations.   Objective: Vitals:   03/18/21 1800 03/18/21 1953 03/19/21 0406 03/19/21 0750  BP: 117/77 117/72 106/73 107/62  Pulse: 76 79 70 76  Resp: 18  18 19   Temp: 97.9 F (36.6 C) 97.9 F (36.6 C) (!) 97.4 F (36.3 C) 98 F (36.7 C)  TempSrc: Oral Oral Oral Oral  SpO2: 92% 95% 99% 98%  Weight:      Height:        Intake/Output Summary (Last 24 hours) at 03/19/2021 0845 Last data filed at 03/19/2021 0753 Gross per 24 hour  Intake 1925.08 ml  Output 1250 ml  Net 675.08 ml   Filed Weights   03/10/21 1510  Weight: 129.3 kg   Gen: 70 y.o. male in no distress Pulm: Non-labored breathing room air. Clear to auscultation bilaterally.  CV: Regular rate and rhythm. No murmur, rub, or gallop. No definite JVD, + R > L pedal edema. GI: Abdomen soft, non-tender, non-distended, with normoactive bowel sounds. No organomegaly or masses felt. Ext: Warm, right knee immobilizer in place. Right foot/toes warm, dry, brisk cap refill, SILT, motor function preserved. Skin: No rashes, lesions or ulcers elsewhere noted Neuro: Alert and oriented. No focal neurological deficits. Psych:  Judgement and insight appear normal. Mood & affect appropriate.   Data Reviewed: I have personally reviewed following labs and imaging studies  CBC: Recent Labs  Lab 03/13/21 0431 03/14/21 0444 03/15/21 0428 03/19/21 0425  WBC 7.1 6.8 7.5 9.0  HGB 8.8* 9.0* 9.7* 10.2*  HCT 28.9* 28.8* 30.9* 33.2*  MCV 90.6 90.0 89.3 89.5  PLT 262 262 269 390   Basic Metabolic Panel: Recent Labs  Lab 03/13/21 0431 03/14/21 0444 03/19/21 0425  NA 136 140 138  K 3.8 3.9 4.0  CL 103 110 105  CO2 24 23 25   GLUCOSE 97 94 117*  BUN 9 10 13   CREATININE 0.90 0.87 0.74  CALCIUM 8.1* 8.6* 8.5*  MG 2.2 2.0  --    GFR: Estimated Creatinine Clearance: 127.9 mL/min (by C-G formula based on SCr of 0.74 mg/dL). Liver Function Tests: No results for input(s): AST, ALT, ALKPHOS, BILITOT, PROT, ALBUMIN in the last 168  hours. No results for input(s): LIPASE, AMYLASE in the last 168 hours. No results for input(s): AMMONIA in the last 168 hours. Coagulation Profile: No results for input(s): INR, PROTIME in the last 168 hours. Cardiac Enzymes: No results for input(s): CKTOTAL, CKMB, CKMBINDEX, TROPONINI in the last 168 hours. BNP (last 3 results) No results for input(s): PROBNP in the last 8760 hours. HbA1C: No results for input(s): HGBA1C in the last 72 hours. CBG: No results for input(s): GLUCAP in the last 168 hours. Lipid Profile: No results for input(s): CHOL, HDL, LDLCALC, TRIG, CHOLHDL, LDLDIRECT in the last 72 hours. Thyroid Function Tests: No results for input(s): TSH, T4TOTAL, FREET4, T3FREE, THYROIDAB in the last 72 hours. Anemia Panel: No results for input(s): VITAMINB12, FOLATE, FERRITIN, TIBC, IRON, RETICCTPCT in the last 72 hours. Urine analysis:    Component Value Date/Time   COLORURINE YELLOW 12/26/2020 0923   APPEARANCEUR HAZY (A) 12/26/2020 0923   LABSPEC 1.020 12/26/2020 0923   PHURINE 5.0 12/26/2020 0923   GLUCOSEU NEGATIVE 12/26/2020 0923   HGBUR NEGATIVE 12/26/2020 0923   BILIRUBINUR NEGATIVE 12/26/2020 0923   KETONESUR 5 (A) 12/26/2020 0923   PROTEINUR NEGATIVE 12/26/2020 0923   NITRITE NEGATIVE 12/26/2020 0923   LEUKOCYTESUR SMALL (A) 12/26/2020 0923   Recent Results (from the past 240 hour(s))  SARS Coronavirus 2 by RT PCR (hospital order, performed in [facility] hospital lab) Nasopharyngeal Nasopharyngeal Swab     Status: None   Collection Time: 03/11/21 12:02 PM   Specimen: Nasopharyngeal Swab  Result Value Ref Range Status   SARS Coronavirus 2 NEGATIVE NEGATIVE Final    Comment: (NOTE) SARS-CoV-2 target nucleic acids are NOT DETECTED.  The SARS-CoV-2 RNA is generally detectable in upper and lower respiratory specimens during the acute phase of infection. The lowest concentration of SARS-CoV-2 viral copies this assay can detect is 250 copies / mL. A negative  result does not preclude SARS-CoV-2 infection and should not be used as the sole basis for treatment or other patient management decisions.  A negative result may occur with improper specimen collection / handling, submission of specimen other than nasopharyngeal swab, presence of viral mutation(s) within the areas targeted by this assay, and inadequate number of viral copies (<250 copies / mL). A negative result must be combined with clinical observations, patient history, and epidemiological information.  Fact Sheet for Patients:   StrictlyIdeas.no  Fact Sheet for Healthcare Providers: BankingDealers.co.za  This test is not yet approved or  cleared by the [place] FDA and has been authorized for detection and/or diagnosis  of SARS-CoV-2 by FDA under an Emergency Use Authorization (EUA).  This EUA will remain in effect (meaning this test can be used) for the duration of the COVID-19 declaration under Section 564(b)(1) of the Act, 21 U.S.C. section 360bbb-3(b)(1), unless the authorization is terminated or revoked sooner.  Performed at [facility], [place] [address], [place], [place] [address]   Surgical PCR screen     Status: None   Collection Time: 03/17/21 10:39 AM   Specimen: Nasal Mucosa; Nasal Swab  Result Value Ref Range Status   MRSA, PCR NEGATIVE NEGATIVE Final   Staphylococcus aureus NEGATIVE NEGATIVE Final    Comment: (NOTE) The Xpert SA Assay (FDA approved for NASAL specimens in patients 30 years of age and older), is one component of a comprehensive surveillance program. It is not intended to diagnose infection nor to guide or monitor treatment. Performed at [facility], [place] [address] [place], [place] [address]   Aerobic/Anaerobic Culture w Gram Stain (surgical/deep wound)     Status: None (Preliminary result)   Collection Time: 03/18/21  3:22 PM   Specimen: PATH Cytology  Misc. fluid; Body Fluid  Result Value Ref Range Status   Specimen Description   Final    WOUND Performed at [place] [address], [place], [place] [address]    Special Requests   Final    NONE Performed at [facility], [place] [address] [place], [place] [address]    Gram Stain   Final    RARE WBC PRESENT,BOTH PMN AND MONONUCLEAR NO ORGANISMS SEEN Performed at [facility], [place] [address], [place],  [address]    Culture PENDING  Incomplete   Report Status PENDING  Incomplete      Radiology Studies: No results found.  Scheduled Meds:  (feeding supplement) PROSource Plus  30 mL Oral BID BM   atorvastatin  20 mg Oral Q1500   docusate sodium  100 mg Oral BID   enoxaparin (LOVENOX) injection  120 mg Subcutaneous Q12H   metoprolol tartrate  25 mg Oral BID   pantoprazole  40 mg Oral Daily   senna  1 tablet Oral BID   vitamin B-12  1,000 mcg Oral Daily   Continuous Infusions:  cefTRIAXone (ROCEPHIN)  IV     vancomycin 1,250 mg (03/19/21 0409)     LOS: 6 days   Time spent: 25 minutes.  Harry Pour, MD [facility] [email or web address] 03/19/2021, 8:45 AM

## 2021-03-20 ENCOUNTER — Telehealth: Payer: Self-pay | Admitting: Plastic Surgery

## 2021-03-20 ENCOUNTER — Other Ambulatory Visit (HOSPITAL_COMMUNITY): Payer: Self-pay

## 2021-03-20 DIAGNOSIS — R000 Tachycardia, unspecified: Secondary | ICD-10-CM | POA: Diagnosis not present

## 2021-03-20 DIAGNOSIS — I9789 Other postprocedural complications and disorders of the circulatory system, not elsewhere classified: Secondary | ICD-10-CM | POA: Diagnosis not present

## 2021-03-20 DIAGNOSIS — I96 Gangrene, not elsewhere classified: Secondary | ICD-10-CM | POA: Diagnosis not present

## 2021-03-20 DIAGNOSIS — I480 Paroxysmal atrial fibrillation: Secondary | ICD-10-CM | POA: Diagnosis not present

## 2021-03-20 LAB — CBC
HCT: 30.3 % — ABNORMAL LOW (ref 39.0–52.0)
Hemoglobin: 9.2 g/dL — ABNORMAL LOW (ref 13.0–17.0)
MCH: 27.3 pg (ref 26.0–34.0)
MCHC: 30.4 g/dL (ref 30.0–36.0)
MCV: 89.9 fL (ref 80.0–100.0)
Platelets: 265 10*3/uL (ref 150–400)
RBC: 3.37 MIL/uL — ABNORMAL LOW (ref 4.22–5.81)
RDW: 16 % — ABNORMAL HIGH (ref 11.5–15.5)
WBC: 6.9 10*3/uL (ref 4.0–10.5)
nRBC: 0 % (ref 0.0–0.2)

## 2021-03-20 MED ORDER — IBUPROFEN 200 MG PO TABS
400.0000 mg | ORAL_TABLET | Freq: Once | ORAL | Status: AC
  Administered 2021-03-20: 400 mg via ORAL
  Filled 2021-03-20: qty 2

## 2021-03-20 NOTE — Progress Notes (Signed)
PROGRESS NOTE  BLAKELEY MARGRAF  CBJ:[id number] DOB: Mar 24, 1951 DOA: 03/11/2021 PCP: Buzzy Han, MD   Brief Narrative: PAGE LANCON is a 70 y.o. male with a history of HTN, COPD, previous tobacco use, lymphedema of legs, and severe right knee arthritis s/p TKA 01/07/2021 subsequently discharged to SNF and suffered anterior knee hardware dislocation on 8/1 that was successfully reduced in the ED. Unfortunately this recurred the following day with persistent patella alta on post reduction films at that time. On 8/3 he underwent revision to TKA, reconstruction of extensor mechanism and MCL. At his immediate postop visit, he was found to have some questionable skin flap viability. He was treated for a total of 6 weeks in a cylinder cast and had wound checks at regular intervals. On 9/14, he was discharged from rehab to home. On follow-up on 9/20, his wound was found to be fully demarcated and was therefore indicated for debridement. Next day on 9/21 he was brought the hospital for elective irrigation and debridement of right knee, though suffered hypotension and AFib with RVR upon induction of anesthesia prompting admission, cancellation of surgery. Metoprolol and lovenox started with improved rate control. Ultimately he underwent excisional debridement and placement of wound vac on 9/28 by Dr. Lyla Glassing. He continues on vancomycin and ceftriaxone.  Assessment & Plan: Principal Problem:   Necrosis of surgical wound ([place]) Active Problems:   Paroxysmal atrial fibrillation (HCC)   Tachycardia   Pressure injury of skin  Paroxysmal atrial flutter with RVR: Echo with preserved LVEF, G1DD, mild aortic root dilatation. TSH 1.043.  - Continue metoprolol 25mg  po BID,  - Continue therapeutic lovenox (reinitiated 9/29 per ortho). Will switch to eliquis at DC for CHA2DS2-VASc score of 3 (age, HTN, atherosclerosis).  - Continue telemetry monitoring, pt does have PVCs and has had since July at  least. - Recommend outpatient sleep study - Has cardiology follow up arranged 10/14.   Right knee arthroplasty, dislocation, patella alta: s/p repair of extensor mechanism and revision. Wound necrosis complicating healing now s/p I&D, wound vac placement 9/28.  - Continue vancomycin, ceftriaxone pending operative cultures. S. epidermidis thus far growing, unclear significance of this. - Plastic surgery, Dr. Claudia Desanctis, consulted by orthopedics. D/w him 9/29 who reviewed options with family. Ortho to speak at family meeting today.  - Wound vac management per orthopedics.  - WBAT RLE with knee immobilizer in place at all times - Continue tylenol for pain control.  - Protein supplementation ordered to aid in wound healing.   Hypotension: Associated with anesthesia, RVR, since resolved.    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 Prominent eustachian valve was noted.  - Follow up per cardiology.    Chronic bilateral lower extremity lymphedema: Limited venous U/S showed no DVT. No PE on CTA chest.   COPD: Stable. No medications needed at this time. Wish to avoid beta agonists to avoid provocation of atrial dysrhythmia.  HLD:  -Continue statin. LDL is 39.  Nonobstructive CAD: 2v disease w/calcifications by CT. No anginal complaints.  - Continue statin. ASA substituted with anticoagulation. Also on beta blocker.   Anemia Vitamin B12 deficiency. -Patient had normal hemoglobin over 12 till the knee arthroplasty June 2022.  Since then his hemoglobin level has been running less than 9 mostly.  No evidence of active bleeding.  Hemoglobin currently between between 9 and 10.  Ferritin level appropriate at 66, vitamin B12 level at the lower limit of normal at 274.  Oral vitamin B12 has been started.  Insomnia - Melatonin as needed at bedtime.  RN Pressure Injury Documentation: Pressure Injury 03/11/21 Sacrum Mid Stage 3 -  Full  thickness tissue loss. Subcutaneous fat may be visible but bone, tendon or muscle are NOT exposed. Wound is 4.5cmX3.5cm     it is 80% red and about 20% white (Active)  03/11/21 2020  Location: Sacrum  Location Orientation: Mid  Staging: Stage 3 -  Full thickness tissue loss. Subcutaneous fat may be visible but bone, tendon or muscle are NOT exposed.  Wound Description (Comments): Wound is 4.5cmX3.5cm     it is 80% red and about 20% white  Present on Admission: Yes   Obesity: Estimated body mass index is 34.69 kg/m as calculated from the following:   Height as of this encounter: 6\' 4"  (1.93 m).   Weight as of this encounter: 129.3 kg.  DVT prophylaxis: Lovenox 1mg /kg q12h Code Status: Full Family Communication: None at bedside Disposition Plan:  Status is: Inpatient  Remains inpatient appropriate because:Inpatient level of care appropriate due to severity of illness  Dispo: The patient is from: Home              Anticipated d/c is to: Home              Patient currently is not medically stable to d/c.  Consultants:  Orthopedics Cardiology Plastics  Procedures:  03/18/21 IRRIGATION AND DEBRIDEMENT knee placement wound vac Swinteck, Aaron Edelman, MD   Antimicrobials: Ceftriaxone 9/26 >> Vancomycin 9/28 >>   Subjective: Requested additional pain medication last night, pain controlled since then. Weighing options, planning to speak with orthopedics this afternoon with wife all together.   Objective: Vitals:   03/19/21 2001 03/20/21 0502 03/20/21 0800 03/20/21 1342  BP: (!) 96/56 109/64 (!) 103/59 100/68  Pulse: 72 63 70 78  Resp: 20 20 18 20   Temp: 97.9 F (36.6 C) 97.7 F (36.5 C) 98.1 F (36.7 C) 98.3 F (36.8 C)  TempSrc: Oral  Oral Oral  SpO2: 97% 96%  95%  Weight:      Height:       Gen: 70 y.o. male in no distress Pulm: Nonlabored breathing room air. Clear. CV: Regular rate and rhythm. No murmur, rub, or gallop. No JVD, stable dependent edema. GI: Abdomen soft,  non-tender, non-distended, with normoactive bowel sounds.  Ext: Warm, no deformities. RLE in long knee immobilizer, foot neurovascularly intact. Skin: No new rashes, lesions or ulcers on visualized skin. Neuro: Alert and oriented. No focal neurological deficits. Psych: Judgement and insight appear fair. Mood euthymic & affect congruent. Behavior is appropriate.     Data Reviewed: I have personally reviewed following labs and imaging studies  CBC: Recent Labs  Lab 03/14/21 0444 03/15/21 0428 03/19/21 0425 03/20/21 0442  WBC 6.8 7.5 9.0 6.9  HGB 9.0* 9.7* 10.2* 9.2*  HCT 28.8* 30.9* 33.2* 30.3*  MCV 90.0 89.3 89.5 89.9  PLT 262 269 287 409   Basic Metabolic Panel: Recent Labs  Lab 03/14/21 0444 03/19/21 0425  NA 140 138  K 3.9 4.0  CL 110 105  CO2 23 25  GLUCOSE 94 117*  BUN 10 13  CREATININE 0.87 0.74  CALCIUM 8.6* 8.5*  MG 2.0  --    COVID PCR 9/21: Negative.  Aerobic/Anaerobic Culture w Gram Stain (surgical/deep wound)     Status: None (Preliminary result)   Collection Time: 03/18/21  3:22 PM   Specimen: PATH Cytology Misc. fluid; Body Fluid  Result Value Ref Range Status   Specimen Description   Final    WOUND Performed at [facility] [address], [place], [place] [address]    Special Requests   Final    NONE Performed at [facility], [place] [address], [place], [place] [address]    Gram Stain   Final    RARE WBC PRESENT,BOTH PMN AND MONONUCLEAR NO ORGANISMS SEEN    Culture   Final    RARE STAPHYLOCOCCUS EPIDERMIDIS SUSCEPTIBILITIES TO FOLLOW Performed at [facility], [place] [address] [place], [place] [address]    Report Status PENDING  Incomplete     Continuous Infusions:  cefTRIAXone (ROCEPHIN)  IV 2 g (03/20/21 1051)   vancomycin 1,250 mg (03/20/21 0504)     LOS: 7 days   Time spent: 25 minutes.  Patrecia Pour, MD [facility] [email or web address] 03/20/2021, 2:25 PM

## 2021-03-20 NOTE — TOC Progression Note (Addendum)
Transition of Care [facility]) - Progression Note    Patient Details  Name: Harry [name] MRN: [id number] Date of Birth: 1951/04/01  Transition of Care [facility]) CM/SW Contact  Ross Ludwig, [place] Phone Number: 03/20/2021, 6:10 PM  Clinical Narrative:     CSW continuing to follow patient's progress, will need home health set up once patient is closer to being medically ready for discharge.         Expected Discharge Plan and Services    Patient plans to discharge back home eventually.                                             Social Determinants of Health (SDOH) Interventions    Readmission Risk Interventions No flowsheet data found.

## 2021-03-20 NOTE — Progress Notes (Signed)
Subjective:  Patient reports pain as mild.  Denies N/V/CP/SOB. No c/o.  Objective:   VITALS:   Vitals:   03/19/21 2001 03/20/21 0502 03/20/21 0800 03/20/21 1342  BP: (!) 96/56 109/64 (!) 103/59 100/68  Pulse: 72 63 70 78  Resp: 20 20 18 20   Temp: 97.9 F (36.6 C) 97.7 F (36.5 C) 98.1 F (36.7 C) 98.3 F (36.8 C)  TempSrc: Oral  Oral Oral  SpO2: 97% 96%  95%  Weight:      Height:        NAD Neurovascular intact KI intact VAC intact   Lab Results  Component Value Date   WBC 6.9 03/20/2021   HGB 9.2 (L) 03/20/2021   HCT 30.3 (L) 03/20/2021   MCV 89.9 03/20/2021   PLT 265 03/20/2021   BMET    Component Value Date/Time   NA 138 03/19/2021 0425   K 4.0 03/19/2021 0425   CL 105 03/19/2021 0425   CO2 25 03/19/2021 0425   GLUCOSE 117 (H) 03/19/2021 0425   BUN 13 03/19/2021 0425   CREATININE 0.74 03/19/2021 0425   CALCIUM 8.5 (L) 03/19/2021 0425   GFRNONAA >60 03/19/2021 0425   Recent Results (from the past 240 hour(s))  SARS Coronavirus 2 by RT PCR (hospital order, performed in [facility] hospital lab) Nasopharyngeal Nasopharyngeal Swab     Status: None   Collection Time: 03/11/21 12:02 PM   Specimen: Nasopharyngeal Swab  Result Value Ref Range Status   SARS Coronavirus 2 NEGATIVE NEGATIVE Final    Comment: (NOTE) SARS-CoV-2 target nucleic acids are NOT DETECTED.  The SARS-CoV-2 RNA is generally detectable in upper and lower respiratory specimens during the acute phase of infection. The lowest concentration of SARS-CoV-2 viral copies this assay can detect is 250 copies / mL. A negative result does not preclude SARS-CoV-2 infection and should not be used as the sole basis for treatment or other patient management decisions.  A negative result may occur with improper specimen collection / handling, submission of specimen other than nasopharyngeal swab, presence of viral mutation(s) within the areas targeted by this assay, and inadequate number of viral  copies (<250 copies / mL). A negative result must be combined with clinical observations, patient history, and epidemiological information.  Fact Sheet for Patients:   StrictlyIdeas.no  Fact Sheet for Healthcare Providers: BankingDealers.co.za  This test is not yet approved or  cleared by the [place] FDA and has been authorized for detection and/or diagnosis of SARS-CoV-2 by FDA under an Emergency Use Authorization (EUA).  This EUA will remain in effect (meaning this test can be used) for the duration of the COVID-19 declaration under Section 564(b)(1) of the Act, 21 U.S.C. section 360bbb-3(b)(1), unless the authorization is terminated or revoked sooner.  Performed at [facility], [place] [address], [place], [place] [address]   Surgical PCR screen     Status: None   Collection Time: 03/17/21 10:39 AM   Specimen: Nasal Mucosa; Nasal Swab  Result Value Ref Range Status   MRSA, PCR NEGATIVE NEGATIVE Final   Staphylococcus aureus NEGATIVE NEGATIVE Final    Comment: (NOTE) The Xpert SA Assay (FDA approved for NASAL specimens in patients 42 years of age and older), is one component of a comprehensive surveillance program. It is not intended to diagnose infection nor to guide or monitor treatment. Performed at [facility], [place] [address], [place], [place] [address]   Aerobic/Anaerobic Culture w Gram Stain (surgical/deep wound)  Status: None (Preliminary result)   Collection Time: 03/18/21  3:22 PM   Specimen: PATH Cytology Misc. fluid; Body Fluid  Result Value Ref Range Status   Specimen Description   Final    WOUND Performed at [facility] [address] [place], [place] [address]    Special Requests   Final    NONE Performed at [facility], [place] [address], [place], [place] [address]    Gram Stain   Final    RARE WBC PRESENT,BOTH PMN AND  MONONUCLEAR NO ORGANISMS SEEN    Culture   Final    RARE STAPHYLOCOCCUS EPIDERMIDIS SUSCEPTIBILITIES TO FOLLOW Performed at [facility], [place] [address], [place], [place] [address]    Report Status PENDING  Incomplete      Assessment/Plan: 2 Days Post-Op   Principal Problem:   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, [place], [place], [place] [address] Phone: [phone] [email or web address] Facebook  [employer]

## 2021-03-20 NOTE — Progress Notes (Signed)
Physical Therapy Treatment Patient Details Name: Harry [name] MRN: [id number] DOB: [birth year] Today's Date: 03/20/2021   History of Present Illness Pt is a 70 year old male and had ortho follow-up on 9/20, his wound was found to be fully demarcated and was therefore indicated for debridement.  He was at the hospital for elective irrigation and debridement of right knee.  Patient was intubated for the procedure.  However upon induction of anesthesia, before the procedure had started, patient developed hypotension down to 60s in A. fib with RVR as well. Pt s/p Excisional debridement of skin and subcutaneous tissue right knee and Placement of negative pressure wound dressing on 03/18/21.   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  Pt with hx of arthritis, HTN, and obesity    PT Comments    POD # 2 Assisted OOB to amb.  Pt did well.   General bed mobility comments: increased time and self able.  General transfer comment: increased time but self able with assist to support R LE in extesion during stand to sit.  General Gait Details: initial instability and pt fearful but ended up amb 55 feet with walker. Positioned in recliner to comfort.   Pt plans to D/C to home.  Pt has a RAMP to enter.    Recommendations for follow up therapy are one component of a multi-disciplinary discharge planning process, led by the attending physician.  Recommendations may be updated based on patient status, additional functional criteria and insurance authorization.  Follow Up Recommendations  Home health PT;Supervision for mobility/OOB     Equipment Recommendations  None recommended by PT    Recommendations for Other Services       Precautions / Restrictions Precautions Precautions: Knee;Fall Required Braces or Orthoses: Knee Immobilizer - Right Knee Immobilizer - Right: On at all times Restrictions Weight Bearing Restrictions: No RLE Weight Bearing: Weight  bearing as tolerated Other Position/Activity Restrictions: with KI and Walker     Mobility  Bed Mobility Overal bed mobility: Needs Assistance Bed Mobility: Supine to Sit     Supine to sit: HOB elevated;Min guard     General bed mobility comments: increased time and self able    Transfers Overall transfer level: Needs assistance Equipment used: Rolling walker (2 wheeled) Transfers: Sit to/from [facility] Sit to Stand: Supervision Stand pivot transfers: Supervision;Min guard       General transfer comment: increased time but self able with assist to support R LE in extesion during stand to sit  Ambulation/Gait Ambulation/Gait assistance: Supervision;Min guard Gait Distance (Feet): 55 Feet Assistive device: Rolling walker (2 wheeled) Gait Pattern/deviations: Step-to pattern Gait velocity: decreased   General Gait Details: initial instability and pt fearful but ended up amb 55 feet with walker.   Stairs             Wheelchair Mobility    Modified Rankin (Stroke Patients Only)       Balance                                            Cognition Arousal/Alertness: Awake/alert Behavior During Therapy: WFL for tasks assessed/performed Overall Cognitive Status: Within Functional Limits for tasks assessed  General Comments: A x O  x 3 familiar from previous admit      Exercises      General Comments        Pertinent Vitals/Pain Pain Assessment: 0-10 Pain Score: 3  Pain Location: right knee Pain Descriptors / Indicators: Sore;Aching;Discomfort Pain Intervention(s): Monitored during session;Premedicated before session;Repositioned;Ice applied    Home Living                      Prior Function            PT Goals (current goals can now be found in the care plan section) Progress towards PT goals: Progressing toward goals    Frequency    7X/week       PT Plan Current plan remains appropriate    Co-evaluation              AM-PAC PT "6 Clicks" Mobility   Outcome Measure    Help needed moving from lying on your back to sitting on the side of a flat bed without using bedrails?: A Little Help needed moving to and from a bed to a chair (including a wheelchair)?: A Little Help needed standing up from a chair using your arms (e.g., wheelchair or bedside chair)?: A Little Help needed to walk in hospital room?: A Little Help needed climbing 3-5 steps with a railing? : A Lot 6 Click Score: 14    End of Session Equipment Utilized During Treatment: Gait belt;Right knee immobilizer Activity Tolerance: Patient tolerated treatment well Patient left: in chair;with call bell/phone within reach Nurse Communication: Mobility status PT Visit Diagnosis: Other abnormalities of gait and mobility (R26.89)     Time: 3295-1884 PT Time Calculation (min) (ACUTE ONLY): 28 min  Charges:  $Gait Training: 8-22 mins $Therapeutic Activity: 8-22 mins                     {Delmont Prosch  PTA Acute  Rehabilitation Services Pager      [phone] Office      [phone]

## 2021-03-20 NOTE — Progress Notes (Signed)
Pharmacy Antibiotic Note  Harry [name] is a 70 y.o. male admitted on 03/11/2021 with  wound infection .  Pharmacy has been consulted for vancomycin dosing.  Pt underwent I&D with placement of wound vac on 9/28. Currently on CTX, antibiotic coverage being broadened with the addition of vancomycin. Plan for possible plastic surgery.  Plan: Continue Vancomycin 1250 mg IV q12h for estimated AUC of 514 Will consider check vancomycin trough in 2-3 days to rule out accumulation if therapy continues Follow renal function, culture data.   Height: 6\' 4"  (193 cm) Weight: 129.3 kg (285 lb) IBW/kg (Calculated) : 86.8  Temp (24hrs), Avg:97.9 F (36.6 C), Min:97.6 F (36.4 C), Max:98.1 F (36.7 C)  Recent Labs  Lab 03/14/21 0444 03/15/21 0428 03/19/21 0425 03/20/21 0442  WBC 6.8 7.5 9.0 6.9  CREATININE 0.87  --  0.74  --      Estimated Creatinine Clearance: 127.9 mL/min (by C-G formula based on SCr of 0.74 mg/dL).    No Known Allergies  Antimicrobials this admission: ceftriaxone 9/26 >>  vancomycin 9/28 >>   Dose adjustments this admission:  Microbiology results: 9/28 Surgical deep wound: ngtd 9/28 Fungal stain: sent  Thank you for allowing pharmacy to be a part of this patient's care.  Peggyann Juba, PharmD, BCPS Pharmacy: [phone] 03/20/2021 9:13 AM

## 2021-03-20 NOTE — Care Management Important Message (Signed)
Medicare IM printed for Social Work at [facility] to give to the patient 

## 2021-03-20 NOTE — Progress Notes (Signed)
Spoke to patient today in his room.  He would like to proceed with integra placement to right knee wound.  Posted for Tuesday 10/4 in the afternoon.  Please keep NPO midnight the night before.  Please hold any blood thinners if possible.  Call with any questions.

## 2021-03-20 NOTE — TOC Initial Note (Addendum)
Transition of Care [facility]) - Initial/Assessment Note    Patient Details  Name: Harry [name] MRN: [id number] Date of Birth: 1951-01-21  Transition of Care [facility]) CM/SW Contact:    Harry Ludwig, LCSW Phone Number: 9/26  Clinical Narrative:                  Patient from home with wife, will need home health set up once patient is medically closer to returning back home.  PT and OT are working with patient and recommending home health services.  Surgery following patient.  Expected Discharge Plan: [place] Barriers to Discharge: Continued Medical Work up   Patient Goals and CMS Choice Patient states their goals for this hospitalization and ongoing recovery are:: To return back home. CMS Medicare.gov Compare Post Acute Care list provided to:: Patient Choice offered to / list presented to : Patient  Expected Discharge Plan and Services Expected Discharge Plan: [place]       Living arrangements for the past 2 months: Single Family Home                                      Prior Living Arrangements/Services Living arrangements for the past 2 months: Single Family Home Lives with:: Spouse Patient language and need for interpreter reviewed:: Yes Do you feel safe going back to the place where you live?: Yes      Need for Family Participation in Patient Care: No (Comment) Care giver support system in place?: No (comment)   Criminal Activity/Legal Involvement Pertinent to Current Situation/Hospitalization: No - Comment as needed  Activities of Daily Living Home Assistive Devices/Equipment: Eyeglasses, Blood pressure cuff, Walker (specify type), Wheelchair, Radio producer (specify quad or straight), Bedside commode/3-in-1 (lift chair) ADL Screening (condition at time of admission) Patient's cognitive ability adequate to safely complete daily activities?: Yes Is the patient deaf or have difficulty hearing?: No Does the patient have  difficulty seeing, even when wearing glasses/contacts?: No Does the patient have difficulty concentrating, remembering, or making decisions?: No Patient able to express need for assistance with ADLs?: Yes Does the patient have difficulty dressing or bathing?: No Independently performs ADLs?: Yes (appropriate for developmental age) Does the patient have difficulty walking or climbing stairs?: No Weakness of Legs: Right Weakness of Arms/Hands: None  Permission Sought/Granted Permission sought to share information with : Family Supports, Case Manager Permission granted to share information with : Yes, Verbal Permission Granted  Share Information with NAME: Harry [name] Spouse [phone]           Emotional Assessment Appearance:: Appears stated age   Affect (typically observed): Calm, Appropriate, Accepting Orientation: : Oriented to Self, Oriented to Place, Oriented to  Time, Oriented to Situation Alcohol / Substance Use: Not Applicable Psych Involvement: No (comment)  Admission diagnosis:  Tachycardia [R00.0] Patient Active Problem List   Diagnosis Date Noted   Pressure injury of skin 03/12/2021   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 [place] [address] Phone: [phone] Fax: [phone]     Social Determinants of Health (SDOH) Interventions    Readmission Risk Interventions No  flowsheet data found.

## 2021-03-20 NOTE — Progress Notes (Signed)
ANTICOAGULATION CONSULT NOTE - Follow Up Consult  Pharmacy Consult for Lovenox Indication: atrial fibrillation  No Known Allergies  Patient Measurements: Height: 6\' 4"  (193 cm) Weight: 129.3 kg (285 lb) IBW/kg (Calculated) : 86.8  Vital Signs: Temp: 98.1 F (36.7 C) (09/30 0800) Temp Source: Oral (09/30 0800) BP: 103/59 (09/30 0800) Pulse Rate: 70 (09/30 0800)  Labs: Recent Labs    03/19/21 0425 03/20/21 0442  HGB 10.2* 9.2*  HCT 33.2* 30.3*  PLT 287 265  CREATININE 0.74  --     Estimated Creatinine Clearance: 127.9 mL/min (by C-G formula based on SCr of 0.74 mg/dL).   Medications:  Scheduled:   (feeding supplement) PROSource Plus  30 mL Oral BID BM   atorvastatin  20 mg Oral Q1500   docusate sodium  100 mg Oral BID   enoxaparin (LOVENOX) injection  120 mg Subcutaneous Q12H   metoprolol tartrate  25 mg Oral BID   pantoprazole  40 mg Oral Daily   senna  1 tablet Oral BID   vitamin B-12  1,000 mcg Oral Daily   Infusions:   cefTRIAXone (ROCEPHIN)  IV 2 g (03/19/21 1043)   vancomycin 1,250 mg (03/20/21 0504)   PRN: acetaminophen, melatonin, metoCLOPramide **OR** metoCLOPramide (REGLAN) injection, ondansetron **OR** ondansetron (ZOFRAN) IV  Assessment: 70 yo male started on full-dose Lovenox for new onset afib with RVR following induction of anesthesia for elective I&D .  Today, 03/20/2021 CBC stable post-op SCr stable, CrCl > 100 ml/min No bleeding documented  Goal of Therapy:  Anti-Xa level 0.6-1 units/ml 4hrs after LMWH dose given Monitor platelets by anticoagulation protocol: Yes   Plan:  Continue Lovenox 120mg  SQ q12h (dose per Dr. Lyla Glassing resumed 9/29 POD1) Monitor CBC, sign/symptoms of bleeding Follow up plans for possible plastic surgery and need to hold Lovenox pre-op  Peggyann Juba, PharmD, BCPS Pharmacy: [phone] 03/20/2021,9:03 AM

## 2021-03-21 DIAGNOSIS — I480 Paroxysmal atrial fibrillation: Secondary | ICD-10-CM | POA: Diagnosis not present

## 2021-03-21 DIAGNOSIS — R000 Tachycardia, unspecified: Secondary | ICD-10-CM | POA: Diagnosis not present

## 2021-03-21 DIAGNOSIS — I96 Gangrene, not elsewhere classified: Secondary | ICD-10-CM | POA: Diagnosis not present

## 2021-03-21 DIAGNOSIS — I9789 Other postprocedural complications and disorders of the circulatory system, not elsewhere classified: Secondary | ICD-10-CM | POA: Diagnosis not present

## 2021-03-21 NOTE — Progress Notes (Signed)
Physical Therapy Treatment Patient Details Name: Harry [name] MRN: [id number] DOB: 1951/02/29 Today's Date: 03/21/2021   History of Present Illness Pt is a 70 year old male and had ortho follow-up on 9/20, his wound was found to be fully demarcated and was therefore indicated for debridement.  He was at the hospital for elective irrigation and debridement of right knee.  Patient was intubated for the procedure.  However upon induction of anesthesia, before the procedure had started, patient developed hypotension down to 60s in A. fib with RVR as well. Pt s/p Excisional debridement of skin and subcutaneous tissue right knee and Placement of negative pressure wound dressing on 03/18/21.   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  Pt with hx of arthritis, HTN, and obesity    PT Comments    Pt tolerates increased ambulation distance, initially hesitant but agreeable. Pt taking slow, step to steps, heavy UE usage on RW, no LOB, cadence significantly decreased. Min guard/supv with transfers to ensure no knee flexion with good results. Pt limited by IV site discomfort, able to maintain RLE extended in KI throughout session. Will continue to progress as able.    Recommendations for follow up therapy are one component of a multi-disciplinary discharge planning process, led by the attending physician.  Recommendations may be updated based on patient status, additional functional criteria and insurance authorization.  Follow Up Recommendations  Home health PT;Supervision for mobility/OOB     Equipment Recommendations  None recommended by PT    Recommendations for Other Services       Precautions / Restrictions Precautions Precautions: Knee;Fall Required Braces or Orthoses: Knee Immobilizer - Right Knee Immobilizer - Right: On at all times Restrictions Other Position/Activity Restrictions: with KI and Walker     Mobility  Bed Mobility Overal bed  mobility: Needs Assistance Bed Mobility: Supine to Sit;Sit to Supine  Supine to sit: Min guard Sit to supine: Min guard   General bed mobility comments: increased time to come to sitting EOB, close min guard to lift LE back into bed to monitoring no knee flexion with good performance    Transfers Overall transfer level: Needs assistance Equipment used: Rolling walker (2 wheeled) Transfers: Sit to/from Stand Sit to Stand: Supervision;From elevated surface  General transfer comment: elevated bed significantly, supv to power to stand with heavy UE assist, RLE extended to avoid knee flexion  Ambulation/Gait Ambulation/Gait assistance: Min guard;Supervision Gait Distance (Feet): 60 Feet Assistive device: Rolling walker (2 wheeled) Gait Pattern/deviations: Step-to pattern;Decreased stride length Gait velocity: decreased   General Gait Details: step to pattern, good steadiness, short and slow steps with heavy UE use on RW, limited by IV site discomfort in wrist   Stairs             Wheelchair Mobility    Modified Rankin (Stroke Patients Only)       Balance Overall balance assessment: Needs assistance Sitting-balance support: Feet supported Sitting balance-Leahy Scale: Good Sitting balance - Comments: seated EOB   Standing balance support: During functional activity;Bilateral upper extremity supported Standing balance-Leahy Scale: Poor Standing balance comment: reliant on UE support, fearful of falling       Cognition Arousal/Alertness: Awake/alert Behavior During Therapy: WFL for tasks assessed/performed Overall Cognitive Status: Within Functional Limits for tasks assessed       Exercises      General Comments        Pertinent Vitals/Pain Pain Assessment: 0-10 Pain Score: 5  Pain Location: R knee with ambualtion Pain Descriptors / Indicators: Aching Pain Intervention(s): Limited activity within patient's tolerance;Monitored during session;Premedicated  before session;Repositioned    Home Living                      Prior Function            PT Goals (current goals can now be found in the care plan section) Acute Rehab PT Goals PT Goal Formulation: With patient Time For Goal Achievement: 04/02/21 Potential to Achieve Goals: Good Progress towards PT goals: Progressing toward goals    Frequency    7X/week      PT Plan Current plan remains appropriate    Co-evaluation              AM-PAC PT "6 Clicks" Mobility   Outcome Measure  Help needed turning from your back to your side while in a flat bed without using bedrails?: A Little Help needed moving from lying on your back to sitting on the side of a flat bed without using bedrails?: A Little Help needed moving to and from a bed to a chair (including a wheelchair)?: A Little Help needed standing up from a chair using your arms (e.g., wheelchair or bedside chair)?: A Little Help needed to walk in hospital room?: A Little Help needed climbing 3-5 steps with a railing? : A Lot 6 Click Score: 17    End of Session Equipment Utilized During Treatment: Gait belt;Right knee immobilizer Activity Tolerance: Patient tolerated treatment well Patient left: in bed;with call bell/phone within reach;with bed alarm set;with nursing/sitter in room;with family/visitor present Nurse Communication: Mobility status PT Visit Diagnosis: Other abnormalities of gait and mobility (R26.89)     Time: 1430-1455 PT Time Calculation (min) (ACUTE ONLY): 25 min  Charges:  $Gait Training: 8-22 mins $Therapeutic Activity: 8-22 mins                      Tori Mona Ayars PT, DPT 03/21/21, 3:21 PM

## 2021-03-21 NOTE — Progress Notes (Signed)
PROGRESS NOTE  Harry [name]  ZOX:[id number] DOB: 30-Nov-1950 DOA: 03/11/2021 PCP: Buzzy Han, MD   Brief Narrative: Harry [name] is a 70 y.o. male with a history of HTN, COPD, previous tobacco use, lymphedema of legs, and severe right knee arthritis s/p TKA 01/07/2021 subsequently discharged to SNF and suffered anterior knee hardware dislocation on 8/1 that was successfully reduced in the ED. Unfortunately this recurred the following day with persistent patella alta on post reduction films at that time. On 8/3 he underwent revision to TKA, reconstruction of extensor mechanism and MCL. At his immediate postop visit, he was found to have some questionable skin flap viability. He was treated for a total of 6 weeks in a cylinder cast and had wound checks at regular intervals. On 9/14, he was discharged from rehab to home. On follow-up on 9/20, his wound was found to be fully demarcated and was therefore indicated for debridement. Next day on 9/21 he was brought the hospital for elective irrigation and debridement of right knee, though suffered hypotension and AFib with RVR upon induction of anesthesia prompting admission, cancellation of surgery. Metoprolol and lovenox started with improved rate control. Ultimately he underwent excisional debridement and placement of wound vac on 9/28 by Dr. Lyla Glassing. He continues on vancomycin and ceftriaxone.  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 TSH 1.043.  - Continue metoprolol 25mg  po BID,  - Continue therapeutic lovenox (reinitiated 9/29 per ortho). Will switch to eliquis at DC for CHA2DS2-VASc score of 3 (age, HTN, atherosclerosis).  - Cardiac monitor has shown NSR for many days with very rare PVCs. Can DC  telemetry to facilitate mobility.  - Recommend outpatient sleep study - Has cardiology follow up arranged 10/14.   Right knee arthroplasty, dislocation, patella alta: s/p repair of extensor mechanism and revision. Wound necrosis complicating healing now s/p I&D, wound vac placement 9/28.  - Continue vancomycin, ceftriaxone pending operative cultures. Harry [name] thus far growing, unclear significance of this. - Plastic surgery, Dr. Claudia Desanctis, consulted by orthopedics. Will attempt integra application in OR, first availability early next week. - Wound vac management per orthopedics.  - WBAT RLE with knee immobilizer in place at all times - Continue tylenol for pain control.  - Protein supplementation ordered to aid in wound healing.   Hypotension: Associated with anesthesia, RVR, since resolved.    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 Prominent eustachian valve was noted.  - Follow up per cardiology.    Chronic bilateral lower extremity lymphedema: Limited venous U/S showed no DVT. No PE on CTA chest.   COPD: Stable. No medications needed at this time. Wish to avoid beta agonists to avoid provocation of atrial dysrhythmia.  HLD:  -Continue statin. LDL is 39.  Nonobstructive CAD: 2v disease w/calcifications by CT. No anginal complaints.  - Continue statin, BB. ASA substituted with anticoagulation.     Anemia Vitamin B12 deficiency. -Patient had normal hemoglobin over 12 till the knee arthroplasty June 2022.  Since then his hemoglobin level has been running less than 9 mostly.  No evidence of active bleeding.  Hemoglobin currently between between 9 and 10.  Ferritin level appropriate at 66, vitamin B12 level at the lower limit of normal at 274.  Oral vitamin B12 has been started.  Insomnia - Melatonin as needed at bedtime.  RN Pressure Injury Documentation: Pressure Injury 03/11/21 Sacrum Mid Stage 3 -  Full  thickness tissue loss. Subcutaneous fat may be visible but bone, tendon or muscle are NOT exposed. Wound is 4.5cmX3.5cm     it is 80% red and about 20% white (Active)  03/11/21 2020  Location: Sacrum  Location Orientation: Mid  Staging: Stage 3 -  Full thickness tissue loss. Subcutaneous fat may be visible but bone, tendon or muscle are NOT exposed.  Wound Description (Comments): Wound is 4.5cmX3.5cm     it is 80% red and about 20% white  Present on Admission: Yes   Obesity: Estimated body mass index is 34.69 kg/m as calculated from the following:   Height as of this encounter: 6\' 4"  (1.93 m).   Weight as of this encounter: 129.3 kg.  DVT prophylaxis: Lovenox 1mg /kg q12h Code Status: Full Family Communication: None at bedside Disposition Plan:  Status is: Inpatient  Remains inpatient appropriate because:Inpatient level of care appropriate due to severity of illness  Dispo: The patient is from: Home              Anticipated d/c is to: Home              Patient currently is not medically stable to d/c.  Consultants:  Orthopedics Cardiology Plastics  Procedures:  03/18/21 IRRIGATION AND DEBRIDEMENT knee placement wound vac Harry [name], Harry Edelman, MD   Antimicrobials: Ceftriaxone 9/26 >> Vancomycin 9/28 >>   Subjective: Pain controlled on tylenol alone. No new complaints.   Objective: Vitals:   03/20/21 1342 03/20/21 2018 03/21/21 0452 03/21/21 0926  BP: 100/68 98/66 98/75  105/78  Pulse: 78 67 60 62  Resp: 20 18 18    Temp: 98.3 F (36.8 C) 97.7 F (36.5 C) (!) 97.5 F (36.4 C)   TempSrc: Oral  Oral   SpO2: 95% 94% 96%   Weight:      Height:       Gen: 70 y.o. male in no distress Pulm: Nonlabored breathing room air. Clear. CV: Regular rate and rhythm. No murmur, rub, or gallop. No JVD, no dependent edema. GI: Abdomen soft, non-tender, non-distended, with normoactive bowel sounds.  Ext: Warm, no deformities. RLE with intact sensation and motor function. KI in  place. Skin: No new rashes, lesions or ulcers on visualized skin. Neuro: Alert and oriented. No focal neurological deficits. Psych: Judgement and insight appear fair. Mood euthymic & affect congruent. Behavior is appropriate.    Data Reviewed: I have personally reviewed following labs and imaging studies  CBC: Recent Labs  Lab 03/15/21 0428 03/19/21 0425 03/20/21 0442  WBC 7.5 9.0 6.9  HGB 9.7* 10.2* 9.2*  HCT 30.9* 33.2* 30.3*  MCV 89.3 89.5 89.9  PLT 269 287 528   Basic Metabolic Panel: Recent Labs  Lab 03/19/21 0425  NA 138  K 4.0  CL 105  CO2 25  GLUCOSE 117*  BUN 13  CREATININE 0.74  CALCIUM 8.5*   COVID PCR 9/21: Negative. Wound culture 9/28: No org on gram stain, rare Harry [name] without susceptibilities at this time.     LOS: 8 days   Time spent: 25 minutes.  Patrecia Pour, MD [facility] [email or web address] 03/21/2021, 12:08 PM

## 2021-03-22 DIAGNOSIS — I480 Paroxysmal atrial fibrillation: Secondary | ICD-10-CM | POA: Diagnosis not present

## 2021-03-22 DIAGNOSIS — R000 Tachycardia, unspecified: Secondary | ICD-10-CM | POA: Diagnosis not present

## 2021-03-22 DIAGNOSIS — I96 Gangrene, not elsewhere classified: Secondary | ICD-10-CM | POA: Diagnosis not present

## 2021-03-22 DIAGNOSIS — I9789 Other postprocedural complications and disorders of the circulatory system, not elsewhere classified: Secondary | ICD-10-CM | POA: Diagnosis not present

## 2021-03-22 LAB — VANCOMYCIN, TROUGH: Vancomycin Tr: 20 ug/mL (ref 15–20)

## 2021-03-22 MED ORDER — METOPROLOL TARTRATE 12.5 MG HALF TABLET
12.5000 mg | ORAL_TABLET | Freq: Two times a day (BID) | ORAL | Status: DC
  Administered 2021-03-22 – 2021-03-31 (×15): 12.5 mg via ORAL
  Filled 2021-03-22 (×19): qty 1

## 2021-03-22 NOTE — Progress Notes (Signed)
**Note Harry [name]** PROGRESS NOTE  [name]  CBJ:[id number] DOB: 08/08/1950 DOA: 03/11/2021 PCP: Buzzy Han, MD   Brief Narrative: Harry [name] is a 70 y.o. male with a history of HTN, COPD, previous tobacco use, lymphedema of legs, and severe right knee arthritis s/p TKA 01/07/2021 subsequently discharged to SNF and suffered anterior knee hardware dislocation on 8/1 that was successfully reduced in the ED. Unfortunately this recurred the following day with persistent patella alta on post reduction films at that time. On 8/3 he underwent revision to TKA, reconstruction of extensor mechanism and MCL. At his immediate postop visit, he was found to have some questionable skin flap viability. He was treated for a total of 6 weeks in a cylinder cast and had wound checks at regular intervals. On 9/14, he was discharged from rehab to home. On follow-up on 9/20, his wound was found to be fully demarcated and was therefore indicated for debridement. Next day on 9/21 he was brought the hospital for elective irrigation and debridement of right knee, though suffered hypotension and AFib with RVR upon induction of anesthesia prompting admission, cancellation of surgery. Metoprolol and lovenox started with improved rate control. Ultimately he underwent excisional debridement and placement of wound vac on 9/28 by Dr. Lyla Glassing. He continues on vancomycin and ceftriaxone.  Assessment & Plan: Principal Problem:   Necrosis of surgical wound ([place]) Active Problems:   Paroxysmal atrial fibrillation (HCC)   Tachycardia   Pressure injury of skin  Paroxysmal atrial flutter with RVR: Echo with preserved LVEF, G1DD, mild aortic root dilatation. TSH 1.043.  - Continue metoprolol 25mg  po BID,  - Continue therapeutic lovenox (reinitiated 9/29 per ortho). Will switch to eliquis at DC for CHA2DS2-VASc score of 3 (age, HTN, atherosclerosis).  - Recommend outpatient sleep study - Has cardiology follow up arranged 10/14.    Right knee arthroplasty, dislocation, patella alta: s/p repair of extensor mechanism and revision. Wound necrosis complicating healing now s/p I&D, wound vac placement 9/28.  - Continue vancomycin, ceftriaxone pending operative cultures. S. epidermidis thus far growing, unclear significance of this. - Plastic surgery, Dr. Claudia Desanctis, consulted by orthopedics. Will attempt integra application in OR 16/0. - Wound vac management per orthopedics.  - WBAT RLE with knee immobilizer in place at all times - Continue tylenol for pain control.  - Protein supplementation ordered to aid in wound healing.   Hypotension: Associated with anesthesia, RVR, since resolved.    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 Prominent eustachian valve was noted.  - Follow up per cardiology.    Chronic bilateral lower extremity lymphedema: Limited venous U/S showed no DVT. No PE on CTA chest.   COPD: Stable. No medications needed at this time. Wish to avoid beta agonists to avoid provocation of atrial dysrhythmia.  HLD:  -Continue statin. LDL is 39.  Nonobstructive CAD: 2v disease w/calcifications by CT. No anginal complaints.  - Continue statin, BB. ASA substituted with anticoagulation.     Anemia Vitamin B12 deficiency. -Patient had normal hemoglobin over 12 till the knee arthroplasty June 2022.  Since then his hemoglobin level has been running less than 9 mostly.  No evidence of active bleeding.  Hemoglobin currently between between 9 and 10.  Ferritin level appropriate at 66, vitamin B12 level at the lower limit of normal at 274.  Oral vitamin B12 has been started.  Insomnia - Melatonin as needed at bedtime.  RN Pressure Injury Documentation: Pressure Injury 03/11/21 Sacrum Mid  Stage 3 -  Full thickness tissue loss. Subcutaneous fat may be visible but bone, tendon or muscle are NOT exposed. Wound is 4.5cmX3.5cm     it is 80% red and about 20%  white (Active)  03/11/21 2020  Location: Sacrum  Location Orientation: Mid  Staging: Stage 3 -  Full thickness tissue loss. Subcutaneous fat may be visible but bone, tendon or muscle are NOT exposed.  Wound Description (Comments): Wound is 4.5cmX3.5cm     it is 80% red and about 20% white  Present on Admission: Yes   Obesity: Estimated body mass index is 34.69 kg/m as calculated from the following:   Height as of this encounter: 6\' 4"  (1.93 m).   Weight as of this encounter: 129.3 kg.  DVT prophylaxis: Lovenox 1mg /kg q12h Code Status: Full Family Communication: None at bedside Disposition Plan:  Status is: Inpatient  Remains inpatient appropriate because:Inpatient level of care appropriate due to severity of illness  Dispo: The patient is from: Home              Anticipated d/c is to: Home              Patient currently is not medically stable to d/c.  Consultants:  Orthopedics Cardiology Plastics  Procedures:  03/18/21 IRRIGATION AND DEBRIDEMENT knee placement wound vac Swinteck, Aaron Edelman, MD   Antimicrobials: Ceftriaxone 9/26 >> Vancomycin 9/28 >>   Subjective: No new complaints today. Pain is controlled, getting up with therapy. No fevers, chills.   Objective: Vitals:   03/21/21 1332 03/21/21 2127 03/22/21 0413 03/22/21 1017  BP: 110/74 116/83 (!) 101/51 111/68  Pulse: 64 65 63 68  Resp: 16 18 18    Temp: 98.4 F (36.9 C) 97.9 F (36.6 C) 98.2 F (36.8 C)   TempSrc: Oral     SpO2: 98% 98% 97%   Weight:      Height:       Gen: 70 y.o. male in no distress Pulm: Nonlabored breathing room air. Clear. CV: Regular rate and rhythm. No murmur, rub, or gallop. No JVD, stable dependent edema. GI: Abdomen soft, non-tender, non-distended, with normoactive bowel sounds.  Ext: Warm, no deformities Skin: No new rashes, lesions or ulcers on visualized skin. Knee immobilizer remains in place. Wound vac with sanguinous output, good seal. R foot NVI. Neuro: Alert and oriented.  No focal neurological deficits. Psych: Judgement and insight appear fair. Mood euthymic & affect congruent. Behavior is appropriate.    Data Reviewed: I have personally reviewed following labs and imaging studies  CBC: Recent Labs  Lab 03/19/21 0425 03/20/21 0442  WBC 9.0 6.9  HGB 10.2* 9.2*  HCT 33.2* 30.3*  MCV 89.5 89.9  PLT 287 314   Basic Metabolic Panel: Recent Labs  Lab 03/19/21 0425  NA 138  K 4.0  CL 105  CO2 25  GLUCOSE 117*  BUN 13  CREATININE 0.74  CALCIUM 8.5*   COVID PCR 9/21: Negative. Wound culture 9/28: No org on gram stain, rare MRSE sensitive to vancomycin, tetracycline.     LOS: 9 days   Time spent: 25 minutes.  Patrecia Pour, MD [facility] [email or web address] 03/22/2021, 4:40 PM

## 2021-03-22 NOTE — Progress Notes (Signed)
Physical Therapy Treatment Patient Details Name: Harry [name] MRN: [id number] DOB: 1950/11/28 Today's Date: 03/22/2021   History of Present Illness Pt is a 70 year old male and had ortho follow-up on 9/20, his wound was found to be fully demarcated and was therefore indicated for debridement.  He was at the hospital for elective irrigation and debridement of right knee.  Patient was intubated for the procedure.  However upon induction of anesthesia, before the procedure had started, patient developed hypotension down to 60s in A. fib with RVR as well. Pt s/p Excisional debridement of skin and subcutaneous tissue right knee and Placement of negative pressure wound dressing on 03/18/21.   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  Pt with hx of arthritis, HTN, and obesity    PT Comments    Pt able to increase ambulation distance, but not fully receptive at times to education provided. He does not like sitting up in the recliner due to it being hard to stand from, but he and his wife liked the idea of sitting edge of bed.  Scheduled for another surgery on 10/4.   Recommendations for follow up therapy are one component of a multi-disciplinary discharge planning process, led by the attending physician.  Recommendations may be updated based on patient status, additional functional criteria and insurance authorization.  Follow Up Recommendations  Home health PT;Supervision for mobility/OOB     Equipment Recommendations  None recommended by PT    Recommendations for Other Services       Precautions / Restrictions Precautions Precautions: Knee;Fall Required Braces or Orthoses: Knee Immobilizer - Right Knee Immobilizer - Right: On at all times Restrictions Weight Bearing Restrictions: Yes RLE Weight Bearing: Weight bearing as tolerated     Mobility  Bed Mobility Overal bed mobility: Needs Assistance Bed Mobility: Supine to Sit;Sit to Supine      Supine to sit: Min guard Sit to supine: Min guard   General bed mobility comments: requires increased time especially with sit > supine    Transfers Overall transfer level: Needs assistance Equipment used: Rolling walker (2 wheeled) Transfers: Sit to/from Stand Sit to Stand: Supervision;From elevated surface         General transfer comment: Pt pulled on RW despite cues to push up from bed.  Ambulation/Gait Ambulation/Gait assistance: Min guard Gait Distance (Feet): 88 Feet Assistive device: Rolling walker (2 wheeled) Gait Pattern/deviations: Step-to pattern;Decreased stride length Gait velocity: decreased   General Gait Details: poor sequencing with cues to step with R LE into the RW first, but stated that hurt more.  L wrist was more bothersome than R knee   Stairs             Wheelchair Mobility    Modified Rankin (Stroke Patients Only)       Balance   Sitting-balance support: Feet supported Sitting balance-Leahy Scale: Good     Standing balance support: During functional activity;Bilateral upper extremity supported Standing balance-Leahy Scale: Poor                              Cognition Arousal/Alertness: Awake/alert Behavior During Therapy: WFL for tasks assessed/performed Overall Cognitive Status: Within Functional Limits for tasks assessed  Exercises      General Comments        Pertinent Vitals/Pain Pain Assessment: 0-10 Pain Score: 4  Pain Location: R knee with ambualtion and L wrist from IV and pressure throught the RW Pain Descriptors / Indicators: Aching;Grimacing Pain Intervention(s): Premedicated before session;Limited activity within patient's tolerance;Monitored during session    Home Living                      Prior Function            PT Goals (current goals can now be found in the care plan section) Acute Rehab PT Goals PT Goal  Formulation: With patient Time For Goal Achievement: 04/02/21 Potential to Achieve Goals: Good Progress towards PT goals: Progressing toward goals    Frequency    7X/week      PT Plan Current plan remains appropriate    Co-evaluation              AM-PAC PT "6 Clicks" Mobility   Outcome Measure  Help needed turning from your back to your side while in a flat bed without using bedrails?: A Little Help needed moving from lying on your back to sitting on the side of a flat bed without using bedrails?: A Little Help needed moving to and from a bed to a chair (including a wheelchair)?: A Little Help needed standing up from a chair using your arms (e.g., wheelchair or bedside chair)?: A Little Help needed to walk in hospital room?: A Little Help needed climbing 3-5 steps with a railing? : A Lot 6 Click Score: 17    End of Session Equipment Utilized During Treatment: Gait belt;Right knee immobilizer Activity Tolerance: Patient tolerated treatment well Patient left: in bed;with call bell/phone within reach;with family/visitor present Nurse Communication: Mobility status PT Visit Diagnosis: Other abnormalities of gait and mobility (R26.89)     Time: 1448-1856 PT Time Calculation (min) (ACUTE ONLY): 26 min  Charges:  $Gait Training: 8-22 mins $Therapeutic Activity: 8-22 mins                     Harry [name], Harry [name]  03/22/2021    Galen Manila 03/22/2021, 2:33 PM

## 2021-03-22 NOTE — Progress Notes (Signed)
Pharmacy Antibiotic Note  Harry [name] is a 70 y.o. male admitted on 03/11/2021 with  wound infection .  Pharmacy has been consulted for vancomycin dosing.  Pt underwent I&D with placement of wound vac on 9/28. Currently on CTX, antibiotic coverage being broadened with the addition of vancomycin. Plan for possible plastic surgery.  Today, 03/22/21 Vancomycin trough= 20  Plan: Continue Vancomycin 1250 mg IV q12h Follow renal function, culture data.  BMET in AM F/U long term plans for antibiotics   Height: 6\' 4"  (193 cm) Weight: 129.3 kg (285 lb) IBW/kg (Calculated) : 86.8  Temp (24hrs), Avg:98.1 F (36.7 C), Min:97.9 F (36.6 C), Max:98.2 F (36.8 C)  Recent Labs  Lab 03/19/21 0425 03/20/21 0442 03/22/21 1703  WBC 9.0 6.9  --   CREATININE 0.74  --   --   VANCOTROUGH  --   --  20     Estimated Creatinine Clearance: 127.9 mL/min (by C-G formula based on SCr of 0.74 mg/dL).    No Known Allergies  Antimicrobials this admission: ceftriaxone 9/26 >>  vancomycin 9/28 >>   Dose adjustments this admission:  Microbiology results: 9/28 Surgical deep wound: MRSE 9/28 Fungal stain: sent  Thank you for allowing pharmacy to be a part of this patient's care.  Napoleon Form  03/22/2021 6:28 PM

## 2021-03-23 ENCOUNTER — Telehealth: Payer: Self-pay

## 2021-03-23 ENCOUNTER — Other Ambulatory Visit (HOSPITAL_COMMUNITY): Payer: Self-pay

## 2021-03-23 DIAGNOSIS — I96 Gangrene, not elsewhere classified: Secondary | ICD-10-CM | POA: Diagnosis not present

## 2021-03-23 DIAGNOSIS — I9789 Other postprocedural complications and disorders of the circulatory system, not elsewhere classified: Secondary | ICD-10-CM | POA: Diagnosis not present

## 2021-03-23 DIAGNOSIS — R000 Tachycardia, unspecified: Secondary | ICD-10-CM | POA: Diagnosis not present

## 2021-03-23 DIAGNOSIS — I480 Paroxysmal atrial fibrillation: Secondary | ICD-10-CM | POA: Diagnosis not present

## 2021-03-23 LAB — CBC
HCT: 29.5 % — ABNORMAL LOW (ref 39.0–52.0)
Hemoglobin: 9 g/dL — ABNORMAL LOW (ref 13.0–17.0)
MCH: 27.7 pg (ref 26.0–34.0)
MCHC: 30.5 g/dL (ref 30.0–36.0)
MCV: 90.8 fL (ref 80.0–100.0)
Platelets: 241 10*3/uL (ref 150–400)
RBC: 3.25 MIL/uL — ABNORMAL LOW (ref 4.22–5.81)
RDW: 16 % — ABNORMAL HIGH (ref 11.5–15.5)
WBC: 6 10*3/uL (ref 4.0–10.5)
nRBC: 0 % (ref 0.0–0.2)

## 2021-03-23 LAB — AEROBIC/ANAEROBIC CULTURE W GRAM STAIN (SURGICAL/DEEP WOUND)

## 2021-03-23 LAB — BASIC METABOLIC PANEL
Anion gap: 4 — ABNORMAL LOW (ref 5–15)
BUN: 13 mg/dL (ref 8–23)
CO2: 23 mmol/L (ref 22–32)
Calcium: 8 mg/dL — ABNORMAL LOW (ref 8.9–10.3)
Chloride: 110 mmol/L (ref 98–111)
Creatinine, Ser: 0.64 mg/dL (ref 0.61–1.24)
GFR, Estimated: >60 mL/min (ref >60)
Glucose, Bld: 94 mg/dL (ref 70–99)
Potassium: 3 mmol/L — ABNORMAL LOW (ref 3.5–5.1)
Sodium: 137 mmol/L (ref 135–145)

## 2021-03-23 MED ORDER — POTASSIUM CHLORIDE CRYS ER 20 MEQ PO TBCR
40.0000 meq | EXTENDED_RELEASE_TABLET | Freq: Once | ORAL | Status: AC
  Administered 2021-03-23: 40 meq via ORAL
  Filled 2021-03-23: qty 2

## 2021-03-23 NOTE — Telephone Encounter (Signed)
[place] from [facility] returned call from [place], [place].  Please go ahead and send over the referral for the patient who is having surgery tomorrow with the wound vac.  Be sure to put on there who is providing the wound vac and when the vac needs to be changed.  Please indicate on the referral if it will be Tuesday, Thursday, Saturday or Monday, Wednesday, Friday.  Once she gets the referral, she will call you back and let you know when they are able to see the patient.  Just give her a call at [phone], if you have any questions.

## 2021-03-23 NOTE — Plan of Care (Signed)

## 2021-03-23 NOTE — Progress Notes (Addendum)
PROGRESS NOTE  Harry [name]  AYT:[id number] DOB: Dec 26, 1950 DOA: 03/11/2021 PCP: Buzzy Han, MD   Brief Narrative: Harry [name] is a 70 y.o. male with a history of HTN, COPD, previous tobacco use, lymphedema of legs, and severe right knee arthritis s/p TKA 01/07/2021 subsequently discharged to SNF and suffered anterior knee hardware dislocation on 8/1 that was successfully reduced in the ED. Unfortunately this recurred the following day with persistent patella alta on post reduction films at that time. On 8/3 he underwent revision to TKA, reconstruction of extensor mechanism and MCL. At his immediate postop visit, he was found to have some questionable skin flap viability. He was treated for a total of 6 weeks in a cylinder cast and had wound checks at regular intervals. On 9/14, he was discharged from rehab to home. On follow-up on 9/20, his wound was found to be fully demarcated and was therefore indicated for debridement. Next day on 9/21 he was brought the hospital for elective irrigation and debridement of right knee, though suffered hypotension and AFib with RVR upon induction of anesthesia prompting admission, cancellation of surgery. Metoprolol and lovenox started with improved rate control. Ultimately he underwent excisional debridement and placement of wound vac on 9/28 by Dr. Lyla Glassing. He continues on vancomycin and ceftriaxone.  Assessment & Plan: Principal Problem:   Necrosis of surgical wound ([facility]) Active Problems:   Paroxysmal atrial fibrillation (HCC)   Tachycardia   Pressure injury of skin  Paroxysmal atrial flutter with RVR: Echo with preserved LVEF, G1DD, mild aortic root dilatation. TSH 1.043.  - Continue metoprolol 25mg  po BID. NSR, stable.  - Continue therapeutic lovenox (reinitiated 9/29 per ortho). Will switch to eliquis at DC for CHA2DS2-VASc score of 3 (age, HTN, atherosclerosis).  - Recommend outpatient sleep study - Has cardiology follow up  arranged 10/14.   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 Wound necrosis complicating healing now s/p I&D, wound vac placement 9/28.  - Continue vancomycin with MRSE growing in cultures. DC'ed ceftriaxone.  - Plastic surgery, Dr. Claudia Desanctis, consulted by orthopedics. Will attempt integra application in OR 35/5. - Wound vac management per orthopedics.  - WBAT RLE with knee immobilizer in place at all times - Continue tylenol for pain control.  - Protein supplementation ordered to aid in wound healing.   Hypokalemia:  - Supplement and monitor with Mg in AM to optimize for surgery.   Hypotension: Associated with anesthesia, RVR, since resolved.    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 Prominent eustachian valve was noted.  - Follow up per cardiology.    Chronic bilateral lower extremity lymphedema: Limited venous U/S showed no DVT. No PE on CTA chest.   COPD: Stable. No medications needed at this time. Wish to avoid beta agonists to avoid provocation of atrial dysrhythmia.  HLD:  -Continue statin. LDL is 39.  Nonobstructive CAD: 2v disease w/calcifications by CT. No anginal complaints.  - Continue statin, BB. ASA substituted with anticoagulation.     Anemia Vitamin B12 deficiency. -Patient had normal hemoglobin over 12 till the knee arthroplasty June 2022.  Since then his hemoglobin level has been running less than 9 mostly.  No evidence of active bleeding.  Hemoglobin currently between between 9 and 10.  Ferritin level appropriate at 66, vitamin B12 level at the lower limit of normal at 274.  Oral vitamin B12 has been started.  Insomnia - Melatonin as needed at bedtime.  RN Pressure Injury Documentation: Pressure Injury 03/11/21 Sacrum Mid Stage 3 -  Full thickness tissue loss. Subcutaneous fat may be visible but bone, tendon or  muscle are NOT exposed. Wound is 4.5cmX3.5cm     it is 80% red and about 20% white (Active)  03/11/21 2020  Location: Sacrum  Location Orientation: Mid  Staging: Stage 3 -  Full thickness tissue loss. Subcutaneous fat may be visible but bone, tendon or muscle are NOT exposed.  Wound Description (Comments): Wound is 4.5cmX3.5cm     it is 80% red and about 20% white  Present on Admission: Yes   Obesity: Estimated body mass index is 34.69 kg/m as calculated from the following:   Height as of this encounter: 6\' 4"  (1.93 m).   Weight as of this encounter: 129.3 kg.  DVT prophylaxis: Lovenox 1mg /kg q12h Code Status: Full Family Communication: None at bedside Disposition Plan:  Status is: Inpatient  Remains inpatient appropriate because:Inpatient level of care appropriate due to severity of illness  Dispo: The patient is from: Home              Anticipated d/c is to: Home              Patient currently is not medically stable to d/c.  Consultants:  Orthopedics Cardiology Plastics  Procedures:  03/18/21 IRRIGATION AND DEBRIDEMENT knee placement wound vac Rod Can, MD   Antimicrobials: Ceftriaxone 9/26 >> Vancomycin 9/28 >>   Subjective: Pain controlled, eager for surgery.   Objective: Vitals:   03/22/21 2058 03/23/21 0539 03/23/21 0919 03/23/21 1315  BP: 103/67 111/75 113/78 109/68  Pulse: 71 64 65 72  Resp: 20 18 18 16   Temp: 98 F (36.7 C) 98.1 F (36.7 C) 98.1 F (36.7 C) 98.3 F (36.8 C)  TempSrc: Oral Oral Oral Oral  SpO2: 95% 98% 98% 99%  Weight:      Height:       Gen: 70 y.o. male in no distress Pulm: Nonlabored breathing room air. Clear. CV: Regular rate and rhythm. No murmur, rub, or gallop. No JVD, no dependent edema. GI: Abdomen soft, non-tender, non-distended, with normoactive bowel sounds.  Ext: Warm, no deformities. Right foot neurovascularly intact.  Skin: No new rashes, lesions or ulcers on visualized skin. Neuro: Alert and oriented. No  focal neurological deficits. Psych: Judgement and insight appear fair. Mood euthymic & affect congruent. Behavior is appropriate.    Data Reviewed: I have personally reviewed following labs and imaging studies  CBC: Recent Labs  Lab 03/19/21 0425 03/20/21 0442 03/23/21 0430  WBC 9.0 6.9 6.0  HGB 10.2* 9.2* 9.0*  HCT 33.2* 30.3* 29.5*  MCV 89.5 89.9 90.8  PLT 287 265 106   Basic Metabolic Panel: Recent Labs  Lab 03/19/21 0425 03/23/21 0430  NA 138 137  K 4.0 3.0*  CL 105 110  CO2 25 23  GLUCOSE 117* 94  BUN 13 13  CREATININE 0.74 0.64  CALCIUM 8.5* 8.0*   COVID PCR 9/21: Negative. Wound culture 9/28: No org on gram stain, rare MRSE sensitive to vancomycin, tetracycline.     LOS: 10 days   Time spent: 25 minutes.  Patrecia Pour, MD [facility] [email or web address] 03/23/2021, 5:03 PM

## 2021-03-23 NOTE — Progress Notes (Signed)
ANTICOAGULATION CONSULT NOTE - Follow Up Consult  Pharmacy Consult for Lovenox Indication: atrial fibrillation  No Known Allergies  Patient Measurements: Height: 6\' 4"  (193 cm) Weight: 129.3 kg (285 lb) IBW/kg (Calculated) : 86.8  Vital Signs: Temp: 98.1 F (36.7 C) (10/03 0539) Temp Source: Oral (10/03 0539) BP: 111/75 (10/03 0539) Pulse Rate: 64 (10/03 0539)  Labs: Recent Labs    03/23/21 0430  HGB 9.0*  HCT 29.5*  PLT 241  CREATININE 0.64     Estimated Creatinine Clearance: 127.9 mL/min (by C-G formula based on SCr of 0.64 mg/dL).   Medications:  Scheduled:   (feeding supplement) PROSource Plus  30 mL Oral BID BM   atorvastatin  20 mg Oral Q1500   docusate sodium  100 mg Oral BID   enoxaparin (LOVENOX) injection  120 mg Subcutaneous Q12H   metoprolol tartrate  12.5 mg Oral BID   pantoprazole  40 mg Oral Daily   senna  1 tablet Oral BID   vitamin B-12  1,000 mcg Oral Daily    Assessment: 70 yo male started on full-dose Lovenox for new onset afib with RVR following induction of anesthesia for elective I&D .  Today, 03/23/2021 CBC stable, Hgb remains low/stable at 9.0, Plt WNL. SCr stable, CrCl > 100 ml/min No bleeding documented Dose not given on 10/2 PM (pt/family refused) Plastic surgery to attempt integra application in OR 69/7.  Goal of Therapy:  Anti-Xa level 0.6-1 units/ml 4hrs after LMWH dose given Monitor platelets by anticoagulation protocol: Yes   Plan:  Continue Lovenox 120mg  SQ q12h (dose per Dr. Lyla Glassing resumed 9/29 POD1) Monitor CBC, sign/symptoms of bleeding Follow up plans for anticoagulation around surgery and plans for DOAC prior to discharge.    Gretta Arab PharmD, BCPS Clinical Pharmacist [facility] main pharmacy [phone] 03/23/2021 8:30 AM

## 2021-03-23 NOTE — Progress Notes (Signed)
Physical Therapy Treatment Patient Details Name: Harry [name] MRN: [id number] DOB: 03-21-1951 Today's Date: 03/23/2021   History of Present Illness Pt is a 70 year old male and had ortho follow-up on 9/20, his wound was found to be fully demarcated and was therefore indicated for debridement.  He was at the hospital for elective irrigation and debridement of right knee.  Patient was intubated for the procedure.  However upon induction of anesthesia, before the procedure had started, patient developed hypotension down to 60s in A. fib with RVR as well. Pt s/p Excisional debridement of skin and subcutaneous tissue right knee and Placement of negative pressure wound dressing on 03/18/21.   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  Pt with hx of arthritis, HTN, and obesity    PT Comments    Today's skilled session continued to focus on mobility with no issues noted or reported- no increase in knee pain with gait and less wrist pain reported. Acute PT to continue during pt's hospital stay.   Recommendations for follow up therapy are one component of a multi-disciplinary discharge planning process, led by the attending physician.  Recommendations may be updated based on patient status, additional functional criteria and insurance authorization.  Follow Up Recommendations  Home health PT;Supervision for mobility/OOB     Equipment Recommendations  None recommended by PT    Precautions / Restrictions Precautions Precautions: Knee;Fall;Other (comment) Precaution Comments: VAC to right LE, post op shoe right LE Required Braces or Orthoses: Knee Immobilizer - Right Knee Immobilizer - Right: On at all times Restrictions RLE Weight Bearing: Weight bearing as tolerated Other Position/Activity Restrictions: with KI and Walker     Mobility  Bed Mobility Overal bed mobility: Needs Assistance Bed Mobility: Supine to Sit;Sit to Supine     Supine to sit:  Supervision Sit to supine: Supervision   General bed mobility comments: increased time, however no physical assistance needed    Transfers Overall transfer level: Needs assistance Equipment used: Rolling walker (2 wheeled) Transfers: Sit to/from Stand Sit to Stand: Min guard;From elevated surface         General transfer comment: pt with both hands on walker despite cues, PTA blocking wheel so walker does not roll away  Ambulation/Gait Ambulation/Gait assistance: Min guard Gait Distance (Feet): 70 Feet Assistive device: Rolling walker (2 wheeled) Gait Pattern/deviations: Step-through pattern;Decreased stride length;Antalgic Gait velocity: decreased   General Gait Details: pt taking reciprocal steps today with decreased stride noted, heavy reliance on UE's on walker with less pain reported in left wrist compared to yesterday's session. No balance issues noted.         Cognition Arousal/Alertness: Awake/alert Behavior During Therapy: WFL for tasks assessed/performed Overall Cognitive Status: Within Functional Limits for tasks assessed               Pertinent Vitals/Pain Pain Assessment: 0-10 Pain Score: 5  Pain Location: R knee with ambualtion and less in left wrist today Pain Descriptors / Indicators: Aching;Grimacing Pain Intervention(s): Limited activity within patient's tolerance;Monitored during session;Repositioned;Premedicated before session     PT Goals (current goals can now be found in the care plan section) Acute Rehab PT Goals PT Goal Formulation: With patient Time For Goal Achievement: 04/02/21 Potential to Achieve Goals: Good Progress towards PT goals: Progressing toward goals    Frequency    Min 5X/week      PT Plan Current plan remains appropriate    AM-PAC PT "6  Clicks" Mobility   Outcome Measure  Help needed turning from your back to your side while in a flat bed without using bedrails?: A Little Help needed moving from lying on  your back to sitting on the side of a flat bed without using bedrails?: A Little Help needed moving to and from a bed to a chair (including a wheelchair)?: A Little Help needed standing up from a chair using your arms (e.g., wheelchair or bedside chair)?: A Little Help needed to walk in hospital room?: A Little Help needed climbing 3-5 steps with a railing? : A Lot 6 Click Score: 17    End of Session Equipment Utilized During Treatment: Gait belt;Right knee immobilizer Activity Tolerance: Patient tolerated treatment well Patient left: in bed;with call bell/phone within reach;with bed alarm set Nurse Communication: Mobility status PT Visit Diagnosis: Other abnormalities of gait and mobility (R26.89)     Time: 6438-3818 PT Time Calculation (min) (ACUTE ONLY): 19 min  Charges:  $Gait Training: 8-22 mins                    Willow Ora, PTA, [place]- [phone] 03/23/21, 4:55 PM    Willow Ora 03/23/2021, 4:53 PM

## 2021-03-23 NOTE — Telephone Encounter (Signed)
Per Olivia Mackie @ [facility], she will take care of order for wound vac as she has not received it from the case worker at [facility] yet. She adv that since pt is currently admitted into the hospital, the caseworker that is there order the wound vac x pt. Outpatient SX require pt to have wound vac before SX so that it can placed on during that time.   Loleta Chance that she would reach back out to our office if she needed anything else.   Will call Jana Half with [facility] back tomorrow to confirm placement of pt.

## 2021-03-23 NOTE — Telephone Encounter (Signed)
I called Jana Half with [facility] to get pt established to have a nurse come out to change his wound vac dressings as requested by Dr. Claudia Desanctis. I left vm;I haven't received a response yet.   I called Dawn with [facility] to place wound vac order as adv by Levada Dy, CMA. Was informed that wound vac must be sent to pt and they have to take it with them to [facility] so that it can be placed on because the hospital's vac is temporary and does not go home with the pt. I left vm and sent text message as instructed on Dawn's vm. I haven't received a response yet.   I called [facility] desk to inquire if pt already had wound vac. Benjamine Mola stated pt was scheduled to have wound vac placement on 9/21 but the Px was cancelled. She stated she wasn't aware if pt had wound vac with him or not but a wound vac is placed during [facility]. Pt was rescheduled for Px on 9/28 and performed. The following dressings were applied per 9/28 surgical note:  Dressings    Knee Right DRSG VACUUM ATS MED (), PADDING CAST COTTON 6X4  STRL (), BNDG ELASTIC 6X10 VLCR STRL LF (), IMMOBILIZER KNEE 20 ()   Pt is scheduled for SX tomorrow, 10/4 and HH has not been established yet. Will await [place] from [facility] to return call tomorrow. Will pursue other agencies tomorrow if response not receive.

## 2021-03-23 NOTE — Telephone Encounter (Signed)
Per Dawn per text message, due to pt currently being in the hospital, a hostile vac will be placed and the case manager can order a vac. She stated she would reach out to her coworker to see if she knew anything regarding this pt.

## 2021-03-24 ENCOUNTER — Inpatient Hospital Stay (HOSPITAL_COMMUNITY): Payer: Medicare Other | Admitting: Anesthesiology

## 2021-03-24 ENCOUNTER — Encounter (HOSPITAL_COMMUNITY)
Admission: RE | Disposition: A | Discharge status: Home Health | Payer: Self-pay | Source: Home / Self Care | Attending: Internal Medicine

## 2021-03-24 ENCOUNTER — Encounter (HOSPITAL_COMMUNITY): Payer: Self-pay | Admitting: Internal Medicine

## 2021-03-24 DIAGNOSIS — I480 Paroxysmal atrial fibrillation: Secondary | ICD-10-CM | POA: Diagnosis not present

## 2021-03-24 DIAGNOSIS — I96 Gangrene, not elsewhere classified: Secondary | ICD-10-CM | POA: Diagnosis not present

## 2021-03-24 DIAGNOSIS — I9789 Other postprocedural complications and disorders of the circulatory system, not elsewhere classified: Secondary | ICD-10-CM

## 2021-03-24 DIAGNOSIS — R000 Tachycardia, unspecified: Secondary | ICD-10-CM | POA: Diagnosis not present

## 2021-03-24 HISTORY — PX: DEBRIDEMENT AND CLOSURE WOUND: SHX5614

## 2021-03-24 HISTORY — PX: APPLICATION OF WOUND VAC: SHX5189

## 2021-03-24 LAB — BASIC METABOLIC PANEL
Anion gap: 5 (ref 5–15)
BUN: 13 mg/dL (ref 8–23)
CO2: 22 mmol/L (ref 22–32)
Calcium: 8.7 mg/dL — ABNORMAL LOW (ref 8.9–10.3)
Chloride: 115 mmol/L — ABNORMAL HIGH (ref 98–111)
Creatinine, Ser: 0.81 mg/dL (ref 0.61–1.24)
GFR, Estimated: >60 mL/min (ref >60)
Glucose, Bld: 96 mg/dL (ref 70–99)
Potassium: 3.5 mmol/L (ref 3.5–5.1)
Sodium: 142 mmol/L (ref 135–145)

## 2021-03-24 LAB — MAGNESIUM: Magnesium: 2.1 mg/dL (ref 1.7–2.4)

## 2021-03-24 LAB — CBC
HCT: 31.2 % — ABNORMAL LOW (ref 39.0–52.0)
Hemoglobin: 9.6 g/dL — ABNORMAL LOW (ref 13.0–17.0)
MCH: 27.7 pg (ref 26.0–34.0)
MCHC: 30.8 g/dL (ref 30.0–36.0)
MCV: 90.2 fL (ref 80.0–100.0)
Platelets: 248 10*3/uL (ref 150–400)
RBC: 3.46 MIL/uL — ABNORMAL LOW (ref 4.22–5.81)
RDW: 16 % — ABNORMAL HIGH (ref 11.5–15.5)
WBC: 6.7 10*3/uL (ref 4.0–10.5)
nRBC: 0 % (ref 0.0–0.2)

## 2021-03-24 SURGERY — DEBRIDEMENT, WOUND, WITH CLOSURE
Anesthesia: General | Site: Knee | Laterality: Right

## 2021-03-24 MED ORDER — PROPOFOL 10 MG/ML IV BOLUS
INTRAVENOUS | Status: DC | PRN
  Administered 2021-03-24: 150 mg via INTRAVENOUS
  Administered 2021-03-24: 50 mg via INTRAVENOUS

## 2021-03-24 MED ORDER — OXYCODONE HCL 5 MG PO TABS: 5.0000 mg | ORAL_TABLET | Freq: Once | ORAL | Status: DC | PRN

## 2021-03-24 MED ORDER — EPHEDRINE SULFATE-NACL 50-0.9 MG/10ML-% IV SOSY
PREFILLED_SYRINGE | INTRAVENOUS | Status: DC | PRN
  Administered 2021-03-24: 10 mg via INTRAVENOUS

## 2021-03-24 MED ORDER — ACETAMINOPHEN 10 MG/ML IV SOLN
INTRAVENOUS | Status: AC
  Filled 2021-03-24: qty 100

## 2021-03-24 MED ORDER — DEXAMETHASONE SODIUM PHOSPHATE 10 MG/ML IJ SOLN
INTRAMUSCULAR | Status: AC
  Filled 2021-03-24: qty 1

## 2021-03-24 MED ORDER — FENTANYL CITRATE (PF) 100 MCG/2ML IJ SOLN
INTRAMUSCULAR | Status: DC | PRN
  Administered 2021-03-24 (×4): 50 ug via INTRAVENOUS

## 2021-03-24 MED ORDER — LIDOCAINE HCL (PF) 2 % IJ SOLN
INTRAMUSCULAR | Status: AC
  Filled 2021-03-24: qty 5

## 2021-03-24 MED ORDER — AMISULPRIDE (ANTIEMETIC) 5 MG/2ML IV SOLN: 10.0000 mg | Freq: Once | INTRAVENOUS | Status: DC | PRN

## 2021-03-24 MED ORDER — 0.9 % SODIUM CHLORIDE (POUR BTL) OPTIME
TOPICAL | Status: DC | PRN
  Administered 2021-03-24: 3000 mL

## 2021-03-24 MED ORDER — FENTANYL CITRATE (PF) 100 MCG/2ML IJ SOLN
INTRAMUSCULAR | Status: AC
  Filled 2021-03-24: qty 2

## 2021-03-24 MED ORDER — ONDANSETRON HCL 4 MG/2ML IJ SOLN
INTRAMUSCULAR | Status: AC
  Filled 2021-03-24: qty 2

## 2021-03-24 MED ORDER — DEXAMETHASONE SODIUM PHOSPHATE 4 MG/ML IJ SOLN
INTRAMUSCULAR | Status: DC | PRN
  Administered 2021-03-24: 6 mg via INTRAVENOUS

## 2021-03-24 MED ORDER — LIDOCAINE 2% (20 MG/ML) 5 ML SYRINGE
INTRAMUSCULAR | Status: DC | PRN
  Administered 2021-03-24: 100 mg via INTRAVENOUS

## 2021-03-24 MED ORDER — OXYCODONE HCL 5 MG/5ML PO SOLN
5.0000 mg | Freq: Once | ORAL | Status: DC | PRN
Start: 2021-03-24 — End: 2021-03-24

## 2021-03-24 MED ORDER — ACETAMINOPHEN 160 MG/5ML PO SOLN: 325.0000 mg | ORAL | Status: DC | PRN

## 2021-03-24 MED ORDER — CHLORHEXIDINE GLUCONATE 0.12 % MT SOLN
15.0000 mL | Freq: Once | OROMUCOSAL | Status: AC
  Administered 2021-03-24: 15 mL via OROMUCOSAL

## 2021-03-24 MED ORDER — PROMETHAZINE HCL 25 MG/ML IJ SOLN: 6.2500 mg | INTRAMUSCULAR | Status: DC | PRN

## 2021-03-24 MED ORDER — ONDANSETRON HCL 4 MG/2ML IJ SOLN
INTRAMUSCULAR | Status: DC | PRN
  Administered 2021-03-24: 4 mg via INTRAVENOUS

## 2021-03-24 MED ORDER — CEFAZOLIN IN SODIUM CHLORIDE 3-0.9 GM/100ML-% IV SOLN
3.0000 g | INTRAVENOUS | Status: AC
  Administered 2021-03-24: 3 g via INTRAVENOUS
  Filled 2021-03-24: qty 100

## 2021-03-24 MED ORDER — FENTANYL CITRATE PF 50 MCG/ML IJ SOSY: 25.0000 ug | PREFILLED_SYRINGE | INTRAMUSCULAR | Status: DC | PRN

## 2021-03-24 MED ORDER — ACETAMINOPHEN 10 MG/ML IV SOLN
1000.0000 mg | Freq: Once | INTRAVENOUS | Status: DC | PRN
  Administered 2021-03-24: 1000 mg via INTRAVENOUS

## 2021-03-24 MED ORDER — PROPOFOL 10 MG/ML IV BOLUS
INTRAVENOUS | Status: AC
  Filled 2021-03-24: qty 20

## 2021-03-24 MED ORDER — ACETAMINOPHEN 325 MG PO TABS: 325.0000 mg | ORAL_TABLET | ORAL | Status: DC | PRN

## 2021-03-24 MED ORDER — LACTATED RINGERS IV SOLN: INTRAVENOUS | Status: DC

## 2021-03-24 MED ORDER — EPHEDRINE 5 MG/ML INJ
INTRAVENOUS | Status: AC
  Filled 2021-03-24: qty 5

## 2021-03-24 SURGICAL SUPPLY — 37 items
BAG COUNTER SPONGE SURGICOUNT (BAG) IMPLANT
BLADE HEX COATED 2.75 (ELECTRODE) ×1 IMPLANT
BNDG ELASTIC 4X5.8 VLCR STR LF (GAUZE/BANDAGES/DRESSINGS) ×1 IMPLANT
BNDG ELASTIC 6X5.8 VLCR STR LF (GAUZE/BANDAGES/DRESSINGS) ×1 IMPLANT
BNDG GAUZE ELAST 4 BULKY (GAUZE/BANDAGES/DRESSINGS) ×2 IMPLANT
CANISTER WOUNDNEG PRESSURE 500 (CANNISTER) ×1 IMPLANT
DECANTER SPIKE VIAL GLASS SM (MISCELLANEOUS) IMPLANT
DRAPE EXTREMITY T 121X128X90 (DISPOSABLE) ×1 IMPLANT
DRAPE LAPAROSCOPIC ABDOMINAL (DRAPES) IMPLANT
DRAPE LAPAROTOMY T 102X78X121 (DRAPES) IMPLANT
DRAPE UTILITY XL STRL (DRAPES) ×2 IMPLANT
DRSG EMULSION OIL 3X16 NADH (GAUZE/BANDAGES/DRESSINGS) ×2 IMPLANT
DRSG PAD ABDOMINAL 8X10 ST (GAUZE/BANDAGES/DRESSINGS) ×3 IMPLANT
DRSG VAC ATS MED SENSATRAC (GAUZE/BANDAGES/DRESSINGS) ×1 IMPLANT
DRSG VERSA FOAM LRG 10X15 (GAUZE/BANDAGES/DRESSINGS) ×1 IMPLANT
ELECT REM PT RETURN 15FT ADLT (MISCELLANEOUS) ×2 IMPLANT
GAUZE SPONGE 4X4 12PLY STRL (GAUZE/BANDAGES/DRESSINGS) ×1 IMPLANT
GLOVE SURG ENC TEXT LTX SZ7.5 (GLOVE) ×2 IMPLANT
GOWN STRL REUS W/TWL LRG LVL3 (GOWN DISPOSABLE) ×5 IMPLANT
JET LAVAGE IRRISEPT WOUND (IRRIGATION / IRRIGATOR) ×2
KIT BASIN OR (CUSTOM PROCEDURE TRAY) ×2 IMPLANT
KIT TURNOVER KIT A (KITS) ×2 IMPLANT
LAVAGE JET IRRISEPT WOUND (IRRIGATION / IRRIGATOR) IMPLANT
NEEDLE HYPO 22GX1.5 SAFETY (NEEDLE) IMPLANT
NS IRRIG 1000ML POUR BTL (IV SOLUTION) ×1 IMPLANT
PACK GENERAL/GYN (CUSTOM PROCEDURE TRAY) ×2 IMPLANT
STAPLER VISISTAT 35W (STAPLE) ×2 IMPLANT
SURGILUBE 2OZ TUBE FLIPTOP (MISCELLANEOUS) ×1 IMPLANT
SUT ETHILON 3 0 PS 1 (SUTURE) ×1 IMPLANT
SUT MON AB 3-0 SH 27 (SUTURE)
SUT MON AB 3-0 SH27 (SUTURE) IMPLANT
SUT VIC AB 2-0 SH 27 (SUTURE) ×2
SUT VIC AB 2-0 SH 27X BRD (SUTURE) IMPLANT
SUT VIC AB 4-0 PS2 27 (SUTURE) IMPLANT
SYR CONTROL 10ML LL (SYRINGE) IMPLANT
TAPE CLOTH SURG 6X10 WHT LF (GAUZE/BANDAGES/DRESSINGS) ×1 IMPLANT
TOWEL OR 17X26 10 PK STRL BLUE (TOWEL DISPOSABLE) ×2 IMPLANT

## 2021-03-24 NOTE — Op Note (Signed)
Operative Note   DATE OF OPERATION: 03/24/2021  SURGICAL DEPARTMENT: Plastic Surgery  PREOPERATIVE DIAGNOSES: Right knee wound  POSTOPERATIVE DIAGNOSES:  same  PROCEDURE: 1.  Debridement right knee wound totaling 8 x 8 cm including skin and subcutaneous tissue 2.  Washout of right knee wound 3.  Placement of right knee wound wound VAC totaling 8 x 8 cm  SURGEON: Talmadge Coventry, MD  ASSISTANT: Verdie Shire, PA The advanced practice practitioner (APP) assisted throughout the case.  The APP was essential in retraction and counter traction when needed to make the case progress smoothly.  This retraction and assistance made it possible to see the tissue planes for the procedure.  The assistance was needed for hemostasis, tissue re-approximation and closure of the incision site.   ANESTHESIA:  General.   COMPLICATIONS: None.   INDICATIONS FOR PROCEDURE:  The patient, Harry [name] is a 70 y.o. male born on [birth year], is here for treatment of right knee wound. MRN: [id number]  CONSENT:  Informed consent was obtained directly from the patient. Risks, benefits and alternatives were fully discussed. Specific risks including but not limited to bleeding, infection, hematoma, seroma, scarring, pain, contracture, asymmetry, wound healing problems, and need for further surgery were all discussed. The patient did have an ample opportunity to have questions answered to satisfaction.   DESCRIPTION OF PROCEDURE:  The patient was taken to the operating room. SCDs were placed and antibiotics were given.  General anesthesia was administered.  Started by removing the wound VAC.  There was immediate egress of serosanguineous fluid from the wound.  This was cultured with a culture swab.  The patient's operative site was prepped and draped in a sterile fashion. A time out was performed and all information was confirmed to be correct.  Started by removing sutures that were extending inferior to the  patella.  This exposed what appeared to be the reconstructed patellar tendon and extensor mechanism.  This tissue did not appear to be healthy.  With gentle pressure with my index finger a portion of this patellar tendon reconstruction separated giving direct communication to the knee joint.  Further fluid was then seen draining from that area.  A deep wound culture was then done deep to the patellar tendon reconstruction.  This point I decided not to place any wound matrix as I did not feel the deeper tissues were healthy enough to accept or incorporate the wound matrix.  Additionally there is a concern for infection given previously positive cultures and additional fluid identified today.  I then proceeded with copious irrigation.  This was done with Irrisept followed by 3 L of saline pulse lavage.  Any obviously necrotic skin or subcutaneous tissue was debrided with pickups and scissors.  Hemostasis was ensured.  The tissue was then reapproximated with a combination of 2-0 Vicryl sutures and 3-0 nylon mattress sutures.  Wound VAC was applied with white sponge over the patella and black sponge over that with an incisional VAC component inferiorly.  The patient tolerated the procedure well.  There were no complications. The patient was allowed to wake from anesthesia, extubated and taken to the recovery room in satisfactory condition.

## 2021-03-24 NOTE — Anesthesia Procedure Notes (Signed)
Procedure Name: LMA Insertion Date/Time: 03/24/2021 2:14 PM Performed by: Lieutenant Diego, CRNA Pre-anesthesia Checklist: Patient identified, Emergency Drugs available, Suction available and Patient being monitored Patient Re-evaluated:Patient Re-evaluated prior to induction Oxygen Delivery Method: Circle system utilized Preoxygenation: Pre-oxygenation with 100% oxygen Induction Type: IV induction Ventilation: Mask ventilation without difficulty LMA: LMA inserted LMA Size: 4.0 Number of attempts: 1 Placement Confirmation: positive ETCO2 and breath sounds checked- equal and bilateral Tube secured with: Tape Dental Injury: Teeth and Oropharynx as per pre-operative assessment

## 2021-03-24 NOTE — Progress Notes (Signed)
Patient went for right knee wound debridement and wound VAC change today.  Deeper tissues and the extensor mechanism did not appear healthy enough to accept wound matrix.  There was quite a bit of fluid superficial and deep to the patellar tendon reconstruction which was cultured.  Wound VAC was then applied again.  Patient will need transfer to tertiary care facility for evaluation of his knee joint and for assistance with soft tissue coverage.

## 2021-03-24 NOTE — Transfer of Care (Signed)
Immediate Anesthesia Transfer of Care Note  Patient: Harry [name]  Procedure(s) Performed: Right knee wound debridement (Right: Knee) APPLICATION OF WOUND VAC (Right: Knee)  Patient Location: PACU  Anesthesia Type:General  Level of Consciousness: awake, alert  and oriented  Airway & Oxygen Therapy: Patient Spontanous Breathing and Patient connected to face mask oxygen  Post-op Assessment: Report given to RN, Post -op Vital signs reviewed and stable and Patient moving all extremities X 4  Post vital signs: Reviewed and stable  Last Vitals:  Vitals Value Taken Time  BP 122/81   Temp    Pulse 69 03/24/21 1517  Resp 11 03/24/21 1517  SpO2 100 % 03/24/21 1517  Vitals shown include unvalidated device data.  Last Pain:  Vitals:   03/24/21 1246  TempSrc:   PainSc: 0-No pain      Patients Stated Pain Goal: 3 (03/28/11 1975)  Complications: No notable events documented.

## 2021-03-24 NOTE — Interval H&P Note (Signed)
History and Physical Interval Note:  03/24/2021 1:59 PM  Harry [name]  has presented today for surgery, with the diagnosis of Right knee wound.  The various methods of treatment have been discussed with the patient and family. After consideration of risks, benefits and other options for treatment, the patient has consented to  Procedure(s) with comments: Right knee wound debridement (Right) - 1 hr APPLICATION OF INTEGRA (Right) APPLICATION OF WOUND VAC (Right) as a surgical intervention.  The patient's history has been reviewed, patient examined, no change in status, stable for surgery.  I have reviewed the patient's chart and labs.  Questions were answered to the patient's satisfaction.     Cindra Presume

## 2021-03-24 NOTE — Anesthesia Postprocedure Evaluation (Signed)
Anesthesia Post Note  Patient: Harry [name]  Procedure(s) Performed: Right knee wound debridement (Right: Knee) APPLICATION OF WOUND VAC (Right: Knee)     Patient location during evaluation: PACU Anesthesia Type: General Level of consciousness: awake and alert Pain management: pain level controlled Vital Signs Assessment: post-procedure vital signs reviewed and stable Respiratory status: spontaneous breathing, nonlabored ventilation, respiratory function stable and patient connected to nasal cannula oxygen Cardiovascular status: blood pressure returned to baseline and stable Postop Assessment: no apparent nausea or vomiting Anesthetic complications: no   No notable events documented.  Last Vitals:  Vitals:   03/24/21 1545 03/24/21 1559  BP: (!) 142/79 (!) 150/97  Pulse: 63 67  Resp: 12 14  Temp:  36.5 C  SpO2: 100% 100%    Last Pain:  Vitals:   03/24/21 1559  TempSrc: Oral  PainSc: 0-No pain                 Effie Berkshire

## 2021-03-24 NOTE — Brief Op Note (Signed)
03/11/2021 - 03/24/2021  3:08 PM  PATIENT:  Harry [name]  69 y.o. male  PRE-OPERATIVE DIAGNOSIS:  Right knee wound  POST-OPERATIVE DIAGNOSIS:  Right knee wound  PROCEDURE:  Procedure(s) with comments: Right knee wound debridement (Right) - 1 hr APPLICATION OF WOUND VAC (Right)  SURGEON:  Surgeon(s) and Role:    * Ashla Murph, Steffanie Dunn, MD - Primary  PHYSICIAN ASSISTANT: Software engineer, PA  ASSISTANTS: none   ANESTHESIA:   general  EBL:  20   BLOOD ADMINISTERED:none  DRAINS: none   LOCAL MEDICATIONS USED:  NONE  SPECIMEN:  Source of Specimen:  superfical and deep wound cultures  DISPOSITION OF SPECIMEN:  PATHOLOGY  COUNTS:  YES  TOURNIQUET:  * Missing tourniquet times found for documented tourniquets in log: [id number] *  DICTATION: .Dragon Dictation  PLAN OF CARE: Admit to inpatient   PATIENT DISPOSITION:  PACU - hemodynamically stable.   Delay start of Pharmacological VTE agent (>24hrs) due to surgical blood loss or risk of bleeding: not applicable

## 2021-03-24 NOTE — Anesthesia Preprocedure Evaluation (Addendum)
Anesthesia Evaluation  Patient identified by MRN, date of birth, ID band Patient awake    Reviewed: Allergy & Precautions, NPO status , Patient's Chart, lab work & pertinent test results  History of Anesthesia Complications (+) PONV and history of anesthetic complications  Airway Mallampati: I  TM Distance: >3 FB Neck ROM: Full    Dental  (+) Edentulous Upper, Missing, Poor Dentition,    Pulmonary former smoker,     + decreased breath sounds      Cardiovascular hypertension,  Rhythm:Regular Rate:Normal     Neuro/Psych negative neurological ROS  negative psych ROS   GI/Hepatic Neg liver ROS, GERD  ,  Endo/Other  negative endocrine ROS  Renal/GU negative Renal ROS     Musculoskeletal  (+) Arthritis ,   Abdominal Normal abdominal exam  (+)   Peds  Hematology negative hematology ROS (+)   Anesthesia Other Findings   Reproductive/Obstetrics                            Anesthesia Physical Anesthesia Plan  ASA: 3  Anesthesia Plan: General   Post-op Pain Management:    Induction: Intravenous  PONV Risk Score and Plan: 4 or greater and Ondansetron and Midazolam  Airway Management Planned: LMA  Additional Equipment: None  Intra-op Plan:   Post-operative Plan: Extubation in OR  Informed Consent: I have reviewed the patients History and Physical, chart, labs and discussed the procedure including the risks, benefits and alternatives for the proposed anesthesia with the patient or authorized representative who has indicated his/her understanding and acceptance.     Dental advisory given  Plan Discussed with: CRNA  Anesthesia Plan Comments:        Anesthesia Quick Evaluation

## 2021-03-24 NOTE — Progress Notes (Signed)
PROGRESS NOTE  Harry [name]  Harry [name] DOB: [birth year] DOA: 03/11/2021 PCP: Buzzy Han, MD   Brief Narrative: Harry [name] is a 70 y.o. male with a history of HTN, COPD, previous tobacco use, lymphedema of legs, and severe right knee arthritis s/p TKA 01/07/2021 subsequently discharged to SNF and suffered anterior knee hardware dislocation on 8/1 that was successfully reduced in the ED. Unfortunately this recurred the following day with persistent patella alta on post reduction films at that time. On 8/3 he underwent revision to TKA, reconstruction of extensor mechanism and MCL. At his immediate postop visit, he was found to have some questionable skin flap viability. He was treated for a total of 6 weeks in a cylinder cast and had wound checks at regular intervals. On 9/14, he was discharged from rehab to home. On follow-up on 9/20, his wound was found to be fully demarcated and was therefore indicated for debridement. Next day on 9/21 he was brought the hospital for elective irrigation and debridement of right knee, though suffered hypotension and AFib with RVR upon induction of anesthesia prompting admission, cancellation of surgery. Metoprolol and lovenox started with improved rate control. Ultimately he underwent excisional debridement and placement of wound vac on 9/28 by Dr. Lyla Glassing. He continues on vancomycin and ceftriaxone.  Assessment & Plan: Principal Problem:   Necrosis of surgical wound ([place]) Active Problems:   Paroxysmal atrial fibrillation (HCC)   Tachycardia   Pressure injury of skin  Paroxysmal atrial flutter with RVR: Echo with preserved LVEF, G1DD, mild aortic root dilatation. TSH 1.043.  - Continue metoprolol 25mg  po BID. Remains NSR, stable. - Continue therapeutic lovenox (reinitiated 9/29 per ortho). Will switch to eliquis at DC for CHA2DS2-VASc score of 3 (age, HTN, atherosclerosis).  - Recommend outpatient sleep study - Has cardiology follow up  arranged 10/14.   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 Wound necrosis complicating healing now s/p I&D, wound vac placement 9/28.  - Continue vancomycin with MRSE growing in cultures. DC'ed ceftriaxone.  - Plastic surgery, Dr. Claudia Desanctis, consulted by orthopedics. Will attempt integra application in OR 95/6. - Wound vac management per orthopedics.  - WBAT RLE with knee immobilizer in place at all times - Continue tylenol for pain control.  - Protein supplementation ordered to aid in wound healing.   Hypokalemia:  - Supplement and monitor with Mg in AM to optimize for surgery.   Hypotension: Associated with anesthesia, RVR, since resolved.    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 Prominent eustachian valve was noted.  - Follow up per cardiology.    Chronic bilateral lower extremity lymphedema: Limited venous U/S showed no DVT. No PE on CTA chest.   COPD: Stable. No medications needed at this time. Wish to avoid beta agonists to avoid provocation of atrial dysrhythmia.  HLD:  -Continue statin. LDL is 39.  Nonobstructive CAD: 2v disease w/calcifications by CT. No anginal complaints.  - Continue statin, BB. ASA substituted with anticoagulation.     Anemia Vitamin B12 deficiency. -Patient had normal hemoglobin over 12 till the knee arthroplasty June 2022.  Since then his hemoglobin level has been running less than 9 mostly.  No evidence of active bleeding.  Hemoglobin currently between between 9 and 10.  Ferritin level appropriate at 66, vitamin B12 level at the lower limit of normal at 274.  Oral vitamin B12 has been started.  Insomnia - Melatonin as needed at bedtime.  RN Pressure Injury Documentation: Pressure Injury 03/11/21 Sacrum Mid Stage 3 -  Full thickness tissue loss. Subcutaneous fat may be visible but bone, tendon or  muscle are NOT exposed. Wound is 4.5cmX3.5cm     it is 80% red and about 20% white (Active)  03/11/21 2020  Location: Sacrum  Location Orientation: Mid  Staging: Stage 3 -  Full thickness tissue loss. Subcutaneous fat may be visible but bone, tendon or muscle are NOT exposed.  Wound Description (Comments): Wound is 4.5cmX3.5cm     it is 80% red and about 20% white  Present on Admission: Yes   Obesity: Estimated body mass index is 34.69 kg/m as calculated from the following:   Height as of this encounter: 6\' 4"  (1.93 m).   Weight as of this encounter: 129.3 kg.  DVT prophylaxis: Lovenox 1mg /kg q12h Code Status: Full Family Communication: Wife at bedside Disposition Plan:  Status is: Inpatient  Remains inpatient appropriate because:Inpatient level of care appropriate due to severity of illness  Dispo: The patient is from: Home              Anticipated d/c is to: Home              Patient currently is not medically stable to d/c.  Consultants:  Orthopedics Cardiology Plastics  Procedures:  03/18/21 IRRIGATION AND DEBRIDEMENT knee placement wound vac Rod Can, MD   Antimicrobials: Ceftriaxone 9/26 >> Vancomycin 9/28 >>   Subjective: No new complaints. NPO this morning. Pain controlled.   Objective: Vitals:   03/24/21 0241 03/24/21 0241 03/24/21 0430 03/24/21 1124  BP:  112/82 128/84 114/70  Pulse:  71 71 70  Resp:  18 20 17   Temp: 97.6 F (36.4 C)  97.7 F (36.5 C) 98.7 F (37.1 C)  TempSrc: Oral  Oral   SpO2:  96% 99% 99%  Weight:      Height:       Gen: 70 y.o. male in no distress Pulm: Nonlabored breathing room air. Clear. CV: Regular rate and rhythm. No murmur, rub, or gallop. No JVD, no dependent edema. GI: Abdomen soft, non-tender, non-distended, with normoactive bowel sounds.  Ext: Warm, dry, normal sensory and motor function throughout. Skin: No new rashes, lesions or ulcers on visualized skin. Wound not examined under KI. Neuro: Alert and  oriented. No focal neurological deficits. Psych: Judgement and insight appear fair. Mood euthymic & affect congruent. Behavior is appropriate.    Data Reviewed: I have personally reviewed following labs and imaging studies  CBC: Recent Labs  Lab 03/19/21 0425 03/20/21 0442 03/23/21 0430 03/24/21 0454  WBC 9.0 6.9 6.0 6.7  HGB 10.2* 9.2* 9.0* 9.6*  HCT 33.2* 30.3* 29.5* 31.2*  MCV 89.5 89.9 90.8 90.2  PLT 287 265 241 195   Basic Metabolic Panel: Recent Labs  Lab 03/19/21 0425 03/23/21 0430 03/24/21 0454  NA 138 137 142  K 4.0 3.0* 3.5  CL 105 110 115*  CO2 25 23 22   GLUCOSE 117* 94 96  BUN 13 13 13   CREATININE 0.74 0.64 0.81  CALCIUM 8.5* 8.0* 8.7*  MG  --   --  2.1   COVID PCR 9/21: Negative. Wound culture 9/28: No org on gram stain, rare MRSE sensitive to vancomycin, tetracycline.     LOS: 11 days   Time spent: 25 minutes.  Patrecia Pour, MD [facility] [email or web address] 03/24/2021, 12:19 PM

## 2021-03-24 NOTE — TOC Progression Note (Signed)
Transition of Care [facility]) - Progression Note    Patient Details  Name: Harry [name] MRN: [id number] Date of Birth: [birth year]  [facility] CM/SW Contact  Lennart Pall, LCSW Phone Number: 03/24/2021, 3:45 PM  Clinical Narrative:    Met with pt and wife this morning to review dc plans.  Both anticipating a home dc.  Pt having procedure today and unclear when he may be medically ready for dc.  It appears the plan will be for pt to have a home wound VAC - contacted Leontine Locket with [facility] [phone]) today who confirms that the [facility] has already been ordered and will likely be delivered to hospital room tomorrow. Have, also, confirmed that pt is active still with [facility] and have alerted [facility] of this as well (they had initiated a referral to another [facility] agency).  [facility] will follow for dc info and restart services at dc.  TOC will continue to follow.   Expected Discharge Plan: [place] Barriers to Discharge: Continued Medical Work up  Expected Discharge Plan and Services Expected Discharge Plan: [place] arrangements for the past 2 months: Single Family Home                                       Social Determinants of Health (SDOH) Interventions    Readmission Risk Interventions No flowsheet data found.

## 2021-03-25 ENCOUNTER — Encounter (HOSPITAL_COMMUNITY): Payer: Self-pay | Admitting: Plastic Surgery

## 2021-03-25 ENCOUNTER — Other Ambulatory Visit (HOSPITAL_COMMUNITY): Payer: Self-pay

## 2021-03-25 LAB — VANCOMYCIN, TROUGH: Vancomycin Tr: 21 ug/mL (ref 15–20)

## 2021-03-25 MED ORDER — VANCOMYCIN HCL IN DEXTROSE 1-5 GM/200ML-% IV SOLN
1000.0000 mg | Freq: Two times a day (BID) | INTRAVENOUS | Status: DC
  Filled 2021-03-25: qty 200

## 2021-03-25 NOTE — Progress Notes (Signed)
Pharmacy Antibiotic Note  Harry [name] is a 70 y.o. male admitted on 03/11/2021 with  wound infection .  Pharmacy has been consulted for vancomycin dosing.  Pt underwent I&D with placement of wound vac on 9/28. Currently on CTX, antibiotic coverage being broadened with the addition of vancomycin. Now s/p debridement, washout, and replace wound vac by plastic surgery.   03/22/21 Vancomycin trough= 20, dose of 1250mg  q12h continued  03/25/21 SCr remains stable as of yesterday, recheck trough = 21 (goal 15-20)  Plan: Decrease Vancomycin 1g IV q12h, next dose tomorrow at 6p SCr in AM Follow renal function, culture data.  F/U long term plans for antibiotics   Height: 6\' 4"  (193 cm) Weight: 129.3 kg (285 lb 0.9 oz) IBW/kg (Calculated) : 86.8  Temp (24hrs), Avg:97.9 F (36.6 C), Min:97.2 F (36.2 C), Max:98.4 F (36.9 C)  Recent Labs  Lab 03/19/21 0425 03/20/21 0442 03/22/21 1703 03/23/21 0430 03/24/21 0454 03/25/21 1736  WBC 9.0 6.9  --  6.0 6.7  --   CREATININE 0.74  --   --  0.64 0.81  --   VANCOTROUGH  --   --  20  --   --  21*     Estimated Creatinine Clearance: 126.4 mL/min (by C-G formula based on SCr of 0.81 mg/dL).    No Known Allergies  Antimicrobials this admission: ceftriaxone 9/26 >> 9/30 vancomycin 9/28 >>   Microbiology results: 9/28 Surgical deep wound: MRSE 9/28 Fungal stain: pending 10/4 R knee deep wound: ngtd  Thank you for allowing pharmacy to be a part of this patient's care.  Peggyann Juba, PharmD, BCPS Pharmacy: [phone] 03/25/2021 6:57 PM

## 2021-03-25 NOTE — Telephone Encounter (Signed)
Lorre Nick, Education officer, museum at [employer] a staff message on 10/4 stating that pt reported that he is established with [facility] and that he would like to stay with them. I adv her that I had not sent the referral to [facility] [facility] yet and wouldn't send it due to pt already being established with [facility]. She conveyed understanding.   See below for transcript of conversation:   Lowella Curb, [place], [place], [place] - thanks -       Previous Messages   ----- Message -----  From: Lindon Romp, [place]  Sent: 03/24/2021  12:52 PM EDT  To: Lowella Curb, LCSW  Subject: RE: [facility] referral                                 Hi Lucy,  The patient can stay with [facility]. Dr. Claudia Desanctis sent the request to our surgery scheduler and she sent it to the [facility]. I haven't sent the referral yet because one of our PA's said to hold off until after the SX to confirm if he was even being discharged today after the [place]. Thank you for following up with this and letting us know!   ----- Message -----  From: Rexene Alberts  Sent: 03/24/2021  12:41 PM EDT  To: Lindon Romp, CMA  Subject: [facility] referral                                     [place],  I just got this patient on my unit and I am seeing a note from you that [facility] had been contacted to follow.  This patient currently active with [facility] and he wants to stay with them.  Not sure how far the referral with [facility] got but can that be cancelled?   Thanks  Lennart Pall, [place]

## 2021-03-25 NOTE — Progress Notes (Signed)
PROGRESS NOTE    Harry [name]  XHB:[id number] DOB: 02/28/1951 DOA: 03/11/2021 PCP: Buzzy Han, MD   Brief Narrative:  Harry [name] is a 70 y.o. male with a history of HTN, COPD, previous tobacco use, lymphedema of legs, and severe right knee arthritis s/p TKA 01/07/2021 subsequently discharged to SNF and suffered anterior knee hardware dislocation on 8/1 that was successfully reduced in the ED.   Unfortunately this recurred the following day with persistent patella alta on post reduction films at that time. On 8/3 he underwent revision to TKA, reconstruction of extensor mechanism and MCL.   At his immediate postop visit, he was found to have some questionable skin flap viability. He was treated for a total of 6 weeks in a cylinder cast and had wound checks at regular intervals. On 9/14, he was discharged from rehab to home. On follow-up on 9/20, his wound was found to be fully demarcated and was therefore indicated for debridement.  The following day on 03/11/2021 he was brought the hospital for elective irrigation and debridement of right knee -subsequently suffering from hypotension and rapid A. fib with RVR in the setting of anesthetic induction requiring hospitalization and cancellation of the procedure.  Patient able to tolerate procedure on 03/18/2021 for debridement and wound VAC placement.  Plan for repeat evaluation and possible further surgical intervention per orthopedic surgical team and plastics.  Possible need for transition to tertiary care center pending repeat surgical procedure here given complexity and etiology of the wound.  Assessment & Plan:   Paroxysmal atrial flutter with RVR:  -Currently rate controlled, normal sinus rhythm - Echo with preserved LVEF, G1DD, mild aortic root dilatation. TSH 1.043.  - Continue metoprolol 25mg  po BID. - Continue therapeutic lovenox (reinitiated 9/29 per ortho). Will switch to eliquis at DC for CHA2DS2-VASc score of 3  (age, HTN, atherosclerosis).  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 Wound necrosis complicating healing now s/p I&D, wound vac placement 9/28.  - Continue vancomycin with MRSE growing in cultures. DC'ed ceftriaxone.  - Plastic surgery, Dr. Claudia Desanctis, consulted by orthopedics.  Unsuccessful integra application in OR 81/0 -will likely need tertiary care evaluation, transfer pending further discussion amongst surgical teams - Wound vac management per orthopedics.  - WBAT RLE with knee immobilizer in place at all times  Hypokalemia:  - Currently within normal limits, follow morning labs  Hypotension:  -Transient, in association with anesthetics, resolved  Questionable right atrial mass:  - Not seen on MRI which was limited by claustrophobia without contrast administered. Prominent eustachian valve was noted.  - Follow up per cardiology in the outpatient setting.   Chronic bilateral lower extremity lymphedema:  - Limited venous U/S showed no DVT.  - No PE identified on CTA chest.  COPD, not in acute exacerbation:  Stable. No medications needed at this time. Wish to avoid beta agonists to avoid provocation of atrial dysrhythmia.   HLD:  -Continue statin. LDL is 39.   Nonobstructive CAD:  -Two-vessel disease w/calcifications by CT. No anginal complaints.  - Continue statin, BB. ASA substituted with anticoagulation as above.     Anemia, chronic - secondary to vitamin B12 deficiency. Acute blood loss anemia, post procedure -Baseline hemoglobin around 12, post procedure hemoglobin around 9-10 without overt bleeding -B12 low normal, continue to supplement   Insomnia - Melatonin as needed at bedtime.  RN Pressure Injury Documentation: Pressure Injury 03/11/21 Sacrum Mid Stage 3 -  Full thickness tissue loss. Subcutaneous fat may be visible but bone,  tendon or muscle are NOT exposed. Wound is 4.5cmX3.5cm     it is 80% red and about 20% white (Active)  03/11/21 2020  Location: Sacrum  Location Orientation: Mid  Staging: Stage 3 -  Full thickness tissue loss. Subcutaneous fat may be visible but bone, tendon or muscle are NOT exposed.  Wound Description (Comments): Wound is 4.5cmX3.5cm     it is 80% red and about 20% white  Present on Admission: Yes    Obesity: Estimated body mass index is 34.69 kg/m as calculated from the following:   Height as of this encounter: 6\' 4"  (1.93 m).   Weight as of this encounter: 129.3 kg.  DVT prophylaxis: Lovenox full dose Code Status: Full Family Communication: None present  Status is: Inpatient  Dispo: The patient is from: Home              Anticipated d/c is to: To be determined              Anticipated d/c date is: To be determined              Patient currently not medically stable for discharge  Consultants:  Orthopedic surgery, plastic surgery  Procedures:  1.  Debridement right knee wound totaling 8 x 8 cm including skin and subcutaneous tissue 2.  Washout of right knee wound 3.  Placement of right knee wound wound VAC totaling 8 x 8 cm  Antimicrobials:  Vancomycin  Subjective: No acute issues or events overnight, pain currently well controlled, patient somewhat frustrated with his current situation but in good spirits, looking forward to repeat procedure pending further discussion with surgical specialties.  Denies nausea vomiting diarrhea constipation headache fevers chills chest pain shortness of breath.  Objective: Vitals:   03/24/21 1745 03/24/21 2308 03/25/21 0134 03/25/21 0503  BP: 123/86 117/72 114/81 124/80  Pulse: 71 63 70 62  Resp: 18 20 20 20   Temp: 97.7 F (36.5 C) 98.1 F (36.7 C) (!) 97.2 F (36.2 C) 97.8 F (36.6 C)  TempSrc:  Oral Oral Oral  SpO2: 97% 96% 96% 98%  Weight:      Height:        Intake/Output Summary (Last 24 hours) at 03/25/2021 0724 Last  data filed at 03/25/2021 0510 Gross per 24 hour  Intake 1260 ml  Output 1205 ml  Net 55 ml   Filed Weights   03/10/21 1510 03/24/21 1247  Weight: 129.3 kg 129.3 kg    Examination:  General:  Pleasantly resting in bed, No acute distress. HEENT:  Normocephalic atraumatic.  Sclerae nonicteric, noninjected.  Extraocular movements intact bilaterally. Neck:  Without mass or deformity.  Trachea is midline. Lungs:  Clear to auscultate bilaterally without rhonchi, wheeze, or rales. Heart:  Regular rate and rhythm.  Without murmurs, rubs, or gallops. Abdomen:  Soft, nontender, nondistended.  Without guarding or rebound. Extremities: Without cyanosis, clubbing, edema, right knee bandage clean dry intact with wound VAC intact, no discernible air leak Vascular:  Dorsalis pedis and posterior tibial pulses palpable bilaterally. Skin:  Warm and dry, no erythema, no ulcerations.   Data Reviewed: I have personally reviewed following labs and imaging studies  CBC: Recent Labs  Lab 03/19/21 0425 03/20/21 0442 03/23/21 0430 03/24/21 0454  WBC 9.0 6.9 6.0 6.7  HGB 10.2* 9.2* 9.0* 9.6*  HCT 33.2* 30.3* 29.5* 31.2*  MCV 89.5 89.9 90.8 90.2  PLT 287 265 241 726   Basic Metabolic Panel: Recent Labs  Lab 03/19/21 0425 03/23/21 0430 03/24/21 0454  NA 138 137 142  K 4.0 3.0* 3.5  CL 105 110 115*  CO2 25 23 22   GLUCOSE 117* 94 96  BUN 13 13 13   CREATININE 0.74 0.64 0.81  CALCIUM 8.5* 8.0* 8.7*  MG  --   --  2.1   GFR: Estimated Creatinine Clearance: 126.4 mL/min (by C-G formula based on SCr of 0.81 mg/dL). Liver Function Tests: No results for input(s): AST, ALT, ALKPHOS, BILITOT, PROT, ALBUMIN in the last 168 hours. No results for input(s): LIPASE, AMYLASE in the last 168 hours. No results for input(s): AMMONIA in the last 168 hours. Coagulation Profile: No results for input(s): INR, PROTIME in the last 168 hours. Cardiac Enzymes: No results for input(s): CKTOTAL, CKMB, CKMBINDEX,  TROPONINI in the last 168 hours. BNP (last 3 results) No results for input(s): PROBNP in the last 8760 hours. HbA1C: No results for input(s): HGBA1C in the last 72 hours. CBG: No results for input(s): GLUCAP in the last 168 hours. Lipid Profile: No results for input(s): CHOL, HDL, LDLCALC, TRIG, CHOLHDL, LDLDIRECT in the last 72 hours. Thyroid Function Tests: No results for input(s): TSH, T4TOTAL, FREET4, T3FREE, THYROIDAB in the last 72 hours. Anemia Panel: No results for input(s): VITAMINB12, FOLATE, FERRITIN, TIBC, IRON, RETICCTPCT in the last 72 hours. Sepsis Labs: No results for input(s): PROCALCITON, LATICACIDVEN in the last 168 hours.  Recent Results (from the past 240 hour(s))  Surgical PCR screen     Status: None   Collection Time: 03/17/21 10:39 AM   Specimen: Nasal Mucosa; Nasal Swab  Result Value Ref Range Status   MRSA, PCR NEGATIVE NEGATIVE Final   Staphylococcus aureus NEGATIVE NEGATIVE Final    Comment: (NOTE) The Xpert SA Assay (FDA approved for NASAL specimens in patients 49 years of age and older), is one component of a comprehensive surveillance program. It is not intended to diagnose infection nor to guide or monitor treatment. Performed at [facility], [place] [address] [place], [place] [address]   Fungus Culture With Stain     Status: None (Preliminary result)   Collection Time: 03/18/21  3:22 PM   Specimen: PATH Cytology Misc. fluid; Body Fluid  Result Value Ref Range Status   Fungus Stain Final report  Final    Comment: (NOTE) Performed At: [facility] [address], [place] [id number] Rush Farmer MD OE:[id number]    Fungus (Mycology) Culture PENDING  Incomplete   Fungal Source WOUND  Final    Comment: Performed at [facility], [place] [address], [place], [place] [address]  Aerobic/Anaerobic Culture w Gram Stain (surgical/deep wound)     Status: None   Collection Time: 03/18/21  3:22 PM    Specimen: PATH Cytology Misc. fluid; Body Fluid  Result Value Ref Range Status   Specimen Description   Final    WOUND Performed at [place] [address] [place], [place] [address]    Special Requests   Final    NONE Performed at [facility], [place] [address], [place], [place] [address]    Gram Stain   Final    RARE WBC PRESENT,BOTH PMN AND MONONUCLEAR NO ORGANISMS SEEN    Culture   Final    RARE STAPHYLOCOCCUS EPIDERMIDIS NO ANAEROBES ISOLATED Performed at [facility], [place],  [place] [address]    Report Status 03/23/2021 FINAL  Final   Organism ID, Bacteria STAPHYLOCOCCUS EPIDERMIDIS  Final      Susceptibility   Staphylococcus epidermidis - MIC*    CIPROFLOXACIN >=8 RESISTANT Resistant     ERYTHROMYCIN >=8 RESISTANT Resistant     GENTAMICIN 8 INTERMEDIATE Intermediate     OXACILLIN >=4 RESISTANT Resistant     TETRACYCLINE 2 SENSITIVE Sensitive     VANCOMYCIN 2 SENSITIVE Sensitive     TRIMETH/SULFA 80 RESISTANT Resistant     CLINDAMYCIN >=8 RESISTANT Resistant     RIFAMPIN <=0.5 SENSITIVE Sensitive     Inducible Clindamycin NEGATIVE Sensitive     * RARE STAPHYLOCOCCUS EPIDERMIDIS  Fungus Culture Result     Status: None   Collection Time: 03/18/21  3:22 PM  Result Value Ref Range Status   Result 1 Comment  Final    Comment: (NOTE) KOH/Calcofluor preparation:  no fungus observed. Performed At: [facility] [place], [place] [id number] Rush Farmer MD JQ:[id number]   Aerobic/Anaerobic Culture w Gram Stain (surgical/deep wound)     Status: None (Preliminary result)   Collection Time: 03/24/21  2:32 PM   Specimen: PATH Soft tissue  Result Value Ref Range Status   Specimen Description   Final    WOUND KNEE RIGHT Performed at [facility], [place] [address], [place], [place] [address]    Special Requests   Final    NONE Performed at [facility], [place] [address], [place], [place] [address]    Gram Stain   Final    MODERATE WBC PRESENT,BOTH PMN AND MONONUCLEAR NO ORGANISMS SEEN Performed at [facility], [place] [address] [place], [place] [address]    Culture PENDING  Incomplete   Report Status PENDING  Incomplete  Aerobic/Anaerobic Culture w Gram Stain (surgical/deep wound)     Status: None (Preliminary result)   Collection Time: 03/24/21  2:33 PM   Specimen: PATH Soft tissue  Result Value Ref Range Status   Specimen Description   Final    WOUND KNEE RIGHT DEEP Performed at [facility], [place] [address] [place], [place] [address]    Special Requests   Final    NONE Performed at [facility], [place] [address], [place], [place] [address]    Gram Stain   Final    FEW WBC PRESENT,BOTH PMN AND MONONUCLEAR NO ORGANISMS SEEN Performed at [facility], [place] [address], [place], [place] [address]    Culture PENDING  Incomplete   Report Status PENDING  Incomplete     Radiology Studies: No results found.  Scheduled Meds:  (feeding supplement) PROSource Plus  30 mL Oral BID BM   atorvastatin  20 mg Oral Q1500   docusate sodium  100 mg Oral BID   enoxaparin (LOVENOX) injection  120 mg Subcutaneous Q12H   metoprolol tartrate  12.5 mg Oral BID   pantoprazole  40 mg Oral Daily   senna  1 tablet Oral BID   vitamin B-12  1,000 mcg Oral Daily   Continuous Infusions:  vancomycin 1,250 mg (03/25/21 0505)     LOS: 12 days   Time spent: 45min  Brenlynn Fake C Danika Kluender, DO [facility]  If 7PM-7AM, please contact night-coverage [email or web address]  03/25/2021, 7:24 AM

## 2021-03-25 NOTE — Progress Notes (Signed)
Pharmacy Antibiotic Note  Harry [name] is a 70 y.o. male admitted on 03/11/2021 with  wound infection .  Pharmacy has been consulted for vancomycin dosing.  Pt underwent I&D with placement of wound vac on 9/28. Currently on CTX, antibiotic coverage being broadened with the addition of vancomycin. Now s/p debridement, washout, and replace wound vac by plastic surgery.   03/22/21 Vancomycin trough= 20  03/25/21 SCr remains stable as of yesterday  Plan: Continue Vancomycin 1250 mg IV q12h SCr in AM Follow renal function, culture data.  F/U long term plans for antibiotics   Height: 6\' 4"  (193 cm) Weight: 129.3 kg (285 lb 0.9 oz) IBW/kg (Calculated) : 86.8  Temp (24hrs), Avg:97.8 F (36.6 C), Min:97.2 F (36.2 C), Max:98.4 F (36.9 C)  Recent Labs  Lab 03/19/21 0425 03/20/21 0442 03/22/21 1703 03/23/21 0430 03/24/21 0454  WBC 9.0 6.9  --  6.0 6.7  CREATININE 0.74  --   --  0.64 0.81  VANCOTROUGH  --   --  20  --   --      Estimated Creatinine Clearance: 126.4 mL/min (by C-G formula based on SCr of 0.81 mg/dL).    No Known Allergies  Antimicrobials this admission: ceftriaxone 9/26 >> 9/30 vancomycin 9/28 >>   Microbiology results: 9/28 Surgical deep wound: MRSE 9/28 Fungal stain: pending 10/4 wound:   Thank you for allowing pharmacy to be a part of this patient's care.  [name]  03/25/2021 1:46 PM

## 2021-03-25 NOTE — Progress Notes (Signed)
Critical lab value called to 3W of high Vanc trough of 21. Notified pharmacist and hospitalist Dr. Avon Gully.

## 2021-03-25 NOTE — Progress Notes (Signed)
Physical Therapy Treatment Patient Details Name: Harry [name] MRN: [id number] DOB: April 07, 1951 Today's Date: 03/25/2021   History of Present Illness Harry [name] is a 70 y.o. male with a history of HTN, COPD, previous tobacco use, lymphedema of legs, and severe right knee arthritis s/p TKA 01/07/2021 subsequently discharged to SNF and suffered anterior knee hardware dislocation on 8/1 that was successfully reduced in the ED.      Unfortunately this recurred the following day with persistent patella alta on post reduction films at that time. On 8/3 he underwent revision to TKA, reconstruction of extensor mechanism and MCL.      At his immediate postop visit, he was found to have some questionable skin flap viability. He was treated for a total of 6 weeks in a cylinder cast and had wound checks at regular intervals. On 9/14, he was discharged from rehab to home. On follow-up on 9/20, his wound was found to be fully demarcated and was therefore indicated for debridement.  The following day on 03/11/2021 he was brought the hospital for elective irrigation and debridement of right knee -subsequently suffering from hypotension and rapid A. fib with RVR in the setting of anesthetic induction requiring hospitalization and cancellation of the procedure.  Patient able to tolerate procedure on 03/18/2021 for debridement and wound VAC placement.  Plan for repeat evaluation and possible further surgical intervention per orthopedic surgical team and plastics.  Possible need for transition to tertiary care center pending repeat surgical procedure here given complexity and etiology of the wound.    PT Comments    Pt is very knowledgeable due to multiple knee surgeries.  He politely declined any OOB activity due to comfort but did ask that I show him "the workings" of his wheelchair.  Product manager mobility: Yes Wheelchair Assistance Details (indicate cue type and reason): pt had multiple questions  about his wheelchair.  "They dropped it off and I have had for a while but no one showed me the parts".  Educated on locking brakes any time he is stationary.  Educated and demonstrated how to elevate and lower leg rest.  Because the leg rest can lower QUICKLY, I advised him to remove his leg off first before lowering. Educated on how to push knob to swival leg rest away and back in but to be sure "it clicks".  Educated on how to remove and reapply leg rest.  Demonstarted several times with repeat instruction.  Also adjusted leg rest length.   Recommendations for follow up therapy are one component of a multi-disciplinary discharge planning process, led by the attending physician.  Recommendations may be updated based on patient status, additional functional criteria and insurance authorization.  Follow Up Recommendations        Equipment Recommendations  None recommended by PT    Recommendations for Other Services       Precautions / Restrictions Precautions Precautions: Knee;Fall Precaution Comments: VAC to right LE, post op shoe right LE Required Braces or Orthoses: Knee Immobilizer - Right Knee Immobilizer - Right: On at all times Restrictions Weight Bearing Restrictions: No RLE Weight Bearing: Weight bearing as tolerated Other Position/Activity Restrictions: with KI and Audiological scientist  pt had multiple questions about his wheelchair.  "They dropped it off and I have had for a while but no one showed me the parts".  Educated on locking brakes any time  he is stationary.  Educated and demonstrated how to elevate and lower leg rest.  Educated on how to push knob to swival leg rest away and back in but to be sure "it clicks".  Educated on how to remove and reapply leg rest.  Demonstarted several times with repeat instruction.  Also adjusted leg rest length.  Modified Rankin (Stroke Patients Only)       Balance                                             Cognition Arousal/Alertness: Awake/alert Behavior During Therapy: WFL for tasks assessed/performed Overall Cognitive Status: Within Functional Limits for tasks assessed                                 General Comments: A x O  x 3 familiar from previous admit      Exercises      General Comments        Pertinent Vitals/Pain Pain Assessment: 0-10 Pain Score: 5  Pain Location: R knee Pain Descriptors / Indicators: Aching;Grimacing Pain Intervention(s): Monitored during session;Repositioned                          Prior Function            PT Goals (current goals can now be found in the care plan section) Progress towards PT goals: Progressing toward goals    Frequency    Min 5X/week      PT Plan Current plan remains appropriate    Co-evaluation              AM-PAC PT "6 Clicks" Mobility   Outcome Measure  Help needed turning from your back to your side while in a flat bed without using bedrails?: A Little Help needed moving from lying on your back to sitting on the side of a flat bed without using bedrails?: A Little Help needed moving to and from a bed to a chair (including a wheelchair)?: A Little Help needed standing up from a chair using your arms (e.g., wheelchair or bedside chair)?: A Little Help needed to walk in hospital room?: A Little Help needed climbing 3-5 steps with a railing? : A Lot 6 Click Score: 17    End of Session Equipment Utilized During Treatment: Other (comment) (wheelchair) Activity Tolerance: Patient tolerated treatment well Patient left: in bed;with call bell/phone within reach;with bed alarm set   PT Visit Diagnosis: Other abnormalities of gait and mobility (R26.89) Pain - Right/Left: Right Pain - part of body: Knee     Time: 1150-1205 PT Time Calculation (min) (ACUTE ONLY): 15 min  Charges:  $Wheel Chair Management: 8-22 mins                     Rica Koyanagi   PTA Acute  Rehabilitation Services Pager      [phone] Office      [phone]

## 2021-03-25 NOTE — Care Management Important Message (Signed)
Important Message  Patient Details IM Letter given to the Patient. Name: Harry [name] MRN: [id number] Date of Birth: 06-Oct-1950   Medicare Important Message Given:  Yes     [name] 03/25/2021, 1:56 PM

## 2021-03-26 ENCOUNTER — Inpatient Hospital Stay: Payer: Self-pay

## 2021-03-26 LAB — CBC
HCT: 31.2 % — ABNORMAL LOW (ref 39.0–52.0)
Hemoglobin: 9.3 g/dL — ABNORMAL LOW (ref 13.0–17.0)
MCH: 26.7 pg (ref 26.0–34.0)
MCHC: 29.8 g/dL — ABNORMAL LOW (ref 30.0–36.0)
MCV: 89.7 fL (ref 80.0–100.0)
Platelets: 247 10*3/uL (ref 150–400)
RBC: 3.48 MIL/uL — ABNORMAL LOW (ref 4.22–5.81)
RDW: 16.3 % — ABNORMAL HIGH (ref 11.5–15.5)
WBC: 6.3 10*3/uL (ref 4.0–10.5)
nRBC: 0 % (ref 0.0–0.2)

## 2021-03-26 LAB — BASIC METABOLIC PANEL
Anion gap: 6 (ref 5–15)
BUN: 15 mg/dL (ref 8–23)
CO2: 21 mmol/L — ABNORMAL LOW (ref 22–32)
Calcium: 8.3 mg/dL — ABNORMAL LOW (ref 8.9–10.3)
Chloride: 111 mmol/L (ref 98–111)
Creatinine, Ser: 0.62 mg/dL (ref 0.61–1.24)
GFR, Estimated: >60 mL/min (ref >60)
Glucose, Bld: 94 mg/dL (ref 70–99)
Potassium: 3.3 mmol/L — ABNORMAL LOW (ref 3.5–5.1)
Sodium: 138 mmol/L (ref 135–145)

## 2021-03-26 MED ORDER — VANCOMYCIN IV (FOR PTA / DISCHARGE USE ONLY): 1500.0000 mg | INTRAVENOUS | 0 refills | Status: DC

## 2021-03-26 MED ORDER — VANCOMYCIN HCL 1500 MG/300ML IV SOLN
1500.0000 mg | INTRAVENOUS | Status: DC
  Administered 2021-03-26 – 2021-03-28 (×3): 1500 mg via INTRAVENOUS
  Filled 2021-03-26 (×3): qty 300

## 2021-03-26 NOTE — TOC Progression Note (Signed)
Transition of Care [facility]) - Progression Note    Patient Details  Name: Harry [name] MRN: [id number] Date of Birth: 10-Mar-1951  Transition of Care [facility]) CM/SW Contact  Leeroy Cha, RN Phone Number: 03/26/2021, 12:27 PM  Clinical Narrative:    Carolynn Sayers alerted that patient going home on iv abx to get picc today   Expected Discharge Plan: [place] Barriers to Discharge: Continued Medical Work up  Expected Discharge Plan and Services Expected Discharge Plan: [place] arrangements for the past 2 months: Single Family Home                                       Social Determinants of Health (SDOH) Interventions    Readmission Risk Interventions No flowsheet data found.

## 2021-03-26 NOTE — Progress Notes (Addendum)
PHARMACY CONSULT NOTE FOR:  Vancomycin  OUTPATIENT  PARENTERAL ANTIBIOTIC THERAPY (OPAT)  Indication: Knee infection Regimen: vancomycin 1.5g IV q24h (changed from 1g IV q12h as levels tend to accumluate over time) End date: 05/07/21  IV antibiotic discharge orders are pended. To discharging provider:  please sign these orders via discharge navigator,  Select New Orders & click on the button choice - Manage This Unsigned Work.     Thank you for allowing pharmacy to be a part of this patient's care.  Candie Mile 03/26/2021, 1:15 PM

## 2021-03-26 NOTE — Progress Notes (Signed)
PROGRESS NOTE    Harry [name]  TGG:[id number] DOB: 09/17/1950 DOA: 03/11/2021 PCP: Buzzy Han, MD   Brief Narrative:  Harry [name] is a 70 y.o. male with a history of HTN, COPD, previous tobacco use, lymphedema of legs, and severe right knee arthritis s/p TKA 01/07/2021 subsequently discharged to SNF and suffered anterior knee hardware dislocation on 8/1 that was successfully reduced in the ED.   Unfortunately this recurred the following day with persistent patella alta on post reduction films at that time. On 8/3 he underwent revision to TKA, reconstruction of extensor mechanism and MCL.   At his immediate postop visit, he was found to have some questionable skin flap viability. He was treated for a total of 6 weeks in a cylinder cast and had wound checks at regular intervals. On 9/14, he was discharged from rehab to home. On follow-up on 9/20, his wound was found to be fully demarcated and was therefore indicated for debridement.  The following day on 03/11/2021 he was brought the hospital for elective irrigation and debridement of right knee -subsequently suffering from hypotension and rapid A. fib with RVR in the setting of anesthetic induction requiring hospitalization and cancellation of the procedure.  Patient able to tolerate procedure on 03/18/2021 for debridement and wound VAC placement.  Plan for repeat evaluation and possible further surgical intervention per orthopedic surgical team and plastics.  Possible need for transition to tertiary care center pending repeat surgical procedure here given complexity and etiology of the wound.  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 Wound necrosis complicating healing now s/p I&D, wound vac placement 9/28.  - Continue vancomycin with MRSE growing in cultures. DC'ed ceftriaxone.  - Plastic surgery, Dr. Claudia Desanctis,  consulted by orthopedics.  Unsuccessful integra application in OR 70/3 - Wound vac management per orthopedics.  - WBAT RLE with knee immobilizer in place at all times - Plan for disposition to [facility] for definitive care of knee infection, will continue IV vancomycin via PICC line at home with outpatient follow-up over the next few weeks, will likely need 6 to 8 weeks of antibiotics total but this will depend on further surgical outpatient evaluation and intervention.  Paroxysmal atrial flutter with RVR, resolved:  -Currently rate controlled, normal sinus rhythm - Echo with preserved LVEF, G1DD, mild aortic root dilatation. TSH 1.043.  - Continue metoprolol 25mg  po BID. - Continue therapeutic lovenox (reinitiated 9/29 per ortho). Will likely need to switch to eliquis at DC for CHA2DS2-VASc score of 3 (age, HTN, atherosclerosis).  - Has cardiology follow up arranged 10/14   Hypokalemia:  - Currently within normal limits, follow morning labs  Hypotension:  -Transient, in association with anesthetics, resolved  Questionable right atrial mass:  - Not seen on MRI which was limited by claustrophobia without contrast administered. Prominent eustachian valve was noted.  - Follow up per cardiology in the outpatient setting.   Chronic bilateral lower extremity lymphedema:  - Limited venous U/S showed no DVT.  - No PE identified on CTA chest.  COPD, not in acute exacerbation:  Stable. No medications needed at this time. Wish to avoid beta agonists to avoid provocation of atrial dysrhythmia.   HLD:  -Continue statin. LDL is 39.   Nonobstructive CAD:  -Two-vessel disease w/calcifications by CT. No anginal complaints.  - Continue statin, BB. ASA substituted with anticoagulation as above.     Anemia, chronic - secondary  to vitamin B12 deficiency. Acute blood loss anemia, post procedure -Baseline hemoglobin around 12, post procedure hemoglobin around 9-10 without overt bleeding -B12 low  normal, continue to supplement   Insomnia - Melatonin as needed at bedtime.   RN Pressure Injury Documentation: Pressure Injury 03/11/21 Sacrum Mid Stage 3 -  Full thickness tissue loss. Subcutaneous fat may be visible but bone, tendon or muscle are NOT exposed. Wound is 4.5cmX3.5cm     it is 80% red and about 20% white (Active)  03/11/21 2020  Location: Sacrum  Location Orientation: Mid  Staging: Stage 3 -  Full thickness tissue loss. Subcutaneous fat may be visible but bone, tendon or muscle are NOT exposed.  Wound Description (Comments): Wound is 4.5cmX3.5cm     it is 80% red and about 20% white  Present on Admission: Yes    Obesity: Estimated body mass index is 34.69 kg/m as calculated from the following:   Height as of this encounter: 6\' 4"  (1.93 m).   Weight as of this encounter: 129.3 kg.  DVT prophylaxis: Lovenox full dose Code Status: Full Family Communication: None present  Status is: Inpatient  Dispo: The patient is from: Home              Anticipated d/c is to: To be determined              Anticipated d/c date is: To be determined              Patient currently not medically stable for discharge  Consultants:  Orthopedic surgery, plastic surgery  Procedures:  1.  Debridement right knee wound totaling 8 x 8 cm including skin and subcutaneous tissue 2.  Washout of right knee wound 3.  Placement of right knee wound wound VAC totaling 8 x 8 cm  Antimicrobials:  Vancomycin  Subjective: No acute issues or events overnight, pain continues to be well controlled at this time, patient remains in good spirits, looking forward to further definitive care at outpatient facility in [place].  Objective: Vitals:   03/25/21 0503 03/25/21 1327 03/25/21 1927 03/26/21 0524  BP: 124/80 102/68 101/67 112/82  Pulse: 62 66 64 64  Resp: 20 17 17 16   Temp: 97.8 F (36.6 C) 98.4 F (36.9 C) 98 F (36.7 C) 98 F (36.7 C)  TempSrc: Oral Oral Oral   SpO2: 98% 97% 98% 100%   Weight:      Height:        Intake/Output Summary (Last 24 hours) at 03/26/2021 0721 Last data filed at 03/26/2021 0220 Gross per 24 hour  Intake 480 ml  Output 1200 ml  Net -720 ml    Filed Weights   03/10/21 1510 03/24/21 1247  Weight: 129.3 kg 129.3 kg    Examination:  General:  Pleasantly resting in bed, No acute distress. HEENT:  Normocephalic atraumatic.  Sclerae nonicteric, noninjected.  Extraocular movements intact bilaterally. Neck:  Without mass or deformity.  Trachea is midline. Lungs:  Clear to auscultate bilaterally without rhonchi, wheeze, or rales. Heart:  Regular rate and rhythm.  Without murmurs, rubs, or gallops. Abdomen:  Soft, nontender, nondistended.  Without guarding or rebound. Extremities: Without cyanosis, clubbing, edema, right knee bandage clean dry intact with wound VAC intact, no discernible air leak Vascular:  Dorsalis pedis and posterior tibial pulses palpable bilaterally. Skin:  Warm and dry, no erythema, no ulcerations.   Data Reviewed: I have personally reviewed following labs and imaging studies  CBC: Recent Labs  Lab 03/20/21 0442  03/23/21 0430 03/24/21 0454 03/26/21 0316  WBC 6.9 6.0 6.7 6.3  HGB 9.2* 9.0* 9.6* 9.3*  HCT 30.3* 29.5* 31.2* 31.2*  MCV 89.9 90.8 90.2 89.7  PLT 265 241 248 528    Basic Metabolic Panel: Recent Labs  Lab 03/23/21 0430 03/24/21 0454 03/26/21 0316  NA 137 142 138  K 3.0* 3.5 3.3*  CL 110 115* 111  CO2 23 22 21*  GLUCOSE 94 96 94  BUN 13 13 15   CREATININE 0.64 0.81 0.62  CALCIUM 8.0* 8.7* 8.3*  MG  --  2.1  --     GFR: Estimated Creatinine Clearance: 127.9 mL/min (by C-G formula based on SCr of 0.62 mg/dL). Liver Function Tests: No results for input(s): AST, ALT, ALKPHOS, BILITOT, PROT, ALBUMIN in the last 168 hours. No results for input(s): LIPASE, AMYLASE in the last 168 hours. No results for input(s): AMMONIA in the last 168 hours. Coagulation Profile: No results for input(s): INR,  PROTIME in the last 168 hours. Cardiac Enzymes: No results for input(s): CKTOTAL, CKMB, CKMBINDEX, TROPONINI in the last 168 hours. BNP (last 3 results) No results for input(s): PROBNP in the last 8760 hours. HbA1C: No results for input(s): HGBA1C in the last 72 hours. CBG: No results for input(s): GLUCAP in the last 168 hours. Lipid Profile: No results for input(s): CHOL, HDL, LDLCALC, TRIG, CHOLHDL, LDLDIRECT in the last 72 hours. Thyroid Function Tests: No results for input(s): TSH, T4TOTAL, FREET4, T3FREE, THYROIDAB in the last 72 hours. Anemia Panel: No results for input(s): VITAMINB12, FOLATE, FERRITIN, TIBC, IRON, RETICCTPCT in the last 72 hours. Sepsis Labs: No results for input(s): PROCALCITON, LATICACIDVEN in the last 168 hours.  Recent Results (from the past 240 hour(s))  Surgical PCR screen     Status: None   Collection Time: 03/17/21 10:39 AM   Specimen: Nasal Mucosa; Nasal Swab  Result Value Ref Range Status   MRSA, PCR NEGATIVE NEGATIVE Final   Staphylococcus aureus NEGATIVE NEGATIVE Final    Comment: (NOTE) The Xpert SA Assay (FDA approved for NASAL specimens in patients 6 years of age and older), is one component of a comprehensive surveillance program. It is not intended to diagnose infection nor to guide or monitor treatment. Performed at [facility], [place] [address], [place], [place] [address]   Fungus Culture With Stain     Status: None (Preliminary result)   Collection Time: 03/18/21  3:22 PM   Specimen: PATH Cytology Misc. fluid; Body Fluid  Result Value Ref Range Status   Fungus Stain Final report  Final    Comment: (NOTE) Performed At: [facility] [address], [place] [id number] Rush Farmer MD IH:[id number]    Fungus (Mycology) Culture PENDING  Incomplete   Fungal Source WOUND  Final    Comment: Performed at [facility], [place] [address], [place], [place] [address]  Aerobic/Anaerobic  Culture w Gram Stain (surgical/deep wound)     Status: None   Collection Time: 03/18/21  3:22 PM   Specimen: PATH Cytology Misc. fluid; Body Fluid  Result Value Ref Range Status   Specimen Description   Final    WOUND Performed at [place] [address], [place], [place] [address]    Special Requests   Final    NONE Performed at [facility], [place] [address] [place], [place] [address]    Gram Stain   Final    RARE WBC PRESENT,BOTH PMN AND MONONUCLEAR NO ORGANISMS SEEN  Culture   Final    RARE STAPHYLOCOCCUS EPIDERMIDIS NO ANAEROBES ISOLATED Performed at [facility], [place] [address], [place], [place] [address]    Report Status 03/23/2021 FINAL  Final   Organism ID, Bacteria STAPHYLOCOCCUS EPIDERMIDIS  Final      Susceptibility   Staphylococcus epidermidis - MIC*    CIPROFLOXACIN >=8 RESISTANT Resistant     ERYTHROMYCIN >=8 RESISTANT Resistant     GENTAMICIN 8 INTERMEDIATE Intermediate     OXACILLIN >=4 RESISTANT Resistant     TETRACYCLINE 2 SENSITIVE Sensitive     VANCOMYCIN 2 SENSITIVE Sensitive     TRIMETH/SULFA 80 RESISTANT Resistant     CLINDAMYCIN >=8 RESISTANT Resistant     RIFAMPIN <=0.5 SENSITIVE Sensitive     Inducible Clindamycin NEGATIVE Sensitive     * RARE STAPHYLOCOCCUS EPIDERMIDIS  Fungus Culture Result     Status: None   Collection Time: 03/18/21  3:22 PM  Result Value Ref Range Status   Result 1 Comment  Final    Comment: (NOTE) KOH/Calcofluor preparation:  no fungus observed. Performed At: [facility] [place], [place] [id number] Rush Farmer MD UT:[id number]   Aerobic/Anaerobic Culture w Gram Stain (surgical/deep wound)     Status: None (Preliminary result)   Collection Time: 03/24/21  2:32 PM   Specimen: PATH Soft tissue  Result Value Ref Range Status   Specimen Description   Final    WOUND KNEE RIGHT Performed at [facility], [place] [address],  [place], [place] [address]    Special Requests   Final    NONE Performed at [facility], [place] [address] [place], [place] [address]    Gram Stain   Final    MODERATE WBC PRESENT,BOTH PMN AND MONONUCLEAR NO ORGANISMS SEEN    Culture   Final    NO GROWTH < 24 HOURS Performed at [facility], [place] [address] [place], [place] [address]    Report Status PENDING  Incomplete  Aerobic/Anaerobic Culture w Gram Stain (surgical/deep wound)     Status: None (Preliminary result)   Collection Time: 03/24/21  2:33 PM   Specimen: PATH Soft tissue  Result Value Ref Range Status   Specimen Description   Final    WOUND KNEE RIGHT DEEP Performed at [place] [address], [place], [place] [address]    Special Requests   Final    NONE Performed at [facility], [place] [address], [place], [place] [address]    Gram Stain   Final    FEW WBC PRESENT,BOTH PMN AND MONONUCLEAR NO ORGANISMS SEEN    Culture   Final    NO GROWTH < 24 HOURS Performed at [facility], [place] [address], [place], [place] [address]    Report Status PENDING  Incomplete      Radiology Studies: No results found.  Scheduled Meds:  (feeding supplement) PROSource Plus  30 mL Oral BID BM   atorvastatin  20 mg Oral Q1500   docusate sodium  100 mg Oral BID   enoxaparin (LOVENOX) injection  120 mg Subcutaneous Q12H   metoprolol tartrate  12.5 mg Oral BID   pantoprazole  40 mg Oral Daily   senna  1 tablet Oral BID   vitamin B-12  1,000 mcg Oral Daily   Continuous Infusions:  vancomycin       LOS: 13 days   Time spent: 28min  L[place]ty Seto C Aiken Withem, DO [facility]  If 7PM-7AM, please contact night-coverage [email or web address]  03/26/2021, 7:21 AM

## 2021-03-26 NOTE — Progress Notes (Signed)
ANTICOAGULATION CONSULT NOTE - Follow Up Consult  Pharmacy Consult for Lovenox Indication: atrial fibrillation  No Known Allergies  Patient Measurements: Height: 6\' 4"  (193 cm) Weight: 129.3 kg (285 lb 0.9 oz) IBW/kg (Calculated) : 86.8  Vital Signs: Temp: 98 F (36.7 C) (10/06 0524) BP: 112/82 (10/06 0524) Pulse Rate: 64 (10/06 0524)  Labs: Recent Labs    03/24/21 0454 03/26/21 0316  HGB 9.6* 9.3*  HCT 31.2* 31.2*  PLT 248 247  CREATININE 0.81 0.62     Estimated Creatinine Clearance: 127.9 mL/min (by C-G formula based on SCr of 0.62 mg/dL).   Assessment: 70 yo male started on full-dose Lovenox for new onset afib with RVR following induction of anesthesia for elective I&D .  Today, 03/26/2021 CBC stable, Hgb remains low/stable at 9.3  Plt WNL. SCr stable, CrCl > 100 ml/min No bleeding documented  Goal of Therapy:  Anti-Xa level 0.6-1 units/ml 4hrs after LMWH dose given Monitor platelets by anticoagulation protocol: Yes   Plan:  Continue Lovenox 120mg  SQ q12h (dose per Dr. Lyla Glassing resumed 9/29 POD1) Monitor CBC, sign/symptoms of bleeding Follow up plans for anticoagulation around surgery and plans for DOAC prior to discharge.   Eudelia Bunch, Pharm.D 03/26/2021 9:56 AM

## 2021-03-27 MED ORDER — SODIUM CHLORIDE 0.9% FLUSH
10.0000 mL | Freq: Two times a day (BID) | INTRAVENOUS | Status: DC
  Administered 2021-03-28 – 2021-03-31 (×7): 10 mL

## 2021-03-27 MED ORDER — SODIUM CHLORIDE 0.9% FLUSH
10.0000 mL | INTRAVENOUS | Status: DC | PRN
  Administered 2021-03-28 – 2021-03-30 (×2): 10 mL

## 2021-03-27 MED ORDER — CHLORHEXIDINE GLUCONATE CLOTH 2 % EX PADS
6.0000 | MEDICATED_PAD | Freq: Every day | CUTANEOUS | Status: DC
  Administered 2021-03-27 – 2021-03-31 (×5): 6 via TOPICAL

## 2021-03-27 NOTE — Progress Notes (Signed)
Peripherally Inserted Central Catheter Placement  The IV Nurse has discussed with the patient and/or persons authorized to consent for the patient, the purpose of this procedure and the potential benefits and risks involved with this procedure.  The benefits include less needle sticks, lab draws from the catheter, and the patient may be discharged home with the catheter. Risks include, but not limited to, infection, bleeding, blood clot (thrombus formation), and puncture of an artery; nerve damage and irregular heartbeat and possibility to perform a PICC exchange if needed/ordered by physician.  Alternatives to this procedure were also discussed.  Bard Power PICC patient education guide, fact sheet on infection prevention and patient information card has been provided to patient /or left at bedside.    PICC Placement Documentation  PICC Single Lumen 07/57/32 Right Basilic 43 cm 0 cm (Active)  Indication for Insertion or Continuance of Line Home intravenous therapies (PICC only) 03/27/21 1541  Exposed Catheter (cm) 0 cm 03/27/21 1541  Site Assessment Clean;Dry;Intact 03/27/21 1541  Line Status Flushed;Saline locked;Blood return noted 03/27/21 1541  Dressing Type Transparent;Securing device 03/27/21 1541  Dressing Status Clean;Dry;Intact 03/27/21 1541  Antimicrobial disc in place? Yes 03/27/21 1541  Safety Lock Not Applicable 25/67/20 9198  Line Care Connections checked and tightened 03/27/21 1541  Dressing Intervention New dressing 03/27/21 1541  Dressing Change Due 04/03/21 03/27/21 [place], [place] 03/27/2021, 3:43 PM

## 2021-03-27 NOTE — Progress Notes (Signed)
PROGRESS NOTE    Harry [name]  AYT:[id number] DOB: 08/13/1950 DOA: 03/11/2021 PCP: Buzzy Han, MD   Brief Narrative:  Harry [name] is a 70 y.o. male with a history of HTN, COPD, previous tobacco use, lymphedema of legs, and severe right knee arthritis s/p TKA 01/07/2021 subsequently discharged to SNF and suffered anterior knee hardware dislocation on 8/1 that was successfully reduced in the ED.   Unfortunately this recurred the following day with persistent patella alta on post reduction films at that time. On 8/3 he underwent revision to TKA, reconstruction of extensor mechanism and MCL.   At his immediate postop visit, he was found to have some questionable skin flap viability. He was treated for a total of 6 weeks in a cylinder cast and had wound checks at regular intervals. On 9/14, he was discharged from rehab to home. On follow-up on 9/20, his wound was found to be fully demarcated and was therefore indicated for debridement.  The following day on 03/11/2021 he was brought the hospital for elective irrigation and debridement of right knee -subsequently suffering from hypotension and rapid A. fib with RVR in the setting of anesthetic induction requiring hospitalization and cancellation of the procedure.  Patient able to tolerate procedure on 03/18/2021 for debridement and wound VAC placement.  Given complex wound and difficulty during surgery as outlined below patient is going to follow-up with specialist in [place] per discussion with orthopedic surgery.  Pending PICC line placement IV antibiotic administration and set up in the outpatient setting as well as definitive follow-up at [facility] office will discharge patient home with wound VAC.  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 Wound necrosis complicating healing now s/p I&D, wound vac placement 9/28.  -  Continue vancomycin with MRSE growing in cultures. DC'ed ceftriaxone.  - Plastic surgery, Dr. Claudia Desanctis, consulted by orthopedics.  Unsuccessful integra application in OR 35/5 - Wound vac management per orthopedics.  - WBAT RLE with knee immobilizer in place at all times - Plan for disposition to [facility] for definitive care of knee infection, will continue IV vancomycin via PICC line at home with outpatient follow-up over the next few weeks, will likely need 6 to 8 weeks of antibiotics total but this will depend on further surgical outpatient evaluation and intervention.  Paroxysmal atrial flutter with RVR, resolved:  -Currently rate controlled, normal sinus rhythm - Echo with preserved LVEF, G1DD, mild aortic root dilatation. TSH 1.043.  - Continue metoprolol 25mg  po BID. - Continue therapeutic lovenox (reinitiated 9/29 per ortho). Will likely need to switch to eliquis at DC for CHA2DS2-VASc score of 3 (age, HTN, atherosclerosis).  - Has cardiology follow up arranged 10/14   Hypokalemia:  -Continue to replete as appropriate  Hypotension:  -Transient, in association with anesthetics, resolved  Questionable right atrial mass:  - Not seen on MRI which was unfortunately limited by claustrophobia and was also done without contrast administration. Prominent eustachian valve was noted.  - Follow up per cardiology in the outpatient setting.   Chronic bilateral lower extremity lymphedema:  - Limited venous U/S showed no DVT.  - No PE identified on CTA chest.  COPD, not in acute exacerbation:  Stable. No medications needed at this time. Wish to avoid beta agonists to avoid provocation of atrial dysrhythmia.   HLD:  -Continue statin. LDL is 39.   Nonobstructive CAD:  -Two-vessel disease w/calcifications by CT. No anginal  complaints.  - Continue statin, BB. ASA substituted with anticoagulation as above.     Anemia, chronic - secondary to vitamin B12 deficiency. Acute blood loss anemia, post  procedure -Baseline hemoglobin around 12, post procedure hemoglobin around 9-10 without overt bleeding -B12 low normal, continue to supplement   Insomnia - Melatonin as needed at bedtime.   RN Pressure Injury Documentation: Pressure Injury 03/11/21 Sacrum Mid Stage 3 -  Full thickness tissue loss. Subcutaneous fat may be visible but bone, tendon or muscle are NOT exposed. Wound is 4.5cmX3.5cm     it is 80% red and about 20% white (Active)  03/11/21 2020  Location: Sacrum  Location Orientation: Mid  Staging: Stage 3 -  Full thickness tissue loss. Subcutaneous fat may be visible but bone, tendon or muscle are NOT exposed.  Wound Description (Comments): Wound is 4.5cmX3.5cm     it is 80% red and about 20% white  Present on Admission: Yes    Obesity: Estimated body mass index is 34.69 kg/m as calculated from the following:   Height as of this encounter: 6\' 4"  (1.93 m).   Weight as of this encounter: 129.3 kg.  DVT prophylaxis: Lovenox full dose Code Status: Full Family Communication: None present  Status is: Inpatient  Dispo: The patient is from: Home              Anticipated d/c is to: Home with home health, PT              Anticipated d/c date is: 48 to 72 hours pending clinical course, safe disposition requiring outpatient management including wound VAC, PICC line, IV antibiotics, monitoring as well as follow-up planning with orthopedic surgery in [place].              Patient currently not medically stable for discharge  Consultants:  Orthopedic surgery, plastic surgery  Procedures:  1.  Debridement right knee wound totaling 8 x 8 cm including skin and subcutaneous tissue 2.  Washout of right knee wound 3.  Placement of right knee wound wound VAC totaling 8 x 8 cm  Antimicrobials:  Vancomycin  Subjective: No acute issues or events overnight, pain currently well controlled, wife at bedside, lengthy discussion about plan of care, prognosis, need for specialist evaluation  [name] and interim IV antibiotics via PICC line at home.  Patient and wife are concerned about support/care required at home and continue to require training on PICC line management as well as wound VAC management.  Objective: Vitals:   03/25/21 1927 03/26/21 0524 03/26/21 2047 03/27/21 0522  BP: 101/67 112/82 114/77 118/74  Pulse: 64 64 73 61  Resp: 17 16 17 18   Temp: 98 F (36.7 C) 98 F (36.7 C) 98.2 F (36.8 C) 98.1 F (36.7 C)  TempSrc: Oral  Oral Oral  SpO2: 98% 100% 97% 99%  Weight:      Height:        Intake/Output Summary (Last 24 hours) at 03/27/2021 0720 Last data filed at 03/27/2021 0600 Gross per 24 hour  Intake 800 ml  Output 700 ml  Net 100 ml    Filed Weights   03/10/21 1510 03/24/21 1247  Weight: 129.3 kg 129.3 kg    Examination:  General:  Pleasantly resting in bed, No acute distress. HEENT:  Normocephalic atraumatic.  Sclerae nonicteric, noninjected.  Extraocular movements intact bilaterally. Neck:  Without mass or deformity.  Trachea is midline. Lungs:  Clear to auscultate bilaterally without rhonchi, wheeze, or rales. Heart:  Regular  rate and rhythm.  Without murmurs, rubs, or gallops. Abdomen:  Soft, nontender, nondistended.  Without guarding or rebound. Extremities: Without cyanosis, clubbing, edema, right knee bandage clean dry intact with wound VAC intact, no discernible air leak Vascular:  Dorsalis pedis and posterior tibial pulses palpable bilaterally. Skin:  Warm and dry, no erythema, no ulcerations.   Data Reviewed: I have personally reviewed following labs and imaging studies  CBC: Recent Labs  Lab 03/23/21 0430 03/24/21 0454 03/26/21 0316  WBC 6.0 6.7 6.3  HGB 9.0* 9.6* 9.3*  HCT 29.5* 31.2* 31.2*  MCV 90.8 90.2 89.7  PLT 241 248 032    Basic Metabolic Panel: Recent Labs  Lab 03/23/21 0430 03/24/21 0454 03/26/21 0316  NA 137 142 138  K 3.0* 3.5 3.3*  CL 110 115* 111  CO2 23 22 21*  GLUCOSE 94 96 94  BUN 13 13 15    CREATININE 0.64 0.81 0.62  CALCIUM 8.0* 8.7* 8.3*  MG  --  2.1  --     GFR: Estimated Creatinine Clearance: 127.9 mL/min (by C-G formula based on SCr of 0.62 mg/dL). Liver Function Tests: No results for input(s): AST, ALT, ALKPHOS, BILITOT, PROT, ALBUMIN in the last 168 hours. No results for input(s): LIPASE, AMYLASE in the last 168 hours. No results for input(s): AMMONIA in the last 168 hours. Coagulation Profile: No results for input(s): INR, PROTIME in the last 168 hours. Cardiac Enzymes: No results for input(s): CKTOTAL, CKMB, CKMBINDEX, TROPONINI in the last 168 hours. BNP (last 3 results) No results for input(s): PROBNP in the last 8760 hours. HbA1C: No results for input(s): HGBA1C in the last 72 hours. CBG: No results for input(s): GLUCAP in the last 168 hours. Lipid Profile: No results for input(s): CHOL, HDL, LDLCALC, TRIG, CHOLHDL, LDLDIRECT in the last 72 hours. Thyroid Function Tests: No results for input(s): TSH, T4TOTAL, FREET4, T3FREE, THYROIDAB in the last 72 hours. Anemia Panel: No results for input(s): VITAMINB12, FOLATE, FERRITIN, TIBC, IRON, RETICCTPCT in the last 72 hours. Sepsis Labs: No results for input(s): PROCALCITON, LATICACIDVEN in the last 168 hours.  Recent Results (from the past 240 hour(s))  Surgical PCR screen     Status: None   Collection Time: 03/17/21 10:39 AM   Specimen: Nasal Mucosa; Nasal Swab  Result Value Ref Range Status   MRSA, PCR NEGATIVE NEGATIVE Final   Staphylococcus aureus NEGATIVE NEGATIVE Final    Comment: (NOTE) The Xpert SA Assay (FDA approved for NASAL specimens in patients 6 years of age and older), is one component of a comprehensive surveillance program. It is not intended to diagnose infection nor to guide or monitor treatment. Performed at [facility], [place] [address] [place], [place] [address]   Fungus Culture With Stain     Status: None (Preliminary result)   Collection Time: 03/18/21   3:22 PM   Specimen: PATH Cytology Misc. fluid; Body Fluid  Result Value Ref Range Status   Fungus Stain Final report  Final    Comment: (NOTE) Performed At: [facility] [address] [place], [place] [id number] Rush Farmer MD QX:[id number]    Fungus (Mycology) Culture PENDING  Incomplete   Fungal Source WOUND  Final    Comment: Performed at [facility], [place] [address], [place], [place] [address]  Aerobic/Anaerobic Culture w Gram Stain (surgical/deep wound)     Status: None   Collection Time: 03/18/21  3:22 PM   Specimen: PATH Cytology Misc. fluid; Body Fluid  Result Value Ref  Range Status   Specimen Description   Final    WOUND Performed at [place] [address] [place], [place] [address]    Special Requests   Final    NONE Performed at [facility], [place] [address], [place], [place] [address]    Gram Stain   Final    RARE WBC PRESENT,BOTH PMN AND MONONUCLEAR NO ORGANISMS SEEN    Culture   Final    RARE STAPHYLOCOCCUS EPIDERMIDIS NO ANAEROBES ISOLATED Performed at [facility], [place] [address] [place], [place] [address]    Report Status 03/23/2021 FINAL  Final   Organism ID, Bacteria STAPHYLOCOCCUS EPIDERMIDIS  Final      Susceptibility   Staphylococcus epidermidis - MIC*    CIPROFLOXACIN >=8 RESISTANT Resistant     ERYTHROMYCIN >=8 RESISTANT Resistant     GENTAMICIN 8 INTERMEDIATE Intermediate     OXACILLIN >=4 RESISTANT Resistant     TETRACYCLINE 2 SENSITIVE Sensitive     VANCOMYCIN 2 SENSITIVE Sensitive     TRIMETH/SULFA 80 RESISTANT Resistant     CLINDAMYCIN >=8 RESISTANT Resistant     RIFAMPIN <=0.5 SENSITIVE Sensitive     Inducible Clindamycin NEGATIVE Sensitive     * RARE STAPHYLOCOCCUS EPIDERMIDIS  Fungus Culture Result     Status: None   Collection Time: 03/18/21  3:22 PM  Result Value Ref Range Status   Result 1 Comment  Final    Comment: (NOTE) KOH/Calcofluor  preparation:  no fungus observed. Performed At: [facility] [place], [place] [id number] Rush Farmer MD JO:[id number]   Aerobic/Anaerobic Culture w Gram Stain (surgical/deep wound)     Status: None (Preliminary result)   Collection Time: 03/24/21  2:32 PM   Specimen: PATH Soft tissue  Result Value Ref Range Status   Specimen Description   Final    WOUND KNEE RIGHT Performed at [facility], [place] [address] [place], [place] [address]    Special Requests   Final    NONE Performed at [facility], [place] [address] [place], [place] [address]    Gram Stain   Final    MODERATE WBC PRESENT,BOTH PMN AND MONONUCLEAR NO ORGANISMS SEEN Performed at [facility], [place] [address] [place], [place] [address]    Culture   Final    RARE STAPHYLOCOCCUS HAEMOLYTICUS CULTURE REINCUBATED FOR BETTER GROWTH NO ANAEROBES ISOLATED; CULTURE IN PROGRESS FOR 5 DAYS    Report Status PENDING  Incomplete  Aerobic/Anaerobic Culture w Gram Stain (surgical/deep wound)     Status: None (Preliminary result)   Collection Time: 03/24/21  2:33 PM   Specimen: PATH Soft tissue  Result Value Ref Range Status   Specimen Description   Final    WOUND KNEE RIGHT DEEP Performed at [facility], [place] [address], [place], [place] [address]    Special Requests   Final    NONE Performed at [facility], [place] [address], [place], [place] [address]    Gram Stain   Final    FEW WBC PRESENT,BOTH PMN AND MONONUCLEAR NO ORGANISMS SEEN    Culture   Final    CULTURE REINCUBATED FOR BETTER GROWTH Performed at [facility], [place] [address] [place], [place] [address]    Report Status PENDING  Incomplete      Radiology Studies: [place] EKG SITE RITE  Result Date: 03/26/2021 If Site Rite image not attached, placement could not be  confirmed due to current cardiac rhythm.   Scheduled Meds:  (feeding supplement)  PROSource Plus  30 mL Oral BID BM   atorvastatin  20 mg Oral Q1500   docusate sodium  100 mg Oral BID   enoxaparin (LOVENOX) injection  120 mg Subcutaneous Q12H   metoprolol tartrate  12.5 mg Oral BID   pantoprazole  40 mg Oral Daily   senna  1 tablet Oral BID   vitamin B-12  1,000 mcg Oral Daily   Continuous Infusions:  vancomycin Stopped (03/26/21 1811)     LOS: 14 days   Time spent: 56min  Quetzally Callas C Ivanka Kirshner, DO [facility]  If 7PM-7AM, please contact night-coverage [email or web address]  03/27/2021, 7:20 AM

## 2021-03-27 NOTE — Plan of Care (Signed)
  Problem: Education: Goal: Knowledge of General Education information will improve Description: Including pain rating scale, medication(s)/side effects and non-pharmacologic comfort measures Outcome: Progressing   Problem: Activity: Goal: Risk for activity intolerance will decrease Outcome: Progressing   Problem: Pain Managment: Goal: General experience of comfort will improve Outcome: Progressing   

## 2021-03-28 LAB — VANCOMYCIN, PEAK: Vancomycin Pk: 34 ug/mL (ref 30–40)

## 2021-03-28 LAB — BASIC METABOLIC PANEL
Anion gap: 8 (ref 5–15)
BUN: 12 mg/dL (ref 8–23)
CO2: 23 mmol/L (ref 22–32)
Calcium: 8.3 mg/dL — ABNORMAL LOW (ref 8.9–10.3)
Chloride: 108 mmol/L (ref 98–111)
Creatinine, Ser: 0.68 mg/dL (ref 0.61–1.24)
GFR, Estimated: >60 mL/min (ref >60)
Glucose, Bld: 92 mg/dL (ref 70–99)
Potassium: 3.1 mmol/L — ABNORMAL LOW (ref 3.5–5.1)
Sodium: 139 mmol/L (ref 135–145)

## 2021-03-28 LAB — CBC
HCT: 30.3 % — ABNORMAL LOW (ref 39.0–52.0)
Hemoglobin: 9.1 g/dL — ABNORMAL LOW (ref 13.0–17.0)
MCH: 27.2 pg (ref 26.0–34.0)
MCHC: 30 g/dL (ref 30.0–36.0)
MCV: 90.4 fL (ref 80.0–100.0)
Platelets: 260 10*3/uL (ref 150–400)
RBC: 3.35 MIL/uL — ABNORMAL LOW (ref 4.22–5.81)
RDW: 16.1 % — ABNORMAL HIGH (ref 11.5–15.5)
WBC: 6.7 10*3/uL (ref 4.0–10.5)
nRBC: 0 % (ref 0.0–0.2)

## 2021-03-28 MED ORDER — POTASSIUM CHLORIDE CRYS ER 20 MEQ PO TBCR
40.0000 meq | EXTENDED_RELEASE_TABLET | ORAL | Status: AC
Start: 2021-03-28 — End: 2021-03-28
  Administered 2021-03-28 (×3): 40 meq via ORAL
  Filled 2021-03-28 (×3): qty 2

## 2021-03-28 NOTE — Plan of Care (Signed)
  Problem: Education: Goal: Knowledge of General Education information will improve Description: Including pain rating scale, medication(s)/side effects and non-pharmacologic comfort measures Outcome: Progressing   Problem: Activity: Goal: Risk for activity intolerance will decrease Outcome: Progressing   Problem: Pain Managment: Goal: General experience of comfort will improve Outcome: Progressing   

## 2021-03-28 NOTE — Progress Notes (Signed)
Physical Therapy Treatment Patient Details Name: Harry [name] MRN: [id number] DOB: [birth year] Today's Date: 03/28/2021   History of Present Illness Harry [name] is a 70 y.o. male with a history of HTN, COPD, previous tobacco use, lymphedema of legs, and severe right knee arthritis s/p TKA 01/07/2021 subsequently discharged to SNF and suffered anterior knee hardware dislocation on 8/1 that was successfully reduced in the ED.      Unfortunately this recurred the following day with persistent patella alta on post reduction films at that time. On 8/3 he underwent revision to TKA, reconstruction of extensor mechanism and MCL.      At his immediate postop visit, he was found to have some questionable skin flap viability. He was treated for a total of 6 weeks in a cylinder cast and had wound checks at regular intervals. On 9/14, he was discharged from rehab to home. On follow-up on 9/20, his wound was found to be fully demarcated and was therefore indicated for debridement.  The following day on 03/11/2021 he was brought the hospital for elective irrigation and debridement of right knee -subsequently suffering from hypotension and rapid A. fib with RVR in the setting of anesthetic induction requiring hospitalization and cancellation of the procedure.  Patient able to tolerate procedure on 03/18/2021 for debridement and wound VAC placement.  Plan for repeat evaluation and possible further surgical intervention per orthopedic surgical team and plastics.  Possible need for transition to tertiary care center pending repeat surgical procedure here given complexity and etiology of the wound.    PT Comments    Pt very cooperative but continues to fatigue easily with exertion and requiring cues to pace self and with noted SOB.   Recommendations for follow up therapy are one component of a multi-disciplinary discharge planning process, led by the attending physician.  Recommendations may be updated based on  patient status, additional functional criteria and insurance authorization.  Follow Up Recommendations  Home health PT;Supervision for mobility/OOB     Equipment Recommendations  None recommended by PT    Recommendations for Other Services       Precautions / Restrictions Precautions Precautions: Knee;Fall Precaution Comments: VAC to right LE, post op shoe right LE Required Braces or Orthoses: Knee Immobilizer - Right Knee Immobilizer - Right: On at all times Restrictions Weight Bearing Restrictions: No RLE Weight Bearing: Weight bearing as tolerated Other Position/Activity Restrictions: with KI and Walker     Mobility  Bed Mobility Overal bed mobility: Needs Assistance Bed Mobility: Supine to Sit;Sit to Supine     Supine to sit: Supervision Sit to supine: Min guard   General bed mobility comments: min guard to bring legs up onto high bed    Transfers Overall transfer level: Needs assistance Equipment used: Rolling walker (2 wheeled) Transfers: Sit to/from Stand Sit to Stand: From elevated surface;Min assist         General transfer comment: cues for LE management and use of UEs to self assist  Ambulation/Gait Ambulation/Gait assistance: Min guard Gait Distance (Feet): 54 Feet Assistive device: Rolling walker (2 wheeled) Gait Pattern/deviations: Decreased stride length;Antalgic;Step-to pattern;Step-through pattern;Decreased step length - right;Decreased step length - left;Shuffle;Trunk flexed Gait velocity: decreased   General Gait Details: cues for posture, position from RW, and pacing with noted SOB   Stairs             Wheelchair Mobility    Modified Rankin (Stroke Patients Only)       Balance Overall balance assessment: Needs assistance  Sitting-balance support: Feet supported Sitting balance-Leahy Scale: Good Sitting balance - Comments: seated EOB   Standing balance support: During functional activity;Bilateral upper extremity  supported Standing balance-Leahy Scale: Poor                              Cognition Arousal/Alertness: Awake/alert Behavior During Therapy: WFL for tasks assessed/performed Overall Cognitive Status: Within Functional Limits for tasks assessed                                        Exercises      General Comments        Pertinent Vitals/Pain Pain Assessment: 0-10 Pain Score: 4  Pain Location: R knee Pain Descriptors / Indicators: Aching;Grimacing Pain Intervention(s): Limited activity within patient's tolerance;Monitored during session;Premedicated before session    Home Living                      Prior Function            PT Goals (current goals can now be found in the care plan section) Acute Rehab PT Goals Patient Stated Goal: Regain use of knee PT Goal Formulation: With patient Time For Goal Achievement: 04/02/21 Potential to Achieve Goals: Good Progress towards PT goals: Progressing toward goals    Frequency    Min 5X/week      PT Plan Current plan remains appropriate    Co-evaluation              AM-PAC PT "6 Clicks" Mobility   Outcome Measure  Help needed turning from your back to your side while in a flat bed without using bedrails?: A Little Help needed moving from lying on your back to sitting on the side of a flat bed without using bedrails?: A Little Help needed moving to and from a bed to a chair (including a wheelchair)?: A Little Help needed standing up from a chair using your arms (e.g., wheelchair or bedside chair)?: A Little Help needed to walk in hospital room?: A Little Help needed climbing 3-5 steps with a railing? : A Lot 6 Click Score: 17    End of Session Equipment Utilized During Treatment: Gait belt;Right knee immobilizer Activity Tolerance: Patient tolerated treatment well;Patient limited by fatigue Patient left: in bed;with call bell/phone within reach;with bed alarm set Nurse  Communication: Mobility status PT Visit Diagnosis: Other abnormalities of gait and mobility (R26.89) Pain - Right/Left: Right Pain - part of body: Knee     Time: 7628-3151 PT Time Calculation (min) (ACUTE ONLY): 25 min  Charges:  $Gait Training: 8-22 mins                     [place] Pager [phone] Office [phone]    Shamona Wirtz 03/28/2021, 2:36 PM

## 2021-03-28 NOTE — Plan of Care (Signed)
  Problem: Education: Goal: Knowledge of General Education information will improve Description: Including pain rating scale, medication(s)/side effects and non-pharmacologic comfort measures Outcome: Progressing   Problem: Clinical Measurements: Goal: Cardiovascular complication will be avoided Outcome: Progressing   Problem: Nutrition: Goal: Adequate nutrition will be maintained Outcome: Progressing   Problem: Elimination: Goal: Will not experience complications related to bowel motility Outcome: Progressing Goal: Will not experience complications related to urinary retention Outcome: Progressing   Problem: Pain Managment: Goal: General experience of comfort will improve Outcome: Progressing

## 2021-03-28 NOTE — Progress Notes (Addendum)
PROGRESS NOTE    BETHANY CUMMING  VQM:[id number] DOB: [birth year] DOA: 03/11/2021 PCP: Buzzy Han, MD   Brief Narrative:  Harry [name] is a 70 y.o. male with a history of HTN, COPD, previous tobacco use, lymphedema of legs, and severe right knee arthritis s/p TKA 01/07/2021 subsequently discharged to SNF and suffered anterior knee hardware dislocation on 8/1 that was successfully reduced in the ED. Unfortunately this recurred the following day with persistent patella alta on post reduction films at that time. On 8/3 he underwent revision to TKA, reconstruction of extensor mechanism and MCL.  On 9/14, he was discharged from rehab to home - on 9/20, his wound was found to be fully demarcated requiring further debridement and sent to the hospital the next day for further surgical evaluation - subsequently suffering from hypotension and rapid A. fib with RVR in the setting of anesthetic induction requiring hospitalization and cancellation of the procedure.  Patient able to tolerate procedure on 03/18/2021 for debridement and wound VAC placement.  Given complex wound and difficulty during surgery as outlined below patient is going to follow-up with specialist in [place] per discussion with orthopedic surgery.  Pending PICC line placement IV antibiotic administration and set up in the outpatient setting as well as definitive follow-up at [facility] office will discharge patient home with wound VAC.  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 Wound necrosis complicating healing now s/p I&D, wound vac placement 9/28.  - Continue vancomycin with MRSE growing in cultures. DC'ed ceftriaxone.  - Plastic surgery, Dr. Claudia Desanctis, consulted by orthopedics.  Unsuccessful integra application in OR 93/2 - Wound vac management per orthopedics.  - WBAT RLE with knee immobilizer in place at all  times - Plan for disposition to [facility] for definitive care of knee infection, will continue IV vancomycin via PICC line at home with outpatient follow-up over the next few weeks, will likely need 6 to 8 weeks of antibiotics total but this will depend on further surgical outpatient evaluation and intervention.  Paroxysmal atrial flutter with RVR, resolved:  -Currently rate controlled, normal sinus rhythm - Echo with preserved LVEF, G1DD, mild aortic root dilatation. TSH 1.043.  - Continue metoprolol 25mg  po BID. - Continue therapeutic lovenox (reinitiated 9/29 per ortho). Will likely need to switch to eliquis at DC for CHA2DS2-VASc score of 3 (age, HTN, atherosclerosis).  - Has cardiology follow up arranged 10/14   Hypokalemia:  -Continue to replete as appropriate  Hypotension:  -Transient, in association with anesthetics, resolved  Questionable right atrial mass:  - Not seen on MRI which was unfortunately limited by claustrophobia and was also done without contrast administration. Prominent eustachian valve was noted.  - Follow up per cardiology in the outpatient setting.   Chronic bilateral lower extremity lymphedema:  - Limited venous U/S showed no DVT.  - No PE identified on CTA chest.  COPD, not in acute exacerbation:  Stable. No medications needed at this time. Wish to avoid beta agonists to avoid provocation of atrial dysrhythmia.   HLD:  -Continue statin. LDL is 39.   Nonobstructive CAD:  -Two-vessel disease w/calcifications by CT. No anginal complaints.  - Continue statin, BB. ASA substituted with anticoagulation as above.     Anemia, chronic - secondary to vitamin B12 deficiency. Acute blood loss anemia, post procedure -Baseline hemoglobin around 12, post procedure hemoglobin around 9-10 without overt bleeding -B12 low normal, continue to supplement  Insomnia - Melatonin as needed at bedtime.   RN Pressure Injury Documentation: Pressure Injury 03/11/21 Sacrum  Mid Stage 3 -  Full thickness tissue loss. Subcutaneous fat may be visible but bone, tendon or muscle are NOT exposed. Wound is 4.5cmX3.5cm     it is 80% red and about 20% white (Active)  03/11/21 2020  Location: Sacrum  Location Orientation: Mid  Staging: Stage 3 -  Full thickness tissue loss. Subcutaneous fat may be visible but bone, tendon or muscle are NOT exposed.  Wound Description (Comments): Wound is 4.5cmX3.5cm     it is 80% red and about 20% white  Present on Admission: Yes    Obesity: Estimated body mass index is 34.69 kg/m as calculated from the following:   Height as of this encounter: 6\' 4"  (1.93 m).   Weight as of this encounter: 129.3 kg.  DVT prophylaxis: Lovenox full dose Code Status: Full Family Communication: None present  Status is: Inpatient  Dispo: The patient is from: Home              Anticipated d/c is to: Home with home health, PT              Anticipated d/c date is: 48 to 72 hours pending clinical course, safe disposition requiring outpatient management including wound VAC, PICC line, IV antibiotics, monitoring as well as follow-up planning with orthopedic surgery in [place].              Patient currently not medically stable for discharge  Consultants:  Orthopedic surgery, plastic surgery  Procedures:  1.  Debridement right knee wound totaling 8 x 8 cm including skin and subcutaneous tissue 2.  Washout of right knee wound 3.  Placement of right knee wound wound VAC totaling 8 x 8 cm  Antimicrobials:  Vancomycin  Subjective: No acute issues or events overnight, PICC line placed yesterday, no issues, remains somewhat anxious about discharge without concrete disposition plans. Objective: Vitals:   03/27/21 0522 03/27/21 1358 03/27/21 2126 03/28/21 0457  BP: 118/74 103/61 109/63 131/90  Pulse: 61 71 60 (!) 59  Resp: 18 20 18 18   Temp: 98.1 F (36.7 C) 98 F (36.7 C) 97.8 F (36.6 C) 97.9 F (36.6 C)  TempSrc: Oral  Oral Oral  SpO2: 99% 97%  96% 97%  Weight:      Height:        Intake/Output Summary (Last 24 hours) at 03/28/2021 0747 Last data filed at 03/28/2021 0500 Gross per 24 hour  Intake 594.43 ml  Output 1025 ml  Net -430.57 ml    Filed Weights   03/10/21 1510 03/24/21 1247  Weight: 129.3 kg 129.3 kg    Examination:  General:  Pleasantly resting in bed, No acute distress. HEENT:  Normocephalic atraumatic.  Sclerae nonicteric, noninjected.  Extraocular movements intact bilaterally. Neck:  Without mass or deformity.  Trachea is midline. Lungs:  Clear to auscultate bilaterally without rhonchi, wheeze, or rales. Heart:  Regular rate and rhythm.  Without murmurs, rubs, or gallops. Abdomen:  Soft, nontender, nondistended.  Without guarding or rebound. Extremities: Without cyanosis, clubbing, edema, right knee bandage clean dry intact with wound VAC intact, no discernible air leak Vascular:  Dorsalis pedis and posterior tibial pulses palpable bilaterally. Skin:  Warm and dry, no erythema, no ulcerations.   Data Reviewed: I have personally reviewed following labs and imaging studies  CBC: Recent Labs  Lab 03/23/21 0430 03/24/21 0454 03/26/21 0316  WBC 6.0 6.7 6.3  HGB  9.0* 9.6* 9.3*  HCT 29.5* 31.2* 31.2*  MCV 90.8 90.2 89.7  PLT 241 248 163    Basic Metabolic Panel: Recent Labs  Lab 03/23/21 0430 03/24/21 0454 03/26/21 0316 03/28/21 0529  NA 137 142 138 139  K 3.0* 3.5 3.3* 3.1*  CL 110 115* 111 108  CO2 23 22 21* 23  GLUCOSE 94 96 94 92  BUN 13 13 15 12   CREATININE 0.64 0.81 0.62 0.68  CALCIUM 8.0* 8.7* 8.3* 8.3*  MG  --  2.1  --   --     GFR: Estimated Creatinine Clearance: 127.9 mL/min (by C-G formula based on SCr of 0.68 mg/dL). Liver Function Tests: No results for input(s): AST, ALT, ALKPHOS, BILITOT, PROT, ALBUMIN in the last 168 hours. No results for input(s): LIPASE, AMYLASE in the last 168 hours. No results for input(s): AMMONIA in the last 168 hours. Coagulation Profile: No  results for input(s): INR, PROTIME in the last 168 hours. Cardiac Enzymes: No results for input(s): CKTOTAL, CKMB, CKMBINDEX, TROPONINI in the last 168 hours. BNP (last 3 results) No results for input(s): PROBNP in the last 8760 hours. HbA1C: No results for input(s): HGBA1C in the last 72 hours. CBG: No results for input(s): GLUCAP in the last 168 hours. Lipid Profile: No results for input(s): CHOL, HDL, LDLCALC, TRIG, CHOLHDL, LDLDIRECT in the last 72 hours. Thyroid Function Tests: No results for input(s): TSH, T4TOTAL, FREET4, T3FREE, THYROIDAB in the last 72 hours. Anemia Panel: No results for input(s): VITAMINB12, FOLATE, FERRITIN, TIBC, IRON, RETICCTPCT in the last 72 hours. Sepsis Labs: No results for input(s): PROCALCITON, LATICACIDVEN in the last 168 hours.  Recent Results (from the past 240 hour(s))  Fungus Culture With Stain     Status: None (Preliminary result)   Collection Time: 03/18/21  3:22 PM   Specimen: PATH Cytology Misc. fluid; Body Fluid  Result Value Ref Range Status   Fungus Stain Final report  Final    Comment: (NOTE) Performed At: [facility] [address] [place], [place] [id number] Rush Farmer MD [id number]    Fungus (Mycology) Culture PENDING  Incomplete   Fungal Source WOUND  Final    Comment: Performed at [facility], [place] [address] [place], [place] [address]  Aerobic/Anaerobic Culture w Gram Stain (surgical/deep wound)     Status: None   Collection Time: 03/18/21  3:22 PM   Specimen: PATH Cytology Misc. fluid; Body Fluid  Result Value Ref Range Status   Specimen Description   Final    WOUND Performed at [place] [address], [place], [place] [address]    Special Requests   Final    NONE Performed at [facility], [place] [address] [place], [place] [address]    Gram Stain   Final    RARE WBC PRESENT,BOTH PMN AND MONONUCLEAR NO ORGANISMS SEEN    Culture   Final     RARE STAPHYLOCOCCUS EPIDERMIDIS NO ANAEROBES ISOLATED Performed at [facility], [place] [address], [place], [place] [address]    Report Status 03/23/2021 FINAL  Final   Organism ID, Bacteria STAPHYLOCOCCUS EPIDERMIDIS  Final      Susceptibility   Staphylococcus epidermidis - MIC*    CIPROFLOXACIN >=8 RESISTANT Resistant     ERYTHROMYCIN >=8 RESISTANT Resistant     GENTAMICIN 8 INTERMEDIATE Intermediate     OXACILLIN >=4 RESISTANT Resistant     TETRACYCLINE 2 SENSITIVE Sensitive     VANCOMYCIN 2 SENSITIVE Sensitive  TRIMETH/SULFA 80 RESISTANT Resistant     CLINDAMYCIN >=8 RESISTANT Resistant     RIFAMPIN <=0.5 SENSITIVE Sensitive     Inducible Clindamycin NEGATIVE Sensitive     * RARE STAPHYLOCOCCUS EPIDERMIDIS  Fungus Culture Result     Status: None   Collection Time: 03/18/21  3:22 PM  Result Value Ref Range Status   Result 1 Comment  Final    Comment: (NOTE) KOH/Calcofluor preparation:  no fungus observed. Performed At: [facility] [place], [place] [id number] Rush Farmer MD [id number]   Aerobic/Anaerobic Culture w Gram Stain (surgical/deep wound)     Status: None (Preliminary result)   Collection Time: 03/24/21  2:32 PM   Specimen: PATH Soft tissue  Result Value Ref Range Status   Specimen Description   Final    WOUND KNEE RIGHT Performed at [facility], [place] [address], [place], [place] [address]    Special Requests   Final    NONE Performed at [facility], [place] [address], [place], [place] [address]    Gram Stain   Final    MODERATE WBC PRESENT,BOTH PMN AND MONONUCLEAR NO ORGANISMS SEEN Performed at [facility], [place] [address], [place], [place] [address]    Culture   Final    RARE STAPHYLOCOCCUS HAEMOLYTICUS SUSCEPTIBILITIES TO FOLLOW NO ANAEROBES ISOLATED; CULTURE IN PROGRESS FOR 5 DAYS    Report Status PENDING  Incomplete  Aerobic/Anaerobic Culture w Gram Stain  (surgical/deep wound)     Status: None (Preliminary result)   Collection Time: 03/24/21  2:33 PM   Specimen: PATH Soft tissue  Result Value Ref Range Status   Specimen Description   Final    WOUND KNEE RIGHT DEEP Performed at [facility], [place] [address], [place], [place] [address]    Special Requests   Final    NONE Performed at [facility], [place] [address], [place], [place] [address]    Gram Stain   Final    FEW WBC PRESENT,BOTH PMN AND MONONUCLEAR NO ORGANISMS SEEN Performed at [facility], [place] [address] [place], [place] [address]    Culture   Final    RARE STAPHYLOCOCCUS EPIDERMIDIS SUSCEPTIBILITIES TO FOLLOW NO ANAEROBES ISOLATED; CULTURE IN PROGRESS FOR 5 DAYS    Report Status PENDING  Incomplete      Radiology Studies: [place] EKG SITE RITE  Result Date: 03/26/2021 If Site Rite image not attached, placement could not be confirmed due to current cardiac rhythm.   Scheduled Meds:  (feeding supplement) PROSource Plus  30 mL Oral BID BM   atorvastatin  20 mg Oral Q1500   Chlorhexidine Gluconate Cloth  6 each Topical Daily   docusate sodium  100 mg Oral BID   enoxaparin (LOVENOX) injection  120 mg Subcutaneous Q12H   metoprolol tartrate  12.5 mg Oral BID   pantoprazole  40 mg Oral Daily   senna  1 tablet Oral BID   sodium chloride flush  10-40 mL Intracatheter Q12H   vitamin B-12  1,000 mcg Oral Daily   Continuous Infusions:  vancomycin 1,500 mg (03/27/21 1704)     LOS: 15 days   Time spent: 31min  Lemond Griffee C Aleria Maheu, DO [facility]  If 7PM-7AM, please contact night-coverage [email or web address]  03/28/2021, 7:47 AM

## 2021-03-28 NOTE — Plan of Care (Signed)
  Problem: Education: Goal: Knowledge of General Education information will improve Description: Including pain rating scale, medication(s)/side effects and non-pharmacologic comfort measures Outcome: Progressing   Problem: Nutrition: Goal: Adequate nutrition will be maintained Outcome: Progressing   Problem: Coping: Goal: Level of anxiety will decrease Outcome: Progressing   Problem: Elimination: Goal: Will not experience complications related to urinary retention Outcome: Progressing   Problem: Pain Managment: Goal: General experience of comfort will improve Outcome: Progressing   

## 2021-03-29 LAB — AEROBIC/ANAEROBIC CULTURE W GRAM STAIN (SURGICAL/DEEP WOUND)

## 2021-03-29 LAB — VANCOMYCIN, RANDOM: Vancomycin Rm: 18

## 2021-03-29 MED ORDER — VANCOMYCIN IV (FOR PTA / DISCHARGE USE ONLY): 1000.0000 mg | Freq: Two times a day (BID) | INTRAVENOUS | 0 refills | Status: DC

## 2021-03-29 MED ORDER — VANCOMYCIN HCL IN DEXTROSE 1-5 GM/200ML-% IV SOLN
1000.0000 mg | Freq: Two times a day (BID) | INTRAVENOUS | Status: DC
  Administered 2021-03-29 – 2021-03-31 (×5): 1000 mg via INTRAVENOUS
  Filled 2021-03-29 (×5): qty 200

## 2021-03-29 NOTE — Progress Notes (Signed)
ANTICOAGULATION CONSULT NOTE - Follow Up Consult  Pharmacy Consult for Lovenox Indication: atrial fibrillation  No Known Allergies  Patient Measurements: Height: 6\' 4"  (193 cm) Weight: 129.3 kg (285 lb 0.9 oz) IBW/kg (Calculated) : 86.8  Vital Signs: Temp: 97.8 F (36.6 C) (10/09 0447) Temp Source: Oral (10/09 0447) BP: 113/Harry (10/09 0447) Pulse Rate: 71 (10/09 0447)  Labs: Recent Labs    03/28/21 0529  HGB 9.1*  HCT 30.3*  PLT 260  CREATININE 0.68     Estimated Creatinine Clearance: 127.9 mL/min (by C-G formula based on SCr of 0.68 mg/dL).   Assessment: 70 yo [name] started on full-dose Lovenox for new onset afib with RVR following induction of anesthesia for elective I&D .  Today, 03/29/2021 CBC stable 10/8 SCr stable, CrCl > 100 ml/min No bleeding documented  Goal of Therapy:  Anti-Xa level 0.6-1 units/ml 4hrs after LMWH dose given Monitor platelets by anticoagulation protocol: Yes   Plan:  Continue Lovenox 120mg  SQ q12h  Monitor CBC, sign/symptoms of bleeding Follow up plans for anticoagulation around surgery and plans for DOAC prior to discharge.   [name]  03/29/2021 10:29 AM

## 2021-03-29 NOTE — Plan of Care (Signed)
  Problem: Education: Goal: Knowledge of General Education information will improve Description: Including pain rating scale, medication(s)/side effects and non-pharmacologic comfort measures Outcome: Progressing   Problem: Activity: Goal: Risk for activity intolerance will decrease Outcome: Progressing   Problem: Pain Managment: Goal: General experience of comfort will improve Outcome: Progressing   

## 2021-03-29 NOTE — Progress Notes (Signed)
PROGRESS NOTE    Harry [name]  WHQ:[id number] DOB: [birth year] DOA: 03/11/2021 PCP: Buzzy Han, MD   Brief Narrative:  Harry [name] is a 70 y.o. male with a history of HTN, COPD, previous tobacco use, lymphedema of legs, and severe right knee arthritis s/p TKA 01/07/2021 subsequently discharged to SNF and suffered anterior knee hardware dislocation on 8/1 that was successfully reduced in the ED. Unfortunately this recurred the following day with persistent patella alta on post reduction films at that time. On 8/3 he underwent revision to TKA, reconstruction of extensor mechanism and MCL.  On 9/14, he was discharged from rehab to home - on 9/20, his wound was found to be fully demarcated requiring further debridement and sent to the hospital the next day for further surgical evaluation - subsequently suffering from hypotension and rapid A. fib with RVR in the setting of anesthetic induction requiring hospitalization and cancellation of the procedure.  Patient able to tolerate procedure on 03/18/2021 for debridement and wound VAC placement.  Given complex wound and difficulty during surgery as outlined below patient is going to follow-up with specialist in [place] per discussion with orthopedic surgery.  Pending PICC line placement IV antibiotic administration and set up in the outpatient setting as well as definitive follow-up at [facility] office will discharge patient home with wound VAC.  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 Wound necrosis complicating healing now s/p I&D, wound vac placement 9/28.  - Continue vancomycin with MRSE growing in cultures. DC'ed ceftriaxone.  - Plastic surgery, Dr. Claudia Desanctis, consulted by orthopedics.  Unsuccessful integra application in OR 65/9 - Wound vac management per orthopedics.  - WBAT RLE with knee immobilizer in place at all  times - Plan for disposition to [facility] for definitive care of knee infection, will continue IV vancomycin via PICC line at home with outpatient follow-up over the next few weeks, will likely need 6 to 8 weeks of antibiotics total but this will depend on further surgical outpatient evaluation and intervention.  Paroxysmal atrial flutter with RVR, resolved:  -Currently rate controlled, normal sinus rhythm - Echo with preserved LVEF, G1DD, mild aortic root dilatation. TSH 1.043.  - Continue metoprolol 25mg  po BID. - Continue therapeutic lovenox (reinitiated 9/29 per ortho). Will likely need to switch to eliquis at DC for CHA2DS2-VASc score of 3 (age, HTN, atherosclerosis).  - Has cardiology follow up arranged 10/14   Hypokalemia:  -Continue to replete as appropriate  Hypotension:  -Transient, in association with anesthetics, resolved  Questionable right atrial mass:  - Not seen on MRI which was unfortunately limited by claustrophobia and was also done without contrast administration. Prominent eustachian valve was noted.  - Follow up per cardiology in the outpatient setting.   Chronic bilateral lower extremity lymphedema:  - Limited venous U/S showed no DVT.  - No PE identified on CTA chest.  COPD, not in acute exacerbation:  Stable. No medications needed at this time. Wish to avoid beta agonists to avoid provocation of atrial dysrhythmia.   HLD:  -Continue statin. LDL is 39.   Nonobstructive CAD:  -Two-vessel disease w/calcifications by CT. No anginal complaints.  - Continue statin, BB. ASA substituted with anticoagulation as above.     Anemia, chronic - secondary to vitamin B12 deficiency. Acute blood loss anemia, post procedure -Baseline hemoglobin around 12, post procedure hemoglobin around 9-10 without overt bleeding -B12 low normal, continue to supplement  Insomnia - Melatonin as needed at bedtime.   RN Pressure Injury Documentation: Pressure Injury 03/11/21 Sacrum  Mid Stage 3 -  Full thickness tissue loss. Subcutaneous fat may be visible but bone, tendon or muscle are NOT exposed. Wound is 4.5cmX3.5cm     it is 80% red and about 20% white (Active)  03/11/21 2020  Location: Sacrum  Location Orientation: Mid  Staging: Stage 3 -  Full thickness tissue loss. Subcutaneous fat may be visible but bone, tendon or muscle are NOT exposed.  Wound Description (Comments): Wound is 4.5cmX3.5cm     it is 80% red and about 20% white  Present on Admission: Yes    Obesity: Estimated body mass index is 34.69 kg/m as calculated from the following:   Height as of this encounter: 6\' 4"  (1.93 m).   Weight as of this encounter: 129.3 kg.  DVT prophylaxis: Lovenox full dose Code Status: Full Family Communication: None present  Status is: Inpatient  Dispo: The patient is from: Home              Anticipated d/c is to: Home with home health, PT              Anticipated d/c date is: 24-48 hours pending safe disposition requiring outpatient management including wound VAC, PICC line, IV antibiotics, monitoring as well as follow-up planning with orthopedic surgery in [place].              Patient currently not medically stable for discharge  Consultants:  Orthopedic surgery, plastic surgery  Procedures:  1.  Debridement right knee wound totaling 8 x 8 cm including skin and subcutaneous tissue 2.  Washout of right knee wound 3.  Placement of right knee wound wound VAC totaling 8 x 8 cm  Antimicrobials:  Vancomycin  Subjective: No acute issues or events overnight, currently awaiting further details about follow-up at discharge with facility and specialist in [place].  Objective: Vitals:   03/28/21 0457 03/28/21 1253 03/28/21 2013 03/29/21 0447  BP: 131/90 117/72 104/68 (!) 113/54  Pulse: (!) 59 70 67 71  Resp: 18 18 18 16   Temp: 97.9 F (36.6 C) 98 F (36.7 C) 97.8 F (36.6 C) 97.8 F (36.6 C)  TempSrc: Oral Oral Oral Oral  SpO2: 97% 98% 98% 98%  Weight:       Height:        Intake/Output Summary (Last 24 hours) at 03/29/2021 0720 Last data filed at 03/29/2021 0447 Gross per 24 hour  Intake 600 ml  Output 500 ml  Net 100 ml    Filed Weights   03/10/21 1510 03/24/21 1247  Weight: 129.3 kg 129.3 kg    Examination:  General:  Pleasantly resting in bed, No acute distress. HEENT:  Normocephalic atraumatic.  Sclerae nonicteric, noninjected.  Extraocular movements intact bilaterally. Neck:  Without mass or deformity.  Trachea is midline. Lungs:  Clear to auscultate bilaterally without rhonchi, wheeze, or rales. Heart:  Regular rate and rhythm.  Without murmurs, rubs, or gallops. Abdomen:  Soft, nontender, nondistended.  Without guarding or rebound. Extremities: Without cyanosis, clubbing, edema, right knee bandage clean dry intact with wound VAC intact, no discernible air leak Vascular:  Dorsalis pedis and posterior tibial pulses palpable bilaterally. Skin:  Warm and dry, no erythema, no ulcerations.   Data Reviewed: I have personally reviewed following labs and imaging studies  CBC: Recent Labs  Lab 03/23/21 0430 03/24/21 0454 03/26/21 0316 03/28/21 0529  WBC 6.0 6.7 6.3 6.7  HGB  9.0* 9.6* 9.3* 9.1*  HCT 29.5* 31.2* 31.2* 30.3*  MCV 90.8 90.2 89.7 90.4  PLT 241 248 247 767    Basic Metabolic Panel: Recent Labs  Lab 03/23/21 0430 03/24/21 0454 03/26/21 0316 03/28/21 0529  NA 137 142 138 139  K 3.0* 3.5 3.3* 3.1*  CL 110 115* 111 108  CO2 23 22 21* 23  GLUCOSE 94 96 94 92  BUN 13 13 15 12   CREATININE 0.64 0.81 0.62 0.68  CALCIUM 8.0* 8.7* 8.3* 8.3*  MG  --  2.1  --   --     GFR: Estimated Creatinine Clearance: 127.9 mL/min (by C-G formula based on SCr of 0.68 mg/dL). Liver Function Tests: No results for input(s): AST, ALT, ALKPHOS, BILITOT, PROT, ALBUMIN in the last 168 hours. No results for input(s): LIPASE, AMYLASE in the last 168 hours. No results for input(s): AMMONIA in the last 168 hours. Coagulation  Profile: No results for input(s): INR, PROTIME in the last 168 hours. Cardiac Enzymes: No results for input(s): CKTOTAL, CKMB, CKMBINDEX, TROPONINI in the last 168 hours. BNP (last 3 results) No results for input(s): PROBNP in the last 8760 hours. HbA1C: No results for input(s): HGBA1C in the last 72 hours. CBG: No results for input(s): GLUCAP in the last 168 hours. Lipid Profile: No results for input(s): CHOL, HDL, LDLCALC, TRIG, CHOLHDL, LDLDIRECT in the last 72 hours. Thyroid Function Tests: No results for input(s): TSH, T4TOTAL, FREET4, T3FREE, THYROIDAB in the last 72 hours. Anemia Panel: No results for input(s): VITAMINB12, FOLATE, FERRITIN, TIBC, IRON, RETICCTPCT in the last 72 hours. Sepsis Labs: No results for input(s): PROCALCITON, LATICACIDVEN in the last 168 hours.  Recent Results (from the past 240 hour(s))  Aerobic/Anaerobic Culture w Gram Stain (surgical/deep wound)     Status: None (Preliminary result)   Collection Time: 03/24/21  2:32 PM   Specimen: PATH Soft tissue  Result Value Ref Range Status   Specimen Description   Final    WOUND KNEE RIGHT Performed at [facility] [address] [place], [place] [address]    Special Requests   Final    NONE Performed at [facility], [place] [address] [place], [place] [address]    Gram Stain   Final    MODERATE WBC PRESENT,BOTH PMN AND MONONUCLEAR NO ORGANISMS SEEN Performed at [facility], [place] [address], [place], [place] [address]    Culture   Final    RARE STAPHYLOCOCCUS HAEMOLYTICUS NO ANAEROBES ISOLATED; CULTURE IN PROGRESS FOR 5 DAYS    Report Status PENDING  Incomplete   Organism ID, Bacteria STAPHYLOCOCCUS HAEMOLYTICUS  Final      Susceptibility   Staphylococcus haemolyticus - MIC*    CIPROFLOXACIN >=8 RESISTANT Resistant     ERYTHROMYCIN >=8 RESISTANT Resistant     GENTAMICIN >=16 RESISTANT Resistant     OXACILLIN >=4 RESISTANT Resistant     TETRACYCLINE 2  SENSITIVE Sensitive     VANCOMYCIN 1 SENSITIVE Sensitive     TRIMETH/SULFA 160 RESISTANT Resistant     CLINDAMYCIN >=8 RESISTANT Resistant     RIFAMPIN >=32 RESISTANT Resistant     Inducible Clindamycin NEGATIVE Sensitive     * RARE STAPHYLOCOCCUS HAEMOLYTICUS  Aerobic/Anaerobic Culture w Gram Stain (surgical/deep wound)     Status: None (Preliminary result)   Collection Time: 03/24/21  2:33 PM   Specimen: PATH Soft tissue  Result Value Ref Range Status   Specimen Description   Final    WOUND  KNEE RIGHT DEEP Performed at [facility] [address] [place], [place] [address]    Special Requests   Final    NONE Performed at [facility], [place] [address], [place], [place] [address]    Gram Stain   Final    FEW WBC PRESENT,BOTH PMN AND MONONUCLEAR NO ORGANISMS SEEN Performed at [facility], [place] [address] [place], [place] [address]    Culture   Final    RARE STAPHYLOCOCCUS EPIDERMIDIS NO ANAEROBES ISOLATED; CULTURE IN PROGRESS FOR 5 DAYS    Report Status PENDING  Incomplete   Organism ID, Bacteria STAPHYLOCOCCUS EPIDERMIDIS  Final      Susceptibility   Staphylococcus epidermidis - MIC*    CIPROFLOXACIN >=8 RESISTANT Resistant     ERYTHROMYCIN >=8 RESISTANT Resistant     GENTAMICIN >=16 RESISTANT Resistant     OXACILLIN >=4 RESISTANT Resistant     TETRACYCLINE 2 SENSITIVE Sensitive     VANCOMYCIN 2 SENSITIVE Sensitive     TRIMETH/SULFA 160 RESISTANT Resistant     CLINDAMYCIN >=8 RESISTANT Resistant     RIFAMPIN <=0.5 SENSITIVE Sensitive     Inducible Clindamycin NEGATIVE Sensitive     * RARE STAPHYLOCOCCUS EPIDERMIDIS      Radiology Studies: No results found.  Scheduled Meds:  (feeding supplement) PROSource Plus  30 mL Oral BID BM   atorvastatin  20 mg Oral Q1500   Chlorhexidine Gluconate Cloth  6 each Topical Daily   docusate sodium  100 mg Oral BID   enoxaparin (LOVENOX) injection  120 mg Subcutaneous Q12H    metoprolol tartrate  12.5 mg Oral BID   pantoprazole  40 mg Oral Daily   senna  1 tablet Oral BID   sodium chloride flush  10-40 mL Intracatheter Q12H   vitamin B-12  1,000 mcg Oral Daily   Continuous Infusions:  vancomycin 1,500 mg (03/28/21 1759)     LOS: 16 days   Time spent: 66min  Sanjith Siwek C Mckinna Demars, DO [facility]  If 7PM-7AM, please contact night-coverage [email or web address]  03/29/2021, 7:20 AM

## 2021-03-29 NOTE — Progress Notes (Signed)
Physical Therapy Treatment Patient Details Name: Harry [name] MRN: [id number] DOB: 1950-10-26 Today's Date: 03/29/2021   History of Present Illness Harry [name] is a 70 y.o. male with a history of HTN, COPD, previous tobacco use, lymphedema of legs, and severe right knee arthritis s/p TKA 01/07/2021 subsequently discharged to SNF and suffered anterior knee hardware dislocation on 8/1 that was successfully reduced in the ED.      Unfortunately this recurred the following day with persistent patella alta on post reduction films at that time. On 8/3 he underwent revision to TKA, reconstruction of extensor mechanism and MCL.      At his immediate postop visit, he was found to have some questionable skin flap viability. He was treated for a total of 6 weeks in a cylinder cast and had wound checks at regular intervals. On 9/14, he was discharged from rehab to home. On follow-up on 9/20, his wound was found to be fully demarcated and was therefore indicated for debridement.  The following day on 03/11/2021 he was brought the hospital for elective irrigation and debridement of right knee -subsequently suffering from hypotension and rapid A. fib with RVR in the setting of anesthetic induction requiring hospitalization and cancellation of the procedure.  Patient able to tolerate procedure on 03/18/2021 for debridement and wound VAC placement.  Plan for repeat evaluation and possible further surgical intervention per orthopedic surgical team and plastics.  Possible need for transition to tertiary care center pending repeat surgical procedure here given complexity and etiology of the wound.    PT Comments    Pt very cooperative and with marked improvement in activity tolerance and ambulatory stability vs session yesterday.   Recommendations for follow up therapy are one component of a multi-disciplinary discharge planning process, led by the attending physician.  Recommendations may be updated based on patient  status, additional functional criteria and insurance authorization.  Follow Up Recommendations  Home health PT;Supervision for mobility/OOB     Equipment Recommendations  None recommended by PT    Recommendations for Other Services       Precautions / Restrictions Precautions Precautions: Knee;Fall Precaution Comments: VAC to right LE, post op shoe right LE Required Braces or Orthoses: Knee Immobilizer - Right Knee Immobilizer - Right: On at all times Restrictions Weight Bearing Restrictions: No RLE Weight Bearing: Weight bearing as tolerated Other Position/Activity Restrictions: with KI and Walker     Mobility  Bed Mobility Overal bed mobility: Needs Assistance Bed Mobility: Supine to Sit;Sit to Supine     Supine to sit: Modified independent (Device/Increase time) Sit to supine: Min guard   General bed mobility comments: min guard to bring legs up onto high bed    Transfers Overall transfer level: Needs assistance Equipment used: Rolling walker (2 wheeled) Transfers: Sit to/from Stand Sit to Stand: From elevated surface;Min guard         General transfer comment: Pt using both UEs on RW to stand from elevated bed  Ambulation/Gait Ambulation/Gait assistance: Min guard Gait Distance (Feet): 108 Feet Assistive device: Rolling walker (2 wheeled) Gait Pattern/deviations: Decreased stride length;Antalgic;Step-to pattern;Step-through pattern;Decreased step length - right;Decreased step length - left;Shuffle;Trunk flexed Gait velocity: decreased   General Gait Details: min cues for posture, position from RW, and pacing with noted SOB   Stairs             Wheelchair Mobility    Modified Rankin (Stroke Patients Only)       Balance Overall balance assessment: Needs assistance  Sitting-balance support: Feet supported Sitting balance-Leahy Scale: Good Sitting balance - Comments: seated EOB   Standing balance support: During functional  activity;Bilateral upper extremity supported Standing balance-Leahy Scale: Poor Standing balance comment: reliant on UE support, fearful of falling                            Cognition Arousal/Alertness: Awake/alert Behavior During Therapy: WFL for tasks assessed/performed Overall Cognitive Status: Within Functional Limits for tasks assessed                                        Exercises      General Comments        Pertinent Vitals/Pain Pain Assessment: 0-10 Pain Score: 3  Pain Location: R knee Pain Descriptors / Indicators: Aching Pain Intervention(s): Limited activity within patient's tolerance;Monitored during session;Premedicated before session    Home Living                      Prior Function            PT Goals (current goals can now be found in the care plan section) Acute Rehab PT Goals Patient Stated Goal: Regain use of knee PT Goal Formulation: With patient Time For Goal Achievement: 04/02/21 Potential to Achieve Goals: Good Progress towards PT goals: Progressing toward goals    Frequency    Min 5X/week      PT Plan Current plan remains appropriate    Co-evaluation              AM-PAC PT "6 Clicks" Mobility   Outcome Measure  Help needed turning from your back to your side while in a flat bed without using bedrails?: A Little Help needed moving from lying on your back to sitting on the side of a flat bed without using bedrails?: A Little Help needed moving to and from a bed to a chair (including a wheelchair)?: A Little Help needed standing up from a chair using your arms (e.g., wheelchair or bedside chair)?: A Little Help needed to walk in hospital room?: A Little Help needed climbing 3-5 steps with a railing? : A Lot 6 Click Score: 17    End of Session Equipment Utilized During Treatment: Gait belt;Right knee immobilizer Activity Tolerance: Patient tolerated treatment well Patient left: in  bed;with call bell/phone within reach;with bed alarm set Nurse Communication: Mobility status PT Visit Diagnosis: Other abnormalities of gait and mobility (R26.89) Pain - Right/Left: Right Pain - part of body: Knee     Time: 1201-1222 PT Time Calculation (min) (ACUTE ONLY): 21 min  Charges:  $Gait Training: 8-22 mins                     [facility] Pager [phone] Office [phone]    Aaniyah Strohm 03/29/2021, 4:18 PM

## 2021-03-29 NOTE — Plan of Care (Signed)

## 2021-03-29 NOTE — Progress Notes (Signed)
Pharmacy Antibiotic Note  Harry [name] is a 70 y.o. male admitted on 03/11/2021 with  wound infection .  Pharmacy has been consulted for vancomycin dosing.  Pt underwent I&D with placement of wound vac on 9/28. Currently on CTX, antibiotic coverage being broadened with the addition of vancomycin. Now s/p debridement, washout, and replace wound vac by plastic surgery.   03/22/21 Vancomycin trough= 20, dose of 1250mg  q12h continued  03/25/21 SCr remains stable as of yesterday, recheck trough = 21 (goal 15-20)  03/26/21 Changed to 1500 mg q 24 hours in anticipation of accumulation with long-term therapy  03/29/21 Vanc pk= 34, vanc rm = 18, vanc AUC = 390  Plan: Vancomycin levels with 3rd dose of 1500 mg q 24 hours are sub-therapeutic Will change vancomycin dosing to 1000 mg iv q 12 hours to target predicted AUC 519, Cmin 13 Edited OPAT orders Defer to ID RPh to empirically reduce dose in anticipation of accumulation SCr in AM Follow renal function  Height: 6\' 4"  (193 cm) Weight: 129.3 kg (285 lb 0.9 oz) IBW/kg (Calculated) : 86.8  Temp (24hrs), Avg:97.9 F (36.6 C), Min:97.8 F (36.6 C), Max:98 F (36.7 C)  Recent Labs  Lab 03/22/21 1703 03/23/21 0430 03/24/21 0454 03/25/21 1736 03/26/21 0316 03/28/21 0529 03/28/21 2100 03/29/21 0345  WBC  --  6.0 6.7  --  6.3 6.7  --   --   CREATININE  --  0.64 0.81  --  0.62 0.68  --   --   VANCOTROUGH 20  --   --  21*  --   --   --   --   VANCOPEAK  --   --   --   --   --   --  34  --   VANCORANDOM  --   --   --   --   --   --   --  18     Estimated Creatinine Clearance: 127.9 mL/min (by C-G formula based on SCr of 0.68 mg/dL).    Recent Results (from the past 240 hour(s))  Aerobic/Anaerobic Culture w Gram Stain (surgical/deep wound)     Status: None (Preliminary result)   Collection Time: 03/24/21  2:32 PM   Specimen: PATH Soft tissue  Result Value Ref Range Status   Specimen Description   Final    WOUND KNEE  RIGHT Performed at [facility] [address] [place], [place] [address]    Special Requests   Final    NONE Performed at [facility], [place] [address], [place], [place] [address]    Gram Stain   Final    MODERATE WBC PRESENT,BOTH PMN AND MONONUCLEAR NO ORGANISMS SEEN Performed at [facility], [place] [address] [place], [place] [address]    Culture   Final    RARE STAPHYLOCOCCUS HAEMOLYTICUS NO ANAEROBES ISOLATED; CULTURE IN PROGRESS FOR 5 DAYS    Report Status PENDING  Incomplete   Organism ID, Bacteria STAPHYLOCOCCUS HAEMOLYTICUS  Final      Susceptibility   Staphylococcus haemolyticus - MIC*    CIPROFLOXACIN >=8 RESISTANT Resistant     ERYTHROMYCIN >=8 RESISTANT Resistant     GENTAMICIN >=16 RESISTANT Resistant     OXACILLIN >=4 RESISTANT Resistant     TETRACYCLINE 2 SENSITIVE Sensitive     VANCOMYCIN 1 SENSITIVE Sensitive     TRIMETH/SULFA 160 RESISTANT Resistant     CLINDAMYCIN >=8 RESISTANT Resistant     RIFAMPIN >=32 RESISTANT Resistant  Inducible Clindamycin NEGATIVE Sensitive     * RARE STAPHYLOCOCCUS HAEMOLYTICUS  Aerobic/Anaerobic Culture w Gram Stain (surgical/deep wound)     Status: None (Preliminary result)   Collection Time: 03/24/21  2:33 PM   Specimen: PATH Soft tissue  Result Value Ref Range Status   Specimen Description   Final    WOUND KNEE RIGHT DEEP Performed at [facility] [address], [place], [place] [address]    Special Requests   Final    NONE Performed at [facility], [place] [address] [place], [place] [address]    Gram Stain   Final    FEW WBC PRESENT,BOTH PMN AND MONONUCLEAR NO ORGANISMS SEEN Performed at [facility], [place] [address] [place], [place] [address]    Culture   Final    RARE STAPHYLOCOCCUS EPIDERMIDIS NO ANAEROBES ISOLATED; CULTURE IN PROGRESS FOR 5 DAYS    Report Status PENDING  Incomplete   Organism ID, Bacteria  STAPHYLOCOCCUS EPIDERMIDIS  Final      Susceptibility   Staphylococcus epidermidis - MIC*    CIPROFLOXACIN >=8 RESISTANT Resistant     ERYTHROMYCIN >=8 RESISTANT Resistant     GENTAMICIN >=16 RESISTANT Resistant     OXACILLIN >=4 RESISTANT Resistant     TETRACYCLINE 2 SENSITIVE Sensitive     VANCOMYCIN 2 SENSITIVE Sensitive     TRIMETH/SULFA 160 RESISTANT Resistant     CLINDAMYCIN >=8 RESISTANT Resistant     RIFAMPIN <=0.5 SENSITIVE Sensitive     Inducible Clindamycin NEGATIVE Sensitive     * RARE STAPHYLOCOCCUS EPIDERMIDIS     No Known Allergies  Thank you for allowing pharmacy to be a part of this patient's care.  [name]  03/29/2021 10:18 AM

## 2021-03-29 NOTE — Progress Notes (Signed)
Patient has unfortunate situation: soft tissue defect, disrupted extensor mechanism, and staph epi PJI. Needs free flap which cannot be done locally. Last week, I tried unsuccessfully to get patient into [facility]. I have spoken with a colleague at [facility] in [place], who agrees to treat the patient. The plan is for the patient to see Dr. Rocky Link as an outpatient next week. He will coordinate surgery with his plastic surgeon. Patient will need spacer and flap, followed by arthrodesis. The plan for now is to continue wound VAC, IV abx via PICC line, WBAT in knee immobilizer. D/C when medically ready.

## 2021-03-30 LAB — CBC
HCT: 31.8 % — ABNORMAL LOW (ref 39.0–52.0)
Hemoglobin: 9.5 g/dL — ABNORMAL LOW (ref 13.0–17.0)
MCH: 26.8 pg (ref 26.0–34.0)
MCHC: 29.9 g/dL — ABNORMAL LOW (ref 30.0–36.0)
MCV: 89.6 fL (ref 80.0–100.0)
Platelets: 267 10*3/uL (ref 150–400)
RBC: 3.55 MIL/uL — ABNORMAL LOW (ref 4.22–5.81)
RDW: 16.2 % — ABNORMAL HIGH (ref 11.5–15.5)
WBC: 6.1 10*3/uL (ref 4.0–10.5)
nRBC: 0 % (ref 0.0–0.2)

## 2021-03-30 LAB — BASIC METABOLIC PANEL
Anion gap: 7 (ref 5–15)
BUN: 12 mg/dL (ref 8–23)
CO2: 23 mmol/L (ref 22–32)
Calcium: 8.8 mg/dL — ABNORMAL LOW (ref 8.9–10.3)
Chloride: 116 mmol/L — ABNORMAL HIGH (ref 98–111)
Creatinine, Ser: 0.75 mg/dL (ref 0.61–1.24)
GFR, Estimated: >60 mL/min (ref >60)
Glucose, Bld: 93 mg/dL (ref 70–99)
Potassium: 3.4 mmol/L — ABNORMAL LOW (ref 3.5–5.1)
Sodium: 146 mmol/L — ABNORMAL HIGH (ref 135–145)

## 2021-03-30 NOTE — Progress Notes (Signed)
Physical Therapy Treatment Patient Details Name: Harry [name] MRN: [id number] DOB: 1950-12-07 Today's Date: 03/30/2021   History of Present Illness TAHJAI [name] is a 70 y.o. male with a history of HTN, COPD, previous tobacco use, lymphedema of legs, and severe right knee arthritis s/p TKA 01/07/2021 subsequently discharged to SNF and suffered anterior knee hardware dislocation on 8/1 that was successfully reduced in the ED.      Unfortunately this recurred the following day with persistent patella alta on post reduction films at that time. On 8/3 he underwent revision to TKA, reconstruction of extensor mechanism and MCL.      At his immediate postop visit, he was found to have some questionable skin flap viability. He was treated for a total of 6 weeks in a cylinder cast and had wound checks at regular intervals. On 9/14, he was discharged from rehab to home. On follow-up on 9/20, his wound was found to be fully demarcated and was therefore indicated for debridement.  The following day on 03/11/2021 he was brought the hospital for elective irrigation and debridement of right knee -subsequently suffering from hypotension and rapid A. fib with RVR in the setting of anesthetic induction requiring hospitalization and cancellation of the procedure.  Patient able to tolerate procedure on 03/18/2021 for debridement and wound VAC placement.  Plan for repeat evaluation and possible further surgical intervention per orthopedic surgical team and plastics.  Possible need for transition to tertiary care center pending repeat surgical procedure here given complexity and etiology of the wound.    PT Comments    POD #6, #12, #19 Assisted OOB to amb an increased distance.  Pt hopes to D/C to home tomorrow.   Recommendations for follow up therapy are one component of a multi-disciplinary discharge planning process, led by the attending physician.  Recommendations may be updated based on patient status, additional  functional criteria and insurance authorization.  Follow Up Recommendations  Home health PT;Supervision for mobility/OOB     Equipment Recommendations  None recommended by PT    Recommendations for Other Services       Precautions / Restrictions Precautions Precautions: Knee;Fall Precaution Comments: VAC to right LE, post op shoe right LE Required Braces or Orthoses: Knee Immobilizer - Right Knee Immobilizer - Right: On at all times Restrictions Weight Bearing Restrictions: No RLE Weight Bearing: Weight bearing as tolerated Other Position/Activity Restrictions: with KI and Walker     Mobility  Bed Mobility Overal bed mobility: Needs Assistance Bed Mobility: Supine to Sit;Sit to Supine     Supine to sit: Modified independent (Device/Increase time) Sit to supine: Min guard   General bed mobility comments: min guard to bring legs up onto high bed.  Pt does well.    Transfers Overall transfer level: Needs assistance Equipment used: Rolling walker (2 wheeled) Transfers: Sit to/from [facility] Sit to Stand: From elevated surface;Min guard Stand pivot transfers: Supervision;Min guard       General transfer comment: Pt using both UEs on RW to stand from elevated bed  Ambulation/Gait Ambulation/Gait assistance: Min guard Gait Distance (Feet): 64 Feet Assistive device: Rolling walker (2 wheeled) Gait Pattern/deviations: Decreased stride length;Antalgic;Step-to pattern;Step-through pattern;Decreased step length - right;Decreased step length - left;Shuffle;Trunk flexed Gait velocity: decreased   General Gait Details: tolerated an increased distance.   Stairs             Wheelchair Mobility    Modified Rankin (Stroke Patients Only)       Balance  Cognition Arousal/Alertness: Awake/alert Behavior During Therapy: WFL for tasks assessed/performed Overall Cognitive Status: Within  Functional Limits for tasks assessed                                 General Comments: A x O  x 3 familiar from previous admit      Exercises      General Comments        Pertinent Vitals/Pain Pain Assessment: Faces Faces Pain Scale: Hurts even more Pain Location: L knee "other" knee Pain Descriptors / Indicators: Aching;Discomfort Pain Intervention(s): Monitored during session;Premedicated before session;Repositioned    Home Living                      Prior Function            PT Goals (current goals can now be found in the care plan section) Progress towards PT goals: Progressing toward goals    Frequency    Min 5X/week      PT Plan Current plan remains appropriate    Co-evaluation              AM-PAC PT "6 Clicks" Mobility   Outcome Measure  Help needed turning from your back to your side while in a flat bed without using bedrails?: A Little Help needed moving from lying on your back to sitting on the side of a flat bed without using bedrails?: A Little Help needed moving to and from a bed to a chair (including a wheelchair)?: A Little Help needed standing up from a chair using your arms (e.g., wheelchair or bedside chair)?: A Little Help needed to walk in hospital room?: A Little Help needed climbing 3-5 steps with a railing? : A Lot 6 Click Score: 17    End of Session Equipment Utilized During Treatment: Gait belt;Right knee immobilizer Activity Tolerance: Patient tolerated treatment well Patient left: in bed;with call bell/phone within reach;with bed alarm set Nurse Communication: Mobility status PT Visit Diagnosis: Other abnormalities of gait and mobility (R26.89) Pain - Right/Left: Right     Time: 1555-1610 PT Time Calculation (min) (ACUTE ONLY): 15 min  Charges:  $Gait Training: 8-22 mins                     {Lukas Pelcher  PTA Acute  Rehabilitation [employer]      [phone] Office       [phone]

## 2021-03-30 NOTE — Plan of Care (Signed)
?  Problem: Activity: ?Goal: Risk for activity intolerance will decrease ?Outcome: Progressing ?  ?Problem: Safety: ?Goal: Ability to remain free from injury will improve ?Outcome: Progressing ?  ?Problem: Pain Managment: ?Goal: General experience of comfort will improve ?Outcome: Progressing ?  ?

## 2021-03-30 NOTE — Progress Notes (Signed)
PROGRESS NOTE    Harry [name]  UVO:[id number] DOB: Nov 10, 1950 DOA: 03/11/2021 PCP: Buzzy Han, MD   Brief Narrative:  Harry [name] is a 70 y.o. male with a history of HTN, COPD, previous tobacco use, lymphedema of legs, and severe right knee arthritis s/p TKA 01/07/2021 subsequently discharged to SNF and suffered anterior knee hardware dislocation on 8/1 that was successfully reduced in the ED. Unfortunately this recurred the following day with persistent patella alta on post reduction films at that time. On 8/3 he underwent revision to TKA, reconstruction of extensor mechanism and MCL.  On 9/14, he was discharged from rehab to home - on 9/20, his wound was found to be fully demarcated requiring further debridement and sent to the hospital the next day for further surgical evaluation - subsequently suffering from hypotension and rapid A. fib with RVR in the setting of anesthetic induction requiring hospitalization and cancellation of the procedure.  Patient able to tolerate procedure on 03/18/2021 for debridement and wound VAC placement.  Given complex wound and difficulty during surgery as outlined below patient is going to follow-up with specialist in [place] per discussion with orthopedic surgery.  Pending PICC line placement IV antibiotic administration and set up in the outpatient setting as well as definitive follow-up at [facility] office will discharge patient home with wound VAC.  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 Wound necrosis complicating healing now s/p I&D, wound vac placement 9/28.  - Continue vancomycin with MRSE growing in cultures. DC'ed ceftriaxone.  - Plastic surgery, Dr. Claudia Desanctis, consulted by orthopedics.  Unsuccessful integra application in OR 74/2 - Wound vac management per orthopedics.  - WBAT RLE with knee immobilizer in place at all  times - Plan for disposition to [facility], who agrees to treat the patient. The plan is for the patient to see Dr. Rocky Link as an outpatient next week) for definitive care of knee infection, will continue IV vancomycin via PICC line at home with outpatient follow-up over the next few weeks, will likely need 6 to 8 weeks of antibiotics total but this will depend on further surgical outpatient evaluation and intervention.  Paroxysmal atrial flutter with RVR, resolved:  -Currently rate controlled, normal sinus rhythm - Echo with preserved LVEF, G1DD, mild aortic root dilatation. TSH 1.043.  - Continue metoprolol 25mg  po BID. - Continue therapeutic lovenox (reinitiated 9/29 per ortho). Will likely need to switch to eliquis at DC for CHA2DS2-VASc score of 3 (age, HTN, atherosclerosis).  - Has cardiology follow up arranged 10/14   Hypokalemia:  -Continue to replete as appropriate  Hypotension:  -Transient, in association with anesthetics, resolved  Questionable right atrial mass:  - Not seen on MRI which was unfortunately limited by claustrophobia and was also done without contrast administration. Prominent eustachian valve was noted.  - Follow up per cardiology in the outpatient setting.   Chronic bilateral lower extremity lymphedema:  - Limited venous U/S showed no DVT.  - No PE identified on CTA chest.  COPD, not in acute exacerbation:  Stable. No medications needed at this time. Wish to avoid beta agonists to avoid provocation of atrial dysrhythmia.   HLD:  -Continue statin. LDL is 39.   Nonobstructive CAD:  -Two-vessel disease w/calcifications by CT. No anginal complaints.  - Continue statin, BB. ASA substituted with anticoagulation as above.     Anemia, chronic - secondary to vitamin B12 deficiency. Acute  blood loss anemia, post procedure -Baseline hemoglobin around 12, post procedure hemoglobin around 9-10 without overt bleeding -B12 low normal, continue to  supplement   Insomnia - Melatonin as needed at bedtime.   RN Pressure Injury Documentation: Pressure Injury 03/11/21 Sacrum Mid Stage 3 -  Full thickness tissue loss. Subcutaneous fat may be visible but bone, tendon or muscle are NOT exposed. Wound is 4.5cmX3.5cm     it is 80% red and about 20% white (Active)  03/11/21 2020  Location: Sacrum  Location Orientation: Mid  Staging: Stage 3 -  Full thickness tissue loss. Subcutaneous fat may be visible but bone, tendon or muscle are NOT exposed.  Wound Description (Comments): Wound is 4.5cmX3.5cm     it is 80% red and about 20% white  Present on Admission: Yes    Obesity: Estimated body mass index is 34.69 kg/m as calculated from the following:   Height as of this encounter: 6\' 4"  (1.93 m).   Weight as of this encounter: 129.3 kg.  DVT prophylaxis: Lovenox full dose Code Status: Full Family Communication: None present  Status is: Inpatient  Dispo: The patient is from: Home              Anticipated d/c is to: Home with home health, PT              Anticipated d/c date is: 24-48 hours pending safe disposition requiring outpatient management including wound VAC, PICC line, IV antibiotics, monitoring as well as follow-up planning with orthopedic surgery in [place].              Patient currently not medically stable for discharge  Consultants:  Orthopedic surgery, plastic surgery  Procedures:  1.  Debridement right knee wound totaling 8 x 8 cm including skin and subcutaneous tissue 2.  Washout of right knee wound 3.  Placement of right knee wound wound VAC totaling 8 x 8 cm  Antimicrobials:  Vancomycin  Subjective: No acute issues or events overnight, currently awaiting further details about follow-up at discharge with facility and specialist in [place].  Objective: Vitals:   03/29/21 0447 03/29/21 1338 03/29/21 2153 03/30/21 0508  BP: (!) 113/54 118/74 106/70 98/72  Pulse: 71 71 72 61  Resp: 16 18    Temp: 97.8 F (36.6  C) 98.1 F (36.7 C) 97.9 F (36.6 C) 97.8 F (36.6 C)  TempSrc: Oral Oral Oral Oral  SpO2: 98% 97% 97% 98%  Weight:      Height:        Intake/Output Summary (Last 24 hours) at 03/30/2021 0744 Last data filed at 03/30/2021 0509 Gross per 24 hour  Intake 700 ml  Output 1150 ml  Net -450 ml    Filed Weights   03/10/21 1510 03/24/21 1247  Weight: 129.3 kg 129.3 kg    Examination:  General:  Pleasantly resting in bed, No acute distress. HEENT:  Normocephalic atraumatic.  Sclerae nonicteric, noninjected.  Extraocular movements intact bilaterally. Neck:  Without mass or deformity.  Trachea is midline. Lungs:  Clear to auscultate bilaterally without rhonchi, wheeze, or rales. Heart:  Regular rate and rhythm.  Without murmurs, rubs, or gallops. Abdomen:  Soft, nontender, nondistended.  Without guarding or rebound. Extremities: Without cyanosis, clubbing, edema, right knee bandage clean dry intact with wound VAC intact, no discernible air leak Vascular:  Dorsalis pedis and posterior tibial pulses palpable bilaterally. Skin:  Warm and dry, no erythema, no ulcerations.   Data Reviewed: I have personally reviewed following labs and  imaging studies  CBC: Recent Labs  Lab 03/24/21 0454 03/26/21 0316 03/28/21 0529 03/30/21 0315  WBC 6.7 6.3 6.7 6.1  HGB 9.6* 9.3* 9.1* 9.5*  HCT 31.2* 31.2* 30.3* 31.8*  MCV 90.2 89.7 90.4 89.6  PLT 248 247 260 426    Basic Metabolic Panel: Recent Labs  Lab 03/24/21 0454 03/26/21 0316 03/28/21 0529 03/30/21 0315  NA 142 138 139 146*  K 3.5 3.3* 3.1* 3.4*  CL 115* 111 108 116*  CO2 22 21* 23 23  GLUCOSE 96 94 92 93  BUN 13 15 12 12   CREATININE 0.81 0.62 0.68 0.75  CALCIUM 8.7* 8.3* 8.3* 8.8*  MG 2.1  --   --   --     GFR: Estimated Creatinine Clearance: 127.9 mL/min (by C-G formula based on SCr of 0.75 mg/dL). Liver Function Tests: No results for input(s): AST, ALT, ALKPHOS, BILITOT, PROT, ALBUMIN in the last 168 hours. No  results for input(s): LIPASE, AMYLASE in the last 168 hours. No results for input(s): AMMONIA in the last 168 hours. Coagulation Profile: No results for input(s): INR, PROTIME in the last 168 hours. Cardiac Enzymes: No results for input(s): CKTOTAL, CKMB, CKMBINDEX, TROPONINI in the last 168 hours. BNP (last 3 results) No results for input(s): PROBNP in the last 8760 hours. HbA1C: No results for input(s): HGBA1C in the last 72 hours. CBG: No results for input(s): GLUCAP in the last 168 hours. Lipid Profile: No results for input(s): CHOL, HDL, LDLCALC, TRIG, CHOLHDL, LDLDIRECT in the last 72 hours. Thyroid Function Tests: No results for input(s): TSH, T4TOTAL, FREET4, T3FREE, THYROIDAB in the last 72 hours. Anemia Panel: No results for input(s): VITAMINB12, FOLATE, FERRITIN, TIBC, IRON, RETICCTPCT in the last 72 hours. Sepsis Labs: No results for input(s): PROCALCITON, LATICACIDVEN in the last 168 hours.  Recent Results (from the past 240 hour(s))  Aerobic/Anaerobic Culture w Gram Stain (surgical/deep wound)     Status: None   Collection Time: 03/24/21  2:32 PM   Specimen: PATH Soft tissue  Result Value Ref Range Status   Specimen Description   Final    WOUND KNEE RIGHT Performed at [facility] [address] [place], [place] [address]    Special Requests   Final    NONE Performed at [facility], [place] [address] [place], [place] [address]    Gram Stain   Final    MODERATE WBC PRESENT,BOTH PMN AND MONONUCLEAR NO ORGANISMS SEEN    Culture   Final    RARE STAPHYLOCOCCUS HAEMOLYTICUS NO ANAEROBES ISOLATED Performed at [facility], [place] [address], [place], [place] [address]    Report Status 03/29/2021 FINAL  Final   Organism ID, Bacteria STAPHYLOCOCCUS HAEMOLYTICUS  Final      Susceptibility   Staphylococcus haemolyticus - MIC*    CIPROFLOXACIN >=8 RESISTANT Resistant     ERYTHROMYCIN >=8 RESISTANT Resistant     GENTAMICIN  >=16 RESISTANT Resistant     OXACILLIN >=4 RESISTANT Resistant     TETRACYCLINE 2 SENSITIVE Sensitive     VANCOMYCIN 1 SENSITIVE Sensitive     TRIMETH/SULFA 160 RESISTANT Resistant     CLINDAMYCIN >=8 RESISTANT Resistant     RIFAMPIN >=32 RESISTANT Resistant     Inducible Clindamycin NEGATIVE Sensitive     * RARE STAPHYLOCOCCUS HAEMOLYTICUS  Aerobic/Anaerobic Culture w Gram Stain (surgical/deep wound)     Status: None   Collection Time: 03/24/21  2:33 PM   Specimen: PATH Soft tissue  Result  Value Ref Range Status   Specimen Description   Final    WOUND KNEE RIGHT DEEP Performed at [facility] [address] [place], [place] [address]    Special Requests   Final    NONE Performed at [facility], [place] [address], [place], [place] [address]    Gram Stain   Final    FEW WBC PRESENT,BOTH PMN AND MONONUCLEAR NO ORGANISMS SEEN    Culture   Final    RARE STAPHYLOCOCCUS EPIDERMIDIS NO ANAEROBES ISOLATED Performed at [facility], [place] [address] [place], [place] [address]    Report Status 03/29/2021 FINAL  Final   Organism ID, Bacteria STAPHYLOCOCCUS EPIDERMIDIS  Final      Susceptibility   Staphylococcus epidermidis - MIC*    CIPROFLOXACIN >=8 RESISTANT Resistant     ERYTHROMYCIN >=8 RESISTANT Resistant     GENTAMICIN >=16 RESISTANT Resistant     OXACILLIN >=4 RESISTANT Resistant     TETRACYCLINE 2 SENSITIVE Sensitive     VANCOMYCIN 2 SENSITIVE Sensitive     TRIMETH/SULFA 160 RESISTANT Resistant     CLINDAMYCIN >=8 RESISTANT Resistant     RIFAMPIN <=0.5 SENSITIVE Sensitive     Inducible Clindamycin NEGATIVE Sensitive     * RARE STAPHYLOCOCCUS EPIDERMIDIS      Radiology Studies: No results found.  Scheduled Meds:  (feeding supplement) PROSource Plus  30 mL Oral BID BM   atorvastatin  20 mg Oral Q1500   Chlorhexidine Gluconate Cloth  6 each Topical Daily   docusate sodium  100 mg Oral BID   enoxaparin (LOVENOX) injection   120 mg Subcutaneous Q12H   metoprolol tartrate  12.5 mg Oral BID   pantoprazole  40 mg Oral Daily   senna  1 tablet Oral BID   sodium chloride flush  10-40 mL Intracatheter Q12H   vitamin B-12  1,000 mcg Oral Daily   Continuous Infusions:  vancomycin 1,000 mg (03/29/21 2221)     LOS: 17 days   Time spent: 42min  [place] C Pariss Hommes, DO [facility]  If 7PM-7AM, please contact night-coverage [email or web address]  03/30/2021, 7:44 AM

## 2021-03-30 NOTE — Progress Notes (Signed)
Physical Therapy Treatment Patient Details Name: Harry [name] MRN: [id number] DOB: 11/06/1950 Today's Date: 03/30/2021   History of Present Illness Harry [name] is a 70 y.o. male with a history of HTN, COPD, previous tobacco use, lymphedema of legs, and severe right knee arthritis s/p TKA 01/07/2021 subsequently discharged to SNF and suffered anterior knee hardware dislocation on 8/1 that was successfully reduced in the ED.      Unfortunately this recurred the following day with persistent patella alta on post reduction films at that time. On 8/3 he underwent revision to TKA, reconstruction of extensor mechanism and MCL.      At his immediate postop visit, he was found to have some questionable skin flap viability. He was treated for a total of 6 weeks in a cylinder cast and had wound checks at regular intervals. On 9/14, he was discharged from rehab to home. On follow-up on 9/20, his wound was found to be fully demarcated and was therefore indicated for debridement.  The following day on 03/11/2021 he was brought the hospital for elective irrigation and debridement of right knee -subsequently suffering from hypotension and rapid A. fib with RVR in the setting of anesthetic induction requiring hospitalization and cancellation of the procedure.  Patient able to tolerate procedure on 03/18/2021 for debridement and wound VAC placement.  Plan for repeat evaluation and possible further surgical intervention per orthopedic surgical team and plastics.  Possible need for transition to tertiary care center pending repeat surgical procedure here given complexity and etiology of the wound.    PT Comments    POD #6, #12, #19 Readjusted KI as it had slid down.  Assisted OOB to amb.  General transfer comment: Pt using both UEs on RW to stand from elevated bed.  General Gait Details: min cues for posture, position from RW, decreased amb distance due to "other" knee hurting.  General bed mobility comments: min  guard to bring legs up onto high bed.  Pt does well.   Pt plans to D/C to home when medically ready.    Recommendations for follow up therapy are one component of a multi-disciplinary discharge planning process, led by the attending physician.  Recommendations may be updated based on patient status, additional functional criteria and insurance authorization.  Follow Up Recommendations  Home health PT;Supervision for mobility/OOB     Equipment Recommendations  None recommended by PT    Recommendations for Other Services       Precautions / Restrictions Precautions Precautions: Knee;Fall Precaution Comments: VAC to right LE, post op shoe right LE Required Braces or Orthoses: Knee Immobilizer - Right Knee Immobilizer - Right: On at all times Restrictions Weight Bearing Restrictions: No RLE Weight Bearing: Weight bearing as tolerated Other Position/Activity Restrictions: with KI and Walker     Mobility  Bed Mobility Overal bed mobility: Needs Assistance Bed Mobility: Supine to Sit;Sit to Supine     Supine to sit: Modified independent (Device/Increase time) Sit to supine: Min guard   General bed mobility comments: min guard to bring legs up onto high bed.  Pt does well.    Transfers Overall transfer level: Needs assistance Equipment used: Rolling walker (2 wheeled) Transfers: Sit to/from [facility] Sit to Stand: From elevated surface;Min guard Stand pivot transfers: Supervision;Min guard       General transfer comment: Pt using both UEs on RW to stand from elevated bed  Ambulation/Gait Ambulation/Gait assistance: Min guard Gait Distance (Feet): 38 Feet Assistive device: Rolling walker (2 wheeled)  Gait Pattern/deviations: Decreased stride length;Antalgic;Step-to pattern;Step-through pattern;Decreased step length - right;Decreased step length - left;Shuffle;Trunk flexed Gait velocity: decreased   General Gait Details: min cues for posture, position  from RW, decreased amb distance due to "other" knee hurting   Stairs             Wheelchair Mobility    Modified Rankin (Stroke Patients Only)       Balance                                            Cognition Arousal/Alertness: Awake/alert Behavior During Therapy: WFL for tasks assessed/performed Overall Cognitive Status: Within Functional Limits for tasks assessed                                 General Comments: A x O  x 3 familiar from previous admit      Exercises      General Comments        Pertinent Vitals/Pain Pain Assessment: Faces Faces Pain Scale: Hurts even more Pain Location: L knee "other" knee Pain Descriptors / Indicators: Aching;Discomfort Pain Intervention(s): Monitored during session;Premedicated before session;Repositioned    Home Living                      Prior Function            PT Goals (current goals can now be found in the care plan section) Progress towards PT goals: Progressing toward goals    Frequency    Min 5X/week      PT Plan Current plan remains appropriate    Co-evaluation              AM-PAC PT "6 Clicks" Mobility   Outcome Measure  Help needed turning from your back to your side while in a flat bed without using bedrails?: A Little Help needed moving from lying on your back to sitting on the side of a flat bed without using bedrails?: A Little Help needed moving to and from a bed to a chair (including a wheelchair)?: A Little Help needed standing up from a chair using your arms (e.g., wheelchair or bedside chair)?: A Little Help needed to walk in hospital room?: A Little Help needed climbing 3-5 steps with a railing? : A Lot 6 Click Score: 17    End of Session Equipment Utilized During Treatment: Gait belt;Right knee immobilizer Activity Tolerance: Patient tolerated treatment well Patient left: in bed;with call bell/phone within reach;with bed alarm  set Nurse Communication: Mobility status PT Visit Diagnosis: Other abnormalities of gait and mobility (R26.89) Pain - Right/Left: Right     Time: 1114-1140 PT Time Calculation (min) (ACUTE ONLY): 26 min  Charges:  $Gait Training: 8-22 mins $Therapeutic Activity: 8-22 mins                    Harry [name]  PTA Acute  Rehabilitation Services Pager      [phone] Office      [phone]

## 2021-03-31 ENCOUNTER — Other Ambulatory Visit (HOSPITAL_COMMUNITY): Payer: Self-pay

## 2021-03-31 MED ORDER — VANCOMYCIN IV (FOR PTA / DISCHARGE USE ONLY): 1500.0000 mg | INTRAVENOUS | 0 refills | Status: AC

## 2021-03-31 MED ORDER — APIXABAN 5 MG PO TABS
5.0000 mg | ORAL_TABLET | Freq: Two times a day (BID) | ORAL | 0 refills | Status: DC
  Filled 2021-03-31: qty 60, 30d supply, fill #0

## 2021-03-31 MED ORDER — COVID-19MRNA BIVAL VACC PFIZER 30 MCG/0.3ML IM SUSP
0.3000 mL | Freq: Once | INTRAMUSCULAR | Status: AC
  Administered 2021-03-31: 0.3 mL via INTRAMUSCULAR
  Filled 2021-03-31: qty 0.3

## 2021-03-31 MED ORDER — INFLUENZA VAC A&B SA ADJ QUAD 0.5 ML IM PRSY
0.5000 mL | PREFILLED_SYRINGE | Freq: Once | INTRAMUSCULAR | Status: AC
  Administered 2021-03-31: 0.5 mL via INTRAMUSCULAR
  Filled 2021-03-31: qty 0.5

## 2021-03-31 MED ORDER — HEPARIN SOD (PORK) LOCK FLUSH 100 UNIT/ML IV SOLN
250.0000 [IU] | INTRAVENOUS | Status: AC | PRN
  Administered 2021-03-31: 250 [IU]
  Filled 2021-03-31: qty 2.5

## 2021-03-31 MED ORDER — VANCOMYCIN HCL 1500 MG/300ML IV SOLN
1500.0000 mg | INTRAVENOUS | Status: DC
  Filled 2021-03-31: qty 300

## 2021-03-31 MED ORDER — METOPROLOL TARTRATE 25 MG PO TABS
12.5000 mg | ORAL_TABLET | Freq: Two times a day (BID) | ORAL | 0 refills | Status: AC
  Filled 2021-03-31: qty 60, 60d supply, fill #0

## 2021-03-31 NOTE — Discharge Instructions (Addendum)

## 2021-03-31 NOTE — Discharge Summary (Signed)
Physician Discharge Summary  Harry [name] TLX:[id number] DOB: 02/06/1951 DOA: 03/11/2021  PCP: Buzzy Han, MD  Admit date: 03/11/2021 Discharge date: 03/31/2021  Admitted From: Home Disposition: Home with home health PT  Recommendations for Outpatient Follow-up:  Follow up with PCP in 1-2 weeks Follow-up with new orthopedic physician Dr. Richardo Priest in [place] at [facility] as scheduled Repeat labs, PICC line, and wound VAC management with home health team  Home Health: PT OT nursing  Discharge Condition: Stable CODE STATUS: Full Diet recommendation: Low-salt low-fat diet  Brief/Interim Summary: Harry [name] is a 70 y.o. male with a history of HTN, COPD, previous tobacco use, lymphedema of legs, and severe right knee arthritis s/p TKA 01/07/2021 subsequently discharged to SNF and suffered anterior knee hardware dislocation on 8/1 that was successfully reduced in the ED. Unfortunately this recurred the following day with persistent patella alta on post reduction films at that time. On 8/3 he underwent revision to TKA, reconstruction of extensor mechanism and MCL.  On 9/14, he was discharged from rehab to home - on 9/20, his wound was found to be fully demarcated requiring further debridement and sent to the hospital the next day for further surgical evaluation - subsequently suffering from hypotension and rapid A. fib with RVR in the setting of anesthetic induction requiring hospitalization and cancellation of the procedure.  Patient able to tolerate procedure on 03/18/2021 for debridement and wound VAC placement.  Given complex wound and difficulty during surgery as outlined below patient is going to follow-up with specialist in [place] per discussion with orthopedic surgery.  Patient is to follow-up with Dr. Richardo Priest in [place] at [facility], PICC line in place, tolerating IV antibiotic administration which will continue outpatient via home health care, wound VAC in  place, home wound VAC charged ready for discharge patient otherwise will need transport which has been set up with case management.  Further evaluation and treatment per orthopedic follow-up outpatient.  Otherwise stable and reasonable for discharge at this time.    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 Wound necrosis complicating healing now s/p I&D, wound vac placement 9/28.  - Continue vancomycin with MRSE growing in cultures via PICC line per orthopedic recommendations - Plastic surgery, Dr. Claudia Desanctis, consulted by orthopedics.  Unsuccessful integra application in OR 74/1 - Wound vac management per orthopedics.  - WBAT RLE with knee immobilizer in place at all times - Plan for disposition to [facility]: [facility], Dr. Rocky Link as an outpatient for tentatively planned spacer and flap, followed by arthrodesis. Continue IV vancomycin via PICC line at home with outpatient follow-up over the next week, will likely need 6 to 8 weeks of antibiotics total but this will depend on further surgical outpatient evaluation and intervention.   Paroxysmal atrial flutter with RVR, resolved:  -Currently rate controlled, normal sinus rhythm - Echo with preserved LVEF, G1DD, mild aortic root dilatation. TSH 1.043.  - Continue metoprolol 14m po BID. -CHA2DS2-VASc 3, transition to Eliquis at discharge - Has cardiology follow up arranged 10/14    Hypokalemia:  -Repeat labs with PCP as scheduled   Hypotension:  -Transient, in association with anesthetics, resolved   Questionable right atrial mass:  - Not seen on MRI which was unfortunately limited by claustrophobia and was also done without contrast administration. Prominent eustachian valve was noted.  - Follow up per cardiology in the outpatient setting.    Chronic  bilateral lower extremity lymphedema:  - Limited venous U/S showed no  DVT.  - No PE identified on CTA chest.   COPD, not in acute exacerbation:  Stable. No medications needed at this time. Wish to avoid beta agonists to avoid provocation of atrial dysrhythmia.   HLD:  -Continue statin. LDL is 39.   Nonobstructive CAD:  -Two-vessel disease w/calcifications by CT. No anginal complaints.  - Continue statin, BB. ASA substituted with anticoagulation as above.     Anemia, chronic - secondary to vitamin B12 deficiency. Acute blood loss anemia, post procedure -Baseline hemoglobin around 12, post procedure hemoglobin around 9-10 without overt bleeding -B12 low normal, continue to supplement   Insomnia - Melatonin as needed at bedtime.   Discharge Instructions  Discharge Instructions     [facility] pharmacist to adjust dose for Vancomycin, Aminoglycosides and other anti-infective therapies as requested by physician.   Complete by: As directed    [facility] to provide Cath Flo 69m   Complete by: As directed    Administer for PICC line occlusion and as ordered by physician for other access device issues.   Anaphylaxis Kit: Provided to treat any anaphylactic reaction to the medication being provided to the patient if First Dose or when requested by physician   Complete by: As directed    Epinephrine 175mml vial / amp: Administer 0.70m109m0.70ml69mubcutaneously once for moderate to severe anaphylaxis, nurse to call physician and pharmacy when reaction occurs and call 911 if needed for immediate care   Diphenhydramine 50mg24mIV vial: Administer 25-50mg 42mM PRN for first dose reaction, rash, itching, mild reaction, nurse to call physician and pharmacy when reaction occurs   Sodium Chloride 0.9% NS 500ml I45mdminister if needed for hypovolemic blood pressure drop or as ordered by physician after call to physician with anaphylactic reaction   Call MD for:   Complete by: As directed    Call Dr. Keith FRocky Linkopedic surgeon in [place] information and to ensure scheduled follow up   Call MD for:  redness, tenderness, or signs of infection (pain, swelling, redness, odor or green/yellow discharge around incision site)   Complete by: As directed    Call MD for:  temperature >100.4   Complete by: As directed    Change dressing on IV access line weekly and PRN   Complete by: As directed    Diet - low sodium heart healthy   Complete by: As directed    Discharge wound care:   Complete by: As directed    Leave wound vac on until specified by orthopedic surgery   Flush IV access with Sodium Chloride 0.9% and Heparin 10 units/ml or 100 units/ml   Complete by: As directed    Home infusion instructions - [facility]   Complete by: As directed    Instructions: Flush IV access with Sodium Chloride 0.9% and Heparin 10units/ml or 100units/ml   Change dressing on IV access line: Weekly and PRN   Instructions Cath Flo 2mg: Ad82mister for PICC Line occlusion and as ordered by physician for other access device   [facility] pharmacist to adjust dose for: Vancomycin, Aminoglycosides and other anti-infective therapies as requested by physician   Increase activity slowly   Complete by: As directed    Method of administration may be changed at the discretion of home infusion pharmacist based upon assessment of the patient and/or caregiver's ability to self-administer the medication ordered   Complete by:  As directed    Outpatient Parenteral Antibiotic Therapy Information Antibiotic: Vancomycin IVPB; Indications for use: knee infection; End Date: 05/07/2021   Complete by: As directed    Antibiotic: Vancomycin IVPB   Indications for use: knee infection   End Date: 05/07/2021      Allergies as of 03/31/2021   No Known Allergies      Medication List     STOP taking these medications    aspirin EC 81 MG tablet   doxycycline 100 MG capsule Commonly known as: VIBRAMYCIN   enoxaparin 30 MG/0.3ML  injection Commonly known as: LOVENOX   HYDROcodone-acetaminophen 7.5-325 MG tablet Commonly known as: NORCO   lisinopril 20 MG tablet Commonly known as: ZESTRIL   oxyCODONE 5 MG immediate release tablet Commonly known as: Oxy IR/ROXICODONE   senna 8.6 MG Tabs tablet Commonly known as: SENOKOT       TAKE these medications    acetaminophen 500 MG tablet Commonly known as: TYLENOL Take 1,000 mg by mouth every 6 (six) hours as needed (pain).   apixaban 5 MG Tabs tablet Commonly known as: ELIQUIS Take 1 tablet (5 mg total) by mouth 2 (two) times daily.   atorvastatin 20 MG tablet Commonly known as: LIPITOR Take 20 mg by mouth daily in the afternoon.   docusate sodium 100 MG capsule Commonly known as: COLACE Take 1 capsule (100 mg total) by mouth 2 (two) times daily. What changed:  when to take this reasons to take this   LUBRICATING EYE DROPS OP Place 1 drop into both eyes 2 (two) times daily.   metoprolol tartrate 25 MG tablet Commonly known as: LOPRESSOR Take 0.5 tablets (12.5 mg total) by mouth 2 (two) times daily.   multivitamin with minerals Tabs tablet Take 1 tablet by mouth in the morning. Men Adult   omeprazole 20 MG capsule Commonly known as: PRILOSEC Take 20 mg by mouth daily in the afternoon.   ondansetron 4 MG tablet Commonly known as: ZOFRAN Take 1 tablet (4 mg total) by mouth every 6 (six) hours as needed for nausea.   PreserVision AREDS 2 Caps Take 1 capsule by mouth 2 (two) times daily.   vancomycin  IVPB Inject 1,000 mg into the vein every 12 (twelve) hours. Indication:  Knee infection First Dose: No Last Day of Therapy:  05/07/21 Labs - "Sunday/Monday:  CBC/D, BMP, and vancomycin trough. Labs - Thursday:  BMP and vancomycin trough Labs - Every other week:  ESR and CRP Method of administration:Elastomeric Method of administration may be changed at the discretion of the patient and/or caregiver's ability to self-administer the medication  ordered.               Discharge Care Instructions  (From admission, onward)           Start     Ordered   03/31/21 0000  Discharge wound care:       Comments: Leave wound vac on until specified by orthopedic surgery   03/31/21 0714   03/26/21 0000  Change dressing on IV access line weekly and PRN  (Home infusion instructions - [facility] )        10" /06/22 1421            No Known Allergies  Consultations: Orthopedic surgery Dr. Lyla Glassing, plastic surgery Dr. Claudia Desanctis  Procedures/Studies: CT Angio Chest Pulmonary Embolism (PE) W or WO Contrast  Result Date: 03/12/2021 CLINICAL DATA:  Recent knee surgery, complicated by surgical revision, acute hypotension, atrial fibrillation,  concern for acute PE EXAM: CT ANGIOGRAPHY CHEST WITH CONTRAST TECHNIQUE: Multidetector CT imaging of the chest was performed using the standard protocol during bolus administration of intravenous contrast. Multiplanar CT image reconstructions and MIPs were obtained to evaluate the vascular anatomy. CONTRAST:  78m OMNIPAQUE IOHEXOL 350 MG/ML SOLN COMPARISON:  05/22/2018 FINDINGS: Cardiovascular: Central pulmonary arteries are normal in caliber and patent. No significant central, saddle, or proximal hilar acute significant pulmonary embolus. Limited assessment of the peripheral segmental branches related to motion artifact and peripheral contrast opacification. Thoracic aortic atherosclerosis without aneurysm or dissection. No acute aortic process. Patent 3 vessel arch anatomy. No mediastinal hemorrhage or hematoma. Native coronary atherosclerosis. Normal heart size. No pericardial effusion. Central venous structures are patent.  No veno-occlusive process. Mediastinum/Nodes: No enlarged mediastinal, hilar, or axillary lymph nodes. Thyroid gland, trachea, and esophagus demonstrate no significant findings. Lungs/Pleura: Trace dependent basilar atelectasis. No acute airspace process, pneumonia, collapse  or consolidation. Negative for edema or interstitial process. No pleural effusion or pneumothorax. Upper Abdomen: Similar scattered numerous hepatic hypodense cysts. Liver is only partially imaged. Gallbladder nondistended. Suspect layering gallbladder sludge versus noncalcified stones. Common bile duct nondilated. Colonic diverticulosis. Aortic atherosclerosis. No acute upper abdominal finding. Musculoskeletal: Degenerative changes of the spine. No chest wall soft tissue abnormality or asymmetry. No acute osseous finding. Review of the MIP images confirms the above findings. IMPRESSION: Negative for significant acute central PE as above. Limited assessment of the smaller peripheral branches. No other acute intrathoracic vascular or non vascular finding. Coronary atherosclerosis Aortic Atherosclerosis (ICD10-I70.0). Electronically Signed   By: MJerilynn Mages  Shick M.D.   On: 03/12/2021 16:32   MR CARDIAC MORPHOLOGY WO CONTRAST  Result Date: 03/13/2021 CLINICAL DATA:  Possible RA mass on echo EXAM: CARDIAC MRI TECHNIQUE: The patient was scanned on a 1.5 Tesla Siemens magnet. A dedicated cardiac coil was used. Functional imaging was done using Fiesta sequences. 2,3, and 4 chamber views were done to assess for RWMA's. Modified Simpson's rule using a short axis stack was used to calculate an ejection fraction on a dedicated work sConservation officer, nature The patient received 0 cc of Gadavist, as study was discontinued due to patient's claustrophobia CONTRAST:  No contrast administered FINDINGS: Left ventricle: Normal size and low normal systolic function LV EF: 562%(Normal 56-78%) Absolute volumes: LV EDV: 189m(Normal 77-195 mL) LV ESV: 9250mNormal 19-72 mL) LV SV: 110m25mormal 51-133 mL) CO: 9.2L/min (Normal 2.8-8.8 L/min) Indexed volumes: LV EDV: 70mL15mm (Normal 47-92 mL/sq-m) LV ESV: 35mL/74m (Normal 13-30 mL/sq-m) LV SV: 35mL/s14m(Normal 32-62 mL/sq-m) CI: 3.5L/min/sq-m (Normal 1.7-4.2 L/min/sq-m) Right  ventricle: Normal size and systolic function RV EF:  59% (Normal 47-74%) Absolute volumes: RV EDV: 162mL (N46ml 88-227 mL) RV ESV: 67mL (No19m 23-103 mL) RV SV: 95mL (Nor1m52-138 mL) CO: 9.4L/min (Normal 2.8-8.8 L/min) Indexed volumes: RV EDV: 62mL/sq-m 24mmal 55-105 mL/sq-m) RV ESV: 25mL/sq-m (39mal 15-43 mL/sq-m) RV SV: 36mL/sq-m (N96ml 32-64 mL/sq-m) CI: 3.6L/min/sq-m (Normal 1.7-4.2 L/min/sq-m) Right atrium: No mass seen Aorta: Mild dilatation of ascending aorta measuring 38mm IMPRESSI60m1. Limited study, as patient was unable to tolerate MRI due to claustrophobia. Contrast was not administered 2.  No right atrial mass seen.  Does have prominent Eustachian valve 3. Normal LV size and low normal systolic function (EF 50%), though q03%tification of LV/RV volumes affected by motion artifact from irregular rhythm and respiratory motion 4.  Normal RV size and systolic function (EF 59%) Electroni55%ly Signed   By: Christopher  SOswaldo Milian/23/2022  13:11   ECHOCARDIOGRAM COMPLETE  Result Date: 03/12/2021    ECHOCARDIOGRAM REPORT   Patient Name:   Harry [name] [facility] Date of Exam: 03/12/2021 Medical Rec #:  [id number]         Height:       76.0 in Accession #:    [id number]        Weight:       285.0 lb Date of Birth:  1951/03/28         BSA:          2.575 m Patient Age:    24 years          BP:           110/74 mmHg Patient Gender: M                 HR:           63 bpm. Exam Location:  Inpatient Procedure: 2D Echo, Cardiac Doppler, Color Doppler and Intracardiac            Opacification Agent Indications:    R94.31 Abnormal EKG  History:        Patient has no prior history of Echocardiogram examinations.                 Abnormal ECG, Arrythmias:Atrial Fibrillation and Tachycardia;                 Risk Factors:Hypertension. Canner.  Sonographer:    Roseanna Rainbow RDCS Referring Phys: [id number] [facility]  Sonographer Comments: Technically difficult study due to poor echo windows, Technically challenging  study due to limited acoustic windows, suboptimal parasternal window, suboptimal apical window, suboptimal subcostal window, no parasternal window and patient is morbidly obese. Image acquisition challenging due to patient body habitus and Image acquisition challenging due to respiratory motion. Nearly non- diagnostic exam. IMPRESSIONS  1. Extremely limited; definity used; normal LV function; hyperechoic area superior right atrium and atrial septum; likely calcification; suggest TEE or cardiac MRI to further assess.  2. Left ventricular ejection fraction, by estimation, is 60 to 65%. The left ventricle has normal function. The left ventricle has no regional wall motion abnormalities. There is mild left ventricular hypertrophy. Left ventricular diastolic parameters are consistent with Grade I diastolic dysfunction (impaired relaxation).  3. Right ventricular systolic function is normal. The right ventricular size is normal.  4. The mitral valve is normal in structure. No evidence of mitral valve regurgitation. No evidence of mitral stenosis.  5. The aortic valve was not well visualized. Aortic valve regurgitation is not visualized. No aortic stenosis is present.  6. Aortic dilatation noted. There is mild dilatation of the aortic root, measuring 40 mm.  7. The inferior vena cava is normal in size with greater than 50% respiratory variability, suggesting right atrial pressure of 3 mmHg. FINDINGS  Left Ventricle: Left ventricular ejection fraction, by estimation, is 60 to 65%. The left ventricle has normal function. The left ventricle has no regional wall motion abnormalities. Definity contrast agent was given IV to delineate the left ventricular  endocardial borders. The left ventricular internal cavity size was normal in size. There is mild left ventricular hypertrophy. Left ventricular diastolic parameters are consistent with Grade I diastolic dysfunction (impaired relaxation). Right Ventricle: The right ventricular  size is normal. Right ventricular systolic function is normal. Left Atrium: Left atrial size was normal in size. Right Atrium: Right atrial size was normal in size. Pericardium: Trivial pericardial effusion is present. Mitral Valve: The mitral  valve is normal in structure. No evidence of mitral valve regurgitation. No evidence of mitral valve stenosis. Tricuspid Valve: The tricuspid valve is normal in structure. Tricuspid valve regurgitation is not demonstrated. No evidence of tricuspid stenosis. Aortic Valve: The aortic valve was not well visualized. Aortic valve regurgitation is not visualized. No aortic stenosis is present. Pulmonic Valve: The pulmonic valve was not well visualized. Pulmonic valve regurgitation is not visualized. Aorta: Aortic dilatation noted. There is mild dilatation of the aortic root, measuring 40 mm. Venous: The inferior vena cava is normal in size with greater than 50% respiratory variability, suggesting right atrial pressure of 3 mmHg. IAS/Shunts: The interatrial septum was not well visualized. Additional Comments: Extremely limited; definity used; normal LV function; hyperechoic area superior right atrium and atrial septum; likely calcification; suggest TEE or cardiac MRI to further assess.  LEFT VENTRICLE PLAX 2D LVIDd:         5.10 cm LVIDs:         3.90 cm LV PW:         1.40 cm LV IVS:        1.40 cm LVOT diam:     2.30 cm LV SV:         66 LV SV Index:   25 LVOT Area:     4.15 cm  LV Volumes (MOD) LV vol d, MOD A2C: 120.0 ml LV vol d, MOD A4C: 192.0 ml LV vol s, MOD A2C: 64.7 ml LV vol s, MOD A4C: 71.7 ml LV SV MOD A2C:     55.3 ml LV SV MOD A4C:     192.0 ml LV SV MOD BP:      83.3 ml RIGHT VENTRICLE RV S prime:     15.70 cm/s TAPSE (M-mode): 1.8 cm LEFT ATRIUM             Index       RIGHT ATRIUM           Index LA diam:        3.90 cm 1.51 cm/m  RA Area:     22.00 cm LA Vol (A2C):   76.7 ml 29.79 ml/m RA Volume:   70.60 ml  27.42 ml/m LA Vol (A4C):   45.4 ml 17.63 ml/m LA  Biplane Vol: 60.3 ml 23.42 ml/m  AORTIC VALVE LVOT Vmax:   94.90 cm/s LVOT Vmean:  59.300 cm/s LVOT VTI:    0.158 m  AORTA Ao Root diam: 4.00 cm MITRAL VALVE MV Area (PHT): 2.80 cm    SHUNTS MV Decel Time: 271 msec    Systemic VTI:  0.16 m MV E velocity: 45.00 cm/s  Systemic Diam: 2.30 cm MV A velocity: 74.60 cm/s MV E/A ratio:  0.60 Kirk Ruths MD Electronically signed by Kirk Ruths MD Signature Date/Time: 03/12/2021/11:48:23 AM    Final    VAS [place] LOWER EXTREMITY VENOUS (DVT)  Result Date: 03/12/2021  Lower Venous DVT Study Patient Name:  Harry [name] [facility]  Date of Exam:   03/12/2021 Medical Rec #: [id number]          Accession #:    [id number] Date of Birth: 02-Dec-1950          Patient Gender: M Patient Age:   59 years Exam Location:  [facility] Procedure:      VAS [place] LOWER EXTREMITY VENOUS (DVT) Referring Phys: Lisbeth Renshaw DUNN --------------------------------------------------------------------------------  Indications: Edema.  Risk Factors: Surgery. Limitations: Body habitus, poor ultrasound/tissue interface, bandages and orthopaedic appliance. Comparison Study: No  prior studies. Performing Technologist: Oliver Hum RVT  Examination Guidelines: A complete evaluation includes B-mode imaging, spectral Doppler, color Doppler, and power Doppler as needed of all accessible portions of each vessel. Bilateral testing is considered an integral part of a complete examination. Limited examinations for reoccurring indications may be performed as noted. The reflux portion of the exam is performed with the patient in reverse Trendelenburg.  +---------+---------------+---------+-----------+----------+-------------------+ RIGHT    CompressibilityPhasicitySpontaneityPropertiesThrombus Aging      +---------+---------------+---------+-----------+----------+-------------------+ CFV      Full           Yes      Yes                                       +---------+---------------+---------+-----------+----------+-------------------+ SFJ      Full                                                             +---------+---------------+---------+-----------+----------+-------------------+ FV Prox  Full                                                             +---------+---------------+---------+-----------+----------+-------------------+ FV Mid   Full                                                             +---------+---------------+---------+-----------+----------+-------------------+ FV Distal               Yes      Yes                                      +---------+---------------+---------+-----------+----------+-------------------+ PFV      Full                                                             +---------+---------------+---------+-----------+----------+-------------------+ POP      Full           Yes      Yes                                      +---------+---------------+---------+-----------+----------+-------------------+ PTV      Full                                                             +---------+---------------+---------+-----------+----------+-------------------+  PERO                                                  Not well visualized +---------+---------------+---------+-----------+----------+-------------------+   +---------+---------------+---------+-----------+----------+--------------+ LEFT     CompressibilityPhasicitySpontaneityPropertiesThrombus Aging +---------+---------------+---------+-----------+----------+--------------+ CFV      Full           Yes      Yes                                 +---------+---------------+---------+-----------+----------+--------------+ SFJ      Full                                                        +---------+---------------+---------+-----------+----------+--------------+ FV Prox  Full                                                         +---------+---------------+---------+-----------+----------+--------------+ FV Mid   Full                                                        +---------+---------------+---------+-----------+----------+--------------+ FV Distal               Yes      Yes                                 +---------+---------------+---------+-----------+----------+--------------+ PFV      Full                                                        +---------+---------------+---------+-----------+----------+--------------+ POP      Full           Yes      Yes                                 +---------+---------------+---------+-----------+----------+--------------+ PTV      Full                                                        +---------+---------------+---------+-----------+----------+--------------+ PERO     Full                                                        +---------+---------------+---------+-----------+----------+--------------+  Summary: RIGHT: - There is no evidence of deep vein thrombosis in the lower extremity. However, portions of this examination were limited- see technologist comments above.  - No cystic structure found in the popliteal fossa.  LEFT: - There is no evidence of deep vein thrombosis in the lower extremity. However, portions of this examination were limited- see technologist comments above.  - No cystic structure found in the popliteal fossa.  *See table(s) above for measurements and observations. Electronically signed by Jamelle Haring on 03/12/2021 at 6:53:04 PM.    Final    [place] EKG SITE RITE  Result Date: 03/26/2021 If Site Rite image not attached, placement could not be confirmed due to current cardiac rhythm.    Subjective: No acute issues or events overnight denies nausea vomiting diarrhea constipation headache fevers chills chest pain   Discharge Exam: Vitals:   03/30/21 2052 03/31/21 0507  BP:  105/63 101/73  Pulse: 63 66  Resp: 16 16  Temp: 98 F (36.7 C) 98.1 F (36.7 C)  SpO2: 96% 94%   Vitals:   03/29/21 2153 03/30/21 0508 03/30/21 2052 03/31/21 0507  BP: 106/70 98/72 105/63 101/73  Pulse: 72 61 63 66  Resp:   16 16  Temp: 97.9 F (36.6 C) 97.8 F (36.6 C) 98 F (36.7 C) 98.1 F (36.7 C)  TempSrc: Oral Oral Oral Oral  SpO2: 97% 98% 96% 94%  Weight:      Height:       General:  Pleasantly resting in bed, No acute distress. HEENT:  Normocephalic atraumatic.  Sclerae nonicteric, noninjected.  Extraocular movements intact bilaterally. Neck:  Without mass or deformity.  Trachea is midline. Lungs:  Clear to auscultate bilaterally without rhonchi, wheeze, or rales. Heart:  Regular rate and rhythm.  Without murmurs, rubs, or gallops. Abdomen:  Soft, nontender, nondistended.  Without guarding or rebound. Extremities: Without cyanosis, clubbing, edema, right knee bandage clean dry intact with wound VAC intact, no discernible air leak Vascular:  Dorsalis pedis and posterior tibial pulses palpable bilaterally. Skin:  Warm and dry, no erythema  The results of significant diagnostics from this hospitalization (including imaging, microbiology, ancillary and laboratory) are listed below for reference.     Microbiology: Recent Results (from the past 240 hour(s))  Aerobic/Anaerobic Culture w Gram Stain (surgical/deep wound)     Status: None   Collection Time: 03/24/21  2:32 PM   Specimen: PATH Soft tissue  Result Value Ref Range Status   Specimen Description   Final    WOUND KNEE RIGHT Performed at [facility] [address] [place], [place] [address]    Special Requests   Final    NONE Performed at [facility], [place] [address], [place], [place] [address]    Gram Stain   Final    MODERATE WBC PRESENT,BOTH PMN AND MONONUCLEAR NO ORGANISMS SEEN    Culture   Final    RARE STAPHYLOCOCCUS HAEMOLYTICUS NO ANAEROBES  ISOLATED Performed at [facility], [place] [address] [place], [place] [address]    Report Status 03/29/2021 FINAL  Final   Organism ID, Bacteria STAPHYLOCOCCUS HAEMOLYTICUS  Final      Susceptibility   Staphylococcus haemolyticus - MIC*    CIPROFLOXACIN >=8 RESISTANT Resistant     ERYTHROMYCIN >=8 RESISTANT Resistant     GENTAMICIN >=16 RESISTANT Resistant     OXACILLIN >=4 RESISTANT Resistant     TETRACYCLINE 2 SENSITIVE Sensitive     VANCOMYCIN 1 SENSITIVE Sensitive  TRIMETH/SULFA 160 RESISTANT Resistant     CLINDAMYCIN >=8 RESISTANT Resistant     RIFAMPIN >=32 RESISTANT Resistant     Inducible Clindamycin NEGATIVE Sensitive     * RARE STAPHYLOCOCCUS HAEMOLYTICUS  Aerobic/Anaerobic Culture w Gram Stain (surgical/deep wound)     Status: None   Collection Time: 03/24/21  2:33 PM   Specimen: PATH Soft tissue  Result Value Ref Range Status   Specimen Description   Final    WOUND KNEE RIGHT DEEP Performed at [facility], [place] [address], [place], [place] [address]    Special Requests   Final    NONE Performed at [facility], [place] [address], [place], [place] [address]    Gram Stain   Final    FEW WBC PRESENT,BOTH PMN AND MONONUCLEAR NO ORGANISMS SEEN    Culture   Final    RARE STAPHYLOCOCCUS EPIDERMIDIS NO ANAEROBES ISOLATED Performed at [facility], [place] [address], [place],  [address]    Report Status 03/29/2021 FINAL  Final   Organism ID, Bacteria STAPHYLOCOCCUS EPIDERMIDIS  Final      Susceptibility   Staphylococcus epidermidis - MIC*    CIPROFLOXACIN >=8 RESISTANT Resistant     ERYTHROMYCIN >=8 RESISTANT Resistant     GENTAMICIN >=16 RESISTANT Resistant     OXACILLIN >=4 RESISTANT Resistant     TETRACYCLINE 2 SENSITIVE Sensitive     VANCOMYCIN 2 SENSITIVE Sensitive     TRIMETH/SULFA 160 RESISTANT Resistant     CLINDAMYCIN >=8 RESISTANT Resistant     RIFAMPIN <=0.5 SENSITIVE Sensitive     Inducible  Clindamycin NEGATIVE Sensitive     * RARE STAPHYLOCOCCUS EPIDERMIDIS     Labs: BNP (last 3 results) No results for input(s): BNP in the last 8760 hours. Basic Metabolic Panel: Recent Labs  Lab 03/26/21 0316 03/28/21 0529 03/30/21 0315  NA 138 139 146*  K 3.3* 3.1* 3.4*  CL 111 108 116*  CO2 21* 23 23  GLUCOSE 94 92 93  BUN '15 12 12  ' CREATININE 0.62 0.68 0.75  CALCIUM 8.3* 8.3* 8.8*   Liver Function Tests: No results for input(s): AST, ALT, ALKPHOS, BILITOT, PROT, ALBUMIN in the last 168 hours. No results for input(s): LIPASE, AMYLASE in the last 168 hours. No results for input(s): AMMONIA in the last 168 hours. CBC: Recent Labs  Lab 03/26/21 0316 03/28/21 0529 03/30/21 0315  WBC 6.3 6.7 6.1  HGB 9.3* 9.1* 9.5*  HCT 31.2* 30.3* 31.8*  MCV 89.7 90.4 89.6  PLT 247 260 267   Cardiac Enzymes: No results for input(s): CKTOTAL, CKMB, CKMBINDEX, TROPONINI in the last 168 hours. BNP: Invalid input(s): POCBNP CBG: No results for input(s): GLUCAP in the last 168 hours. D-Dimer No results for input(s): DDIMER in the last 72 hours. Hgb A1c No results for input(s): HGBA1C in the last 72 hours. Lipid Profile No results for input(s): CHOL, HDL, LDLCALC, TRIG, CHOLHDL, LDLDIRECT in the last 72 hours. Thyroid function studies No results for input(s): TSH, T4TOTAL, T3FREE, THYROIDAB in the last 72 hours.  Invalid input(s): FREET3 Anemia work up No results for input(s): VITAMINB12, FOLATE, FERRITIN, TIBC, IRON, RETICCTPCT in the last 72 hours. Urinalysis    Component Value Date/Time   COLORURINE YELLOW 12/26/2020 0923   APPEARANCEUR HAZY (A) 12/26/2020 0923   LABSPEC 1.020 12/26/2020 0923   PHURINE 5.0 12/26/2020 [place] 12/26/2020 [place] 12/26/2020 [place] 12/26/2020 9201  KETONESUR 5 (A) 12/26/2020 0923   PROTEINUR NEGATIVE 12/26/2020 0923   NITRITE NEGATIVE 12/26/2020 0923   LEUKOCYTESUR SMALL (A) 12/26/2020 0923    Sepsis Labs Invalid input(s): PROCALCITONIN,  WBC,  LACTICIDVEN Microbiology Recent Results (from the past 240 hour(s))  Aerobic/Anaerobic Culture w Gram Stain (surgical/deep wound)     Status: None   Collection Time: 03/24/21  2:32 PM   Specimen: PATH Soft tissue  Result Value Ref Range Status   Specimen Description   Final    WOUND KNEE RIGHT Performed at [facility], [place] [address], [place], [place] [address]    Special Requests   Final    NONE Performed at [facility], [place] [address] [place], [place] [address]    Gram Stain   Final    MODERATE WBC PRESENT,BOTH PMN AND MONONUCLEAR NO ORGANISMS SEEN    Culture   Final    RARE STAPHYLOCOCCUS HAEMOLYTICUS NO ANAEROBES ISOLATED Performed at [facility], [place] [address], [place], [place] [address]    Report Status 03/29/2021 FINAL  Final   Organism ID, Bacteria STAPHYLOCOCCUS HAEMOLYTICUS  Final      Susceptibility   Staphylococcus haemolyticus - MIC*    CIPROFLOXACIN >=8 RESISTANT Resistant     ERYTHROMYCIN >=8 RESISTANT Resistant     GENTAMICIN >=16 RESISTANT Resistant     OXACILLIN >=4 RESISTANT Resistant     TETRACYCLINE 2 SENSITIVE Sensitive     VANCOMYCIN 1 SENSITIVE Sensitive     TRIMETH/SULFA 160 RESISTANT Resistant     CLINDAMYCIN >=8 RESISTANT Resistant     RIFAMPIN >=32 RESISTANT Resistant     Inducible Clindamycin NEGATIVE Sensitive     * RARE STAPHYLOCOCCUS HAEMOLYTICUS  Aerobic/Anaerobic Culture w Gram Stain (surgical/deep wound)     Status: None   Collection Time: 03/24/21  2:33 PM   Specimen: PATH Soft tissue  Result Value Ref Range Status   Specimen Description   Final    WOUND KNEE RIGHT DEEP Performed at [facility] [address] [place], [place] [address]    Special Requests   Final    NONE Performed at [facility], [place] [address] [place], [place] [address]    Gram Stain   Final    FEW WBC PRESENT,BOTH  PMN AND MONONUCLEAR NO ORGANISMS SEEN    Culture   Final    RARE STAPHYLOCOCCUS EPIDERMIDIS NO ANAEROBES ISOLATED Performed at [facility], [place] [address] [place], [place] [address]    Report Status 03/29/2021 FINAL  Final   Organism ID, Bacteria STAPHYLOCOCCUS EPIDERMIDIS  Final      Susceptibility   Staphylococcus epidermidis - MIC*    CIPROFLOXACIN >=8 RESISTANT Resistant     ERYTHROMYCIN >=8 RESISTANT Resistant     GENTAMICIN >=16 RESISTANT Resistant     OXACILLIN >=4 RESISTANT Resistant     TETRACYCLINE 2 SENSITIVE Sensitive     VANCOMYCIN 2 SENSITIVE Sensitive     TRIMETH/SULFA 160 RESISTANT Resistant     CLINDAMYCIN >=8 RESISTANT Resistant     RIFAMPIN <=0.5 SENSITIVE Sensitive     Inducible Clindamycin NEGATIVE Sensitive     * RARE STAPHYLOCOCCUS EPIDERMIDIS     Time coordinating discharge: Over 30 minutes  SIGNED:   Little Ishikawa, DO [facility] 03/31/2021, 7:14 AM Pager   If 7PM-7AM, please contact night-coverage [email or web address]

## 2021-03-31 NOTE — Progress Notes (Signed)
Physical Therapy Treatment Patient Details Name: Harry [name] MRN: [id number] DOB: 03-20-1951 Today's Date: 03/31/2021   History of Present Illness Harry [name] is a 70 y.o. male with a history of HTN, COPD, previous tobacco use, lymphedema of legs, and severe right knee arthritis s/p TKA 01/07/2021 subsequently discharged to SNF and suffered anterior knee hardware dislocation on 8/1 that was successfully reduced in the ED.      Unfortunately this recurred the following day with persistent patella alta on post reduction films at that time. On 8/3 he underwent revision to TKA, reconstruction of extensor mechanism and MCL.      At his immediate postop visit, he was found to have some questionable skin flap viability. He was treated for a total of 6 weeks in a cylinder cast and had wound checks at regular intervals. On 9/14, he was discharged from rehab to home. On follow-up on 9/20, his wound was found to be fully demarcated and was therefore indicated for debridement.  The following day on 03/11/2021 he was brought the hospital for elective irrigation and debridement of right knee -subsequently suffering from hypotension and rapid A. fib with RVR in the setting of anesthetic induction requiring hospitalization and cancellation of the procedure.  Patient able to tolerate procedure on 03/18/2021 for debridement and wound VAC placement.  Plan for repeat evaluation and possible further surgical intervention per orthopedic surgical team and plastics.  Possible need for transition to tertiary care center pending repeat surgical procedure here given complexity and etiology of the wound.    PT Comments    Pt wanted to attempt going up one step prior to d/c. Pt was unable d/t pain and fear of falling. Pt wife present and reports pt does not have to do the one step to get to kitchen. Pt reports he may try again with HHPT.  Recommendations for follow up therapy are one component of a multi-disciplinary  discharge planning process, led by the attending physician.  Recommendations may be updated based on patient status, additional functional criteria and insurance authorization.  Follow Up Recommendations  Home health PT;Supervision for mobility/OOB     Equipment Recommendations  None recommended by PT    Recommendations for Other Services       Precautions / Restrictions Precautions Precautions: Knee;Fall Precaution Comments: VAC to right LE, post op shoe right LE Restrictions RLE Weight Bearing: Weight bearing as tolerated     Mobility  Bed Mobility Overal bed mobility: Needs Assistance Bed Mobility: Supine to Sit;Sit to Supine     Supine to sit: Modified independent (Device/Increase time) Sit to supine: Min assist   General bed mobility comments: assist to lift RLE into bed    Transfers Overall transfer level: Needs assistance Equipment used: Rolling walker (2 wheeled) Transfers: Sit to/from Stand Sit to Stand: From elevated surface;Min guard         General transfer comment: Pt using both UEs on RW to stand from elevated bed  Ambulation/Gait Ambulation/Gait assistance: Min guard Gait Distance (Feet): 25 Feet Assistive device: Rolling walker (2 wheeled) Gait Pattern/deviations: Decreased stride length;Antalgic;Step-to pattern;Step-through pattern;Decreased step length - right;Decreased step length - left     General Gait Details: limited by pain in L knee, cues for sequence   Stairs Stairs: Yes   Stair Management: No rails;With walker   General stair comments: attempted up/down one step, pt felt he was unable d/t L knee pain (no-op LE).  pt declined to attempt backward technique. PT demo'd for pt  and wife, he states he may try it with HHPT   Wheelchair Mobility    Modified Rankin (Stroke Patients Only)       Balance     Sitting balance-Leahy Scale: Good     Standing balance support: During functional activity;Bilateral upper extremity  supported Standing balance-Leahy Scale: Poor Standing balance comment: reliant on UE support, fearful of falling                            Cognition Arousal/Alertness: Awake/alert Behavior During Therapy: WFL for tasks assessed/performed Overall Cognitive Status: Within Functional Limits for tasks assessed                                        Exercises      General Comments        Pertinent Vitals/Pain Pain Assessment: Faces Faces Pain Scale: Hurts whole lot Pain Location: L knee "other" knee Pain Descriptors / Indicators: Aching;Discomfort Pain Intervention(s): Limited activity within patient's tolerance;Monitored during session;Premedicated before session    Home Living                      Prior Function            PT Goals (current goals can now be found in the care plan section) Acute Rehab PT Goals Patient Stated Goal: Regain use of knee PT Goal Formulation: With patient Time For Goal Achievement: 04/02/21 Potential to Achieve Goals: Good Progress towards PT goals: Progressing toward goals    Frequency    Min 5X/week      PT Plan Current plan remains appropriate    Co-evaluation              AM-PAC PT "6 Clicks" Mobility   Outcome Measure  Help needed turning from your back to your side while in a flat bed without using bedrails?: A Little Help needed moving from lying on your back to sitting on the side of a flat bed without using bedrails?: A Little Help needed moving to and from a bed to a chair (including a wheelchair)?: A Little Help needed standing up from a chair using your arms (e.g., wheelchair or bedside chair)?: A Little Help needed to walk in hospital room?: A Little Help needed climbing 3-5 steps with a railing? : A Little 6 Click Score: 18    End of Session   Activity Tolerance: Patient tolerated treatment well Patient left: in bed;with call bell/phone within reach;with bed alarm  set;with family/visitor present Nurse Communication: Mobility status PT Visit Diagnosis: Other abnormalities of gait and mobility (R26.89) Pain - Right/Left: Right Pain - part of body: Knee     Time: 5681-2751 PT Time Calculation (min) (ACUTE ONLY): 18 min  Charges:  $Gait Training: 8-22 mins                     Baxter Flattery, PT  Acute Rehab Dept ([place]) [phone] Pager [phone]  03/31/2021    [facility] 03/31/2021, 12:59 PM

## 2021-03-31 NOTE — TOC Transition Note (Signed)
Transition of Care [facility] - CM/SW Discharge Note  Patient Details  Name: Harry [name] MRN: [id number] Date of Birth: 07-Apr-1951  Transition of Care [facility]) CM/SW Contact:  Sherie Don, LCSW Phone Number: 03/31/2021, 1:20 PM  Clinical Narrative: Patient will discharge home today and will need to go by PTAR. Patient confirmed he will discharge home to the address in his chart. CSW notified Pam with [facility] and Seth Bake with [facility] of discharge. Medical necessity form done; PTAR scheduled. RN updated. TOC signing off.  Final next level of care: [place] Barriers to Discharge: Barriers Resolved  Patient Goals and CMS Choice Patient states their goals for this hospitalization and ongoing recovery are:: To return back home. CMS Medicare.gov Compare Post Acute Care list provided to:: Patient Choice offered to / list presented to : Patient  Discharge Plan and Services HH Arranged: RN [facility] Agency: [facility] [employer] Date [facility] Agency Contacted: 03/19/21  Readmission Risk Interventions No flowsheet data found.

## 2021-03-31 NOTE — Plan of Care (Signed)
  Problem: Activity: Goal: Risk for activity intolerance will decrease Outcome: Progressing   Problem: Pain Managment: Goal: General experience of comfort will improve Outcome: Progressing   Problem: Safety: Goal: Ability to remain free from injury will improve Outcome: Progressing   

## 2021-03-31 NOTE — Progress Notes (Signed)
Pharmacy Antibiotic Note  Harry [name] is a 69 y.o. male admitted on 03/11/2021 with  wound infection .  Pharmacy has been consulted for vancomycin dosing.  Pt underwent I&D with placement of wound vac on 9/28. Now s/p debridement, washout, and replace wound vac by plastic surgery. Goal of vancomycin therapy is typically AUC 400-550, however his has only been validated for MRSA infections, not MRSE, However, when studied, the AUC for therapeutic effectiveness was >384. AUC of 390 calculated 10/9 would be appropriate for this patient, especially considering the likelihood of accumulation of Vancomycin with long-term use. Renal function is stable.   03/26/21 Changed to 1500 mg q 24 hours in anticipation of accumulation with long-term therapy  03/29/21 Vanc pk= 34, vanc rm = 18, vanc AUC = 390  Plan: START Vancomycin 1,500 mg Q24H starting 04/01/21  OPAT orders adjusted and signed (see regimen below)  Follow renal function  Height: 6\' 4"  (193 cm) Weight: 129.3 kg (285 lb 0.9 oz) IBW/kg (Calculated) : 86.8  Temp (24hrs), Avg:98.1 F (36.7 C), Min:98 F (36.7 C), Max:98.1 F (36.7 C)  Recent Labs  Lab 03/25/21 1736 03/26/21 0316 03/28/21 0529 03/28/21 2100 03/29/21 0345 03/30/21 0315  WBC  --  6.3 6.7  --   --  6.1  CREATININE  --  0.62 0.68  --   --  0.75  VANCOTROUGH 21*  --   --   --   --   --   VANCOPEAK  --   --   --  34  --   --   VANCORANDOM  --   --   --   --  18  --      Estimated Creatinine Clearance: 127.9 mL/min (by C-G formula based on SCr of 0.75 mg/dL).    Recent Results (from the past 240 hour(s))  Aerobic/Anaerobic Culture w Gram Stain (surgical/deep wound)     Status: None   Collection Time: 03/24/21  2:32 PM   Specimen: PATH Soft tissue  Result Value Ref Range Status   Specimen Description   Final    WOUND KNEE RIGHT Performed at [facility] [address], [place], [place] [address]    Special Requests   Final     NONE Performed at [facility] [place] [address], [place], [place] [address]    Gram Stain   Final    MODERATE WBC PRESENT,BOTH PMN AND MONONUCLEAR NO ORGANISMS SEEN    Culture   Final    RARE STAPHYLOCOCCUS HAEMOLYTICUS NO ANAEROBES ISOLATED Performed at [facility], [place] [address], [place], [place] [address]    Report Status 03/29/2021 FINAL  Final   Organism ID, Bacteria STAPHYLOCOCCUS HAEMOLYTICUS  Final      Susceptibility   Staphylococcus haemolyticus - MIC*    CIPROFLOXACIN >=8 RESISTANT Resistant     ERYTHROMYCIN >=8 RESISTANT Resistant     GENTAMICIN >=16 RESISTANT Resistant     OXACILLIN >=4 RESISTANT Resistant     TETRACYCLINE 2 SENSITIVE Sensitive     VANCOMYCIN 1 SENSITIVE Sensitive     TRIMETH/SULFA 160 RESISTANT Resistant     CLINDAMYCIN >=8 RESISTANT Resistant     RIFAMPIN >=32 RESISTANT Resistant     Inducible Clindamycin NEGATIVE Sensitive     * RARE STAPHYLOCOCCUS HAEMOLYTICUS  Aerobic/Anaerobic Culture w Gram Stain (surgical/deep wound)     Status: None   Collection Time: 03/24/21  2:33 PM   Specimen: PATH Soft tissue  Result Value Ref  Range Status   Specimen Description   Final    WOUND KNEE RIGHT DEEP Performed at [facility] [address] [place], [place] [address]    Special Requests   Final    NONE Performed at [facility], [place] [address] [place], [place] [address]    Gram Stain   Final    FEW WBC PRESENT,BOTH PMN AND MONONUCLEAR NO ORGANISMS SEEN    Culture   Final    RARE STAPHYLOCOCCUS EPIDERMIDIS NO ANAEROBES ISOLATED Performed at [facility], [place] [address] [place], [place] [address]    Report Status 03/29/2021 FINAL  Final   Organism ID, Bacteria STAPHYLOCOCCUS EPIDERMIDIS  Final      Susceptibility   Staphylococcus epidermidis - MIC*    CIPROFLOXACIN >=8 RESISTANT Resistant     ERYTHROMYCIN >=8 RESISTANT Resistant     GENTAMICIN >=16 RESISTANT Resistant      OXACILLIN >=4 RESISTANT Resistant     TETRACYCLINE 2 SENSITIVE Sensitive     VANCOMYCIN 2 SENSITIVE Sensitive     TRIMETH/SULFA 160 RESISTANT Resistant     CLINDAMYCIN >=8 RESISTANT Resistant     RIFAMPIN <=0.5 SENSITIVE Sensitive     Inducible Clindamycin NEGATIVE Sensitive     * RARE STAPHYLOCOCCUS EPIDERMIDIS    OUTPATIENT  PARENTERAL ANTIBIOTIC THERAPY (OPAT)  Indication: Knee infection Regimen: vancomycin 1.5g IV q24h (changed from 1g IV q12h as levels tend to accumluate over time) End date: 05/07/21  IV antibiotic discharge orders are pended. To discharging provider:  please sign these orders via discharge navigator,  Select New Orders & click on the button choice - Manage This Unsigned Work.    Adria Dill, PharmD PGY-1 Acute Care Resident  03/31/2021 11:34 AM

## 2021-03-31 NOTE — Progress Notes (Signed)
VAST RN checked in on patient as he remains on line list. Pt is waiting for PTAR to pick him up. No line care needed as his line was heparin locked earlier today.

## 2021-04-03 ENCOUNTER — Ambulatory Visit: Payer: Medicare Other | Admitting: Cardiology

## 2021-04-07 ENCOUNTER — Telehealth: Payer: Self-pay | Admitting: Plastic Surgery

## 2021-04-07 NOTE — Telephone Encounter (Signed)
Patient requested to cancel his post op appointment, stating that he never received a hospitality call from the office and is moving forward with a Psychiatric nurse in [place].

## 2021-04-09 ENCOUNTER — Encounter: Payer: Medicare Other | Admitting: Surgical

## 2021-04-17 LAB — FUNGUS CULTURE WITH STAIN

## 2021-04-17 LAB — FUNGAL ORGANISM REFLEX

## 2021-04-17 LAB — FUNGUS CULTURE RESULT

## 2021-04-28 ENCOUNTER — Other Ambulatory Visit (HOSPITAL_COMMUNITY): Payer: Self-pay

## 2021-04-29 ENCOUNTER — Other Ambulatory Visit (HOSPITAL_COMMUNITY): Payer: Self-pay

## 2021-04-29 MED ORDER — APIXABAN 5 MG PO TABS
5.0000 mg | ORAL_TABLET | Freq: Two times a day (BID) | ORAL | 3 refills | Status: AC
  Filled 2021-04-29: qty 60, 30d supply, fill #0

## 2021-05-02 ENCOUNTER — Other Ambulatory Visit (HOSPITAL_COMMUNITY): Payer: Self-pay

## 2021-06-05 ENCOUNTER — Other Ambulatory Visit (HOSPITAL_COMMUNITY): Payer: Self-pay

## 2021-06-06 NOTE — Discharge Summary (Signed)
 [facility]  Discharge Summary  PCP: Linus LOISE Maier, MD Discharge Details   Admit date:         05/27/2021 Discharge date:         06/06/2021  Hospital LOS:    10 days  Allergies  Allergen Reactions  . Other Hallucinations    opiods    Active Hospital Problems   Diagnosis Date Noted POA  . *Infected prosthetic knee joint, initial encounter (*) 05/27/2021 Not Applicable  . Gross hematuria 06/04/2021 No  . Chronic diastolic (congestive) heart failure (*) 05/27/2021 Yes  . Bilateral lower extremity edema 05/27/2021 Yes  . Anemia of infection 05/07/2021 Yes  . Open wound of right knee 05/07/2021 Yes  . Status post replacement of knee joint 05/07/2021 Not Applicable  . Obesity (BMI 30-39.9) 05/05/2021 Yes  . Paroxysmal atrial fibrillation (*) 05/05/2021 Yes  . Atherosclerosis of native coronary artery of native heart without angina pectoris 02/08/2019 Yes  . Chronic obstructive pulmonary disease (*) 05/26/2018 Yes  . Gastroesophageal reflux disease 03/06/2018 Yes  . Essential hypertension 08/17/2015 Yes  . Mixed hyperlipidemia 08/17/2015 Yes    Resolved Hospital Problems  No resolved problems to display.    Current Discharge Medication List    DISCONTINUED medications     VANCOMYCIN  HCL IV       NEW medications   Details  oxyCODONE  HCl (ROXICODONE ) 5 mg immediate release tablet Take one tablet (5 mg dose) by mouth every 6 (six) hours as needed for Pain for up to 7 days. Max Daily Amount: 20 mg Start date: 06/05/2021, End date: 06/12/2021    traMADol (ULTRAM) 50 mg tablet Take one tablet (50 mg dose) by mouth every 6 (six) hours as needed for Pain for up to 7 days. Max Daily Amount: 200 mg Start date: 06/05/2021, End date: 06/12/2021      CONTINUED medications   Details  acetaminophen  (TYLENOL ) 500 mg tablet Take one tablet (500 mg dose) by mouth every 6 (six) hours as needed for Pain.    apixaban  (ELIQUIS ) 5 mg tablet Take  one tablet (5 mg dose) by mouth 2 (two) times daily.    atorvastatin  (LIPITOR) 20 mg tablet Take one tablet (20 mg dose) by mouth at bedtime.    Calcium  Carbonate-Vit D-Min (CALCIUM  1200 PO) Take 1 tablet by mouth daily.    metoprolol  tartrate (LOPRESSOR ) 25 mg tablet Take one half tablet (12.5 mg dose) by mouth 2 (two) times daily.    Multiple Vitamin (MULTIVITAMIN) tablet Take one tablet by mouth daily.    Multiple Vitamins-Minerals (PRESERVISION AREDS 2 PO) Take 1 tablet by mouth 2 (two) times daily.    omeprazole (PRILOSEC) 20 mg capsule Take one capsule (20 mg dose) by mouth daily.       1. Paroxysmal atrial fibrillation (*)   2. Chronic diastolic (congestive) heart failure (*)   3. Obesity (BMI 30-39.9)   4. Infected prosthetic knee joint, initial encounter (*)   5. Methicillin resistant Staphylococcus epidermidis infection   6. Chronic obstructive pulmonary disease, unspecified COPD type (*)   7. Essential hypertension   8. Gastroesophageal reflux disease, unspecified whether esophagitis present   9. Mixed hyperlipidemia   10. Status post replacement of knee joint   11. Bilateral lower extremity edema   12. Anemia of infection   13. Atherosclerosis of native coronary artery of native heart without angina pectoris   14. Gastroesophageal reflux disease without esophagitis   15. Open wound of right knee,  subsequent encounter   16. Gross hematuria    Discharge Procedure Orders  Drain care  Order Comments: Empty every 8 hours.   Hospital Course  Physicians involved in care during this hospitalization Attending Provider: Francis DELENA Dad, MD Admitting Provider: Francis DELENA Dad, MD Consulting Physician: Tobias JINNY Naegeli, MD Consulting Physician: Franky CHRISTELLA Awkward, MD Consulting Physician: Susette Pond, MD Consulting Physician: Haywood POUR. 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  They were taken to the operating room where they were prepped and draped in the normal fashion and static spacer was placed was completed without any complications. Jun 01, 2021 gastroc flap performed by Dr Jackson.   Post operatively they were maintained on lovenox  for DVT prophylaxis. They progressed nicely with ID and medicine following. NWB in [facility] Dr Jackson recommends f/u for staple removal the week of Dec 27th.  IV abx per ID.  Resume home anticoagulation.  F/u Dr Francis Dad in 1 mo if appointment not already arranged , contact office [phone].   Post Hospital Care   No results found for this or any previous visit (from the past 24 hour(s)).   Code Status: No Order  Follow up with Dr. Francis Dad in 3-4 weeks at the [facility]  Electronically signed: Delon FORBES Kaska, PA-C 06/06/2021 / 6:38 AM

## 2021-10-01 NOTE — H&P (View-Only) (Signed)
 HISTORY AND PHYSICAL - PREOPERATIVE EVALUATION/CONSULTATION  CHIEF COMPLAINT Primary Care Physician: Linus Funk Date of Surgery: 10/15/21 Surgeon: MARLA Dad Procedure: Rt TKA Revision Reaction to Anesthesia: None OSA: No CPAP: No Hx of DM: No  HISTORY OF PRESENT ILLNESS  71 y.o. male Presents to clinic today prior to undergoing a right total knee arthroplasty revision with Dr. Dad on 10/15/2021.  Right knee was replaced initially on January 07, 2021 at [place].  It was subsequently infected and went under surgery on 05/28/2021 for cleaning out with Dr. Dad and flap placement with Dr. Jackson.  He completed IV antibiotics for approximately 2 weeks and with a clean culture now.  Surgery was done at [facility].  Presents back to clinic today for a revision of the knee with Dr. Dad.  He has had no issues with his atrial fibrillation and has not felt any palpitations.   Patient reports feeling well without new complaints.  Patient denies symptoms of shortness of breath, chest pain, palpitations, dizziness, headache, abdominal pain, nausea vomiting, diarrhea, urinary complaints, fever, chills or cough.  Patient denies any history of heart attack, stroke or congestive heart failure.  Patient denies any chest pain, shortness of breath, orthopnea, PND or palpitation. Patient denies any weakness, numbness, dizziness or syncope. Patient denies any fever, chills, cough, body aches or headache.  Patient's Duke activity score correlates to 3.63 METs    Past medical history: Atrial fibrillation on Eliquis , hyperlipidemia Past surgical history: Right knee replacement, status post right knee resection of revision knee with placement of static spacer and extensor mechanism disruption, flap flap of the right leg, Nose surgery for malignancy, right knee surgery at 71 yo Social history: Occupation: Retired Tobacco Use: No ETOH:  3-4 drinks/week Drug Use: No   Labs: Labs will be  obtained in clinic today  EKG: EKG from 05/27/2021 demonstrates sinus rhythm with some PVCs and a ventricular rate of 93  Anesthesia/Surgical Complications in the past: None   Hospitalizations in the last year:  Right TKA in July 2022 and subsequent washout and flap in December 2022  Patient's wife present during this visit   ROS: 10 point review of system negative except as per HPI   Current Outpatient Medications on File Prior to Visit  Medication Sig Dispense Refill  . acetaminophen  (TYLENOL ) 500 mg tablet Take 1,000 mg by mouth 2 (two) times a day as needed for mild pain (1-3) or moderate pain (4-6).    . apixaban  (ELIQUIS ) 5 mg tab Take 5 mg by mouth 2 (two) times a day.    . atorvastatin  (LIPITOR) 20 mg tablet Take 20 mg by mouth every evening.    . calcium  carbonate-vitamin D3 600 mg-12.5 mcg (500 unit) cap Take 1 capsule by mouth every morning.    . methocarbamoL  (ROBAXIN ) 500 mg tablet Take 500 mg by mouth every 12 (twelve) hours as needed for muscle spasms.    . metoprolol  tartrate (LOPRESSOR ) 25 mg tablet Take 12.5 mg by mouth 2 (two) times a day as needed (for SBP>110).    SABRA omeprazole (PriLOSEC) 20 mg DR capsule Take 20 mg by mouth every evening.    . potassium chloride  (KLOR-CON ) 20 mEq ER tablet Take 20 mEq by mouth every evening.    . vit C,E-Zn-coppr-lutein-zeaxan (PreserVision AREDS-2) 250-90-40-1 mg cap capsule Take 1 capsule by mouth 2 (two) times a day.     No current facility-administered medications on file prior to visit.      VITALS BP- 115/66  HR- 54 RR- 20 Spo2- 96% RA Temp- 97.8 Wt: Unable to weigh Ht:-6 ft 4 in   PHYSICAL EXAM GEN: NAD, alert and oriented x 3 HEENT: moist oral mucosa, no cervical lymphadenopathy, EOMI NECK: No JVD RESP: CTAB, no wheeze, no rales CARD: regular rate, normal rhythm, no murmurs, no lower extremity edema ABD: soft, nt/nd, BS+ EXT: no lower extremity edema or cyanosis  INTEG: No rash, bruises or open lesions, warm  and dry NEURO: no focal neurological deficits   ASSESSMENT/PLAN Problem List Items Addressed This Visit      Cardiovascular and Mediastinum   Atrial fibrillation (CMS/HCC)  Other Visit Diagnoses    Pre-op evaluation    -  Primary   Relevant Orders   CBC with Differential   Comprehensive Metabolic Panel   Hyperlipidemia, unspecified hyperlipidemia type           Preoperative Evaluation - Patient does not exhibit any signs or symptomatology that concerns me for decompensated congestive heart failure, fatal arrhythmia or unstable angina.  Patient's Duke activity score correlates to 3.63 METs.    Lab work will be obtained in clinic today.  I have asked the patient to stop any herbal supplements, OTC supplements and NSAIDs one week before surgery.  At present, the patient is optimized and may proceed to surgery without delay as low risk for cardiopulmonary complications. Revised cardiac risk index of 0 with a 3.9% 30-day risk of death, MI or cardiac arrest.  Paroxysmal atrial fibrillation -Currently normal sinus rhythm on exam -Hold Eliquis  3 days prior to surgery -Continue metoprolol  including on the morning of surgery  Hyperlipidemia -Continue statin  GERD -Continue PPI    Medication management:  - I have reviewed the all medication instructions with patient in detail and provided written instruction.  Medications to hold 7 days prior surgery: ASA, all NSAIDs, all Vitamins (except vitamin D), all nutritional and herbal supplements, Fish oil/CoQ 10/Omega 3.  Medications to hold 3 days prior to surgery: - Eliquis   Medications to take on the morning of surgery:  - Metoprolol   Medications to hold on day of surgery:   Consult [facility] Hospitalist for inpatient management of medical conditions.    Thank you Dr. Licia for allowing [facility] to participate in the care of this patient. I have provided the patient with detailed instructions regarding medication timing preoperatively. All  questions were answered.  ------------------------------------------ ADDENDUM:  10/02/21 Labs reviewed and results are unremarkable.  His Alk Phos is mildly elevated and can be rechecked as an outpatient in 3 months.   Patient may proceed with surgery as previously scheduled.  Results    Procedure Component Value Units Date/Time   CBC with Differential [[id number]]  (Abnormal) Collected: 10/01/21 0827   Lab Status: Final result Specimen: Blood, Venous Updated: 10/01/21 1353   Narrative:     The following orders were created for panel order CBC with Differential. Procedure                               Abnormality         Status                    ---------                               -----------         ------  CBC with Differential[[id number]]        Abnormal            Final result               Please view results for these tests on the individual orders.   Comprehensive Metabolic Panel [[id number]]  (Abnormal) Collected: 10/01/21 0827   Lab Status: Final result Specimen: Blood, Venous Updated: 10/01/21 1419    Sodium 138 mmol/L     Potassium 4.1 mmol/L     Chloride 105 mmol/L     CO2 24 mmol/L     Anion Gap 9 mmol/L     Glucose, Random 94 mg/dL     Blood Urea Nitrogen (BUN) 17 mg/dL     Creatinine 9.09 mg/dL     eGFR >09 fO/fpw/8.26f7     Albumin  4.0 g/dL     Total Protein 7.2 g/dL     Bilirubin, Total 0.9 mg/dL     Alkaline Phosphatase (ALP) 122 U/L     Aspartate Aminotransferase (AST) 17 U/L     Alanine Aminotransferase (ALT) 13 U/L     Calcium  9.4 mg/dL     BUN/Creatinine Ratio --   CBC with Differential [[id number]]  (Abnormal) Collected: 10/01/21 0827   Lab Status: Final result Specimen: Blood, Venous Updated: 10/01/21 1353    WBC 11.34 10*3/uL     RBC 5.05 10*6/uL     Hemoglobin 10.6 g/dL     Hematocrit 35 %     Mean Corpuscular Volume (MCV) 70 fL     Mean Corpuscular Hemoglobin (MCH) 21 pg     Mean Corpuscular Hemoglobin Conc (MCHC) 30 g/dL      Red Cell Distribution Width (RDW) 21.5 %     Platelet Count (PLT) 272 10*3/uL     Mean Platelet Volume (MPV) 9.2 fL     Neutrophils % 74 %     Lymphocytes % 17 %     Monocytes % 7 %     Eosinophils % 1 %     Basophils % 0 %     nRBC % 0 %     Neutrophils Absolute 8.40 10*3/uL     Lymphocytes # 1.90 10*3/uL     Monocytes # 0.80 10*3/uL     Eosinophils # 0.10 10*3/uL     Basophils # 0.00 10*3/uL     nRBC Absolute 0.00 10*3/uL

## 2021-10-15 NOTE — Discharge Summary (Signed)
 CC: R knee pji chronic Date of Admission: 10/13/2021 Date of Discharge: 5/3//23  HPI:  Patient is a pleasant 71 y.o. male admitted due to infected total knee arthroplasty  knee refractory to prolonged conservative treatment. he opted to proceed with Reimplant Right TKA with gastroc flap and marlex mesh. following extensive discussion of potential risks, benefits, and alternatives. Written informed consent was obtained. All questions were answered.   Hospital Course:  The patient was admitted to the orthopaedic surgery service  and taken to the operating room, where he underwent a Revision r  total knee arthroplasty. For a complete description of the operation, please refer to the dedicated operative note. Patient was transferred in stable condition. Patient was placed on mechanical compression devices with foot pumps for DVT prophylaxis, along with an aspirin  regimen. While on the floor, patient's pain was easily controlled with oral pain meds. Patient's diet was easily advanced. Patient remained medically stable throughout hospitalization. Patient was discharged home in stable condition following clearance by PT/OT.   Discharge Plan:  Long Leg cast until followup In office Resume home Eliquis  WBAT Return to clinic for follow up in office in 4wks. Call [phone] with questions.        Medication List     START taking these medications    cephALEXin 500 mg capsule Commonly known as: KEFLEX Take 1 capsule (500 mg total) by mouth 2 (two) times a day.   oxyCODONE  5 mg immediate release tablet Commonly known as: ROXICODONE  Take 1 tablet (5 mg total) by mouth every 6 (six) hours as needed for moderate pain (4-6) for up to 10 days.   traMADoL 50 mg tablet Commonly known as: ULTRAM Take 1 tablet (50 mg total) by mouth every 6 (six) hours as needed for moderate pain (4-6) for up to 10 days.       CONTINUE taking these medications    acetaminophen  500 mg tablet Commonly known as:  TYLENOL    apixaban  5 mg Tab Commonly known as: ELIQUIS    atorvastatin  20 mg tablet Commonly known as: LIPITOR   calcium  carbonate-vitamin D3 600 mg-12.5 mcg (500 unit) Cap   methocarbamoL  500 mg tablet Commonly known as: ROBAXIN    metoprolol  tartrate 25 mg tablet Commonly known as: LOPRESSOR    omeprazole 20 mg DR capsule Commonly known as: PriLOSEC   potassium chloride  20 mEq ER tablet Commonly known as: KLOR-CON    PreserVision AREDS-2 250-90-40-1 mg Cap capsule Generic drug: vit C,E-Zn-coppr-lutein-zeaxan         Where to Get Your Medications     You can get these medications from any pharmacy   Bring a paper prescription for each of these medications cephALEXin 500 mg capsule oxyCODONE  5 mg immediate release tablet traMADoL 50 mg tablet

## 2021-10-15 NOTE — Interval H&P Note (Signed)
 H&P reviewed. The patient was examined and there are no changes to the H&P.

## 2022-02-01 NOTE — Telephone Encounter (Signed)
error 

## 2022-06-24 NOTE — Discharge Summary (Signed)
 [facility] Hospitalist Discharge Summary  Identifying Information:  Harry [name] [birth year] [id number]  Admit date: 06/19/2022  Discharge date: 06/24/2022   Discharge Service: [facility] Hospitalist  Discharge Attending Physician:Nsiah, Erica KIDD, MD  Discharge to: Home  Discharge Diagnoses: Principal Problem (Resolved):   Bilateral lower extremity edema Active Problems:   * No active hospital problems. 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 Patient is debilitated due to multiple right knee surgeries with complications, has Unna boots in the lower extremities and home health nurse changed when reports noticed a blister on the left leg. Patient wife is present. She manages his meds. He's on keflex 250 bid, eliquis , lopressor  12.5 bid as needed if SBP >110.He has not seen a cardiologist in past 1 and half years, has not seen a provider recently. Patient had right knee surgeries back in 2020, complicated by one of the ligament tear with repeat surgery and repair. 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 Patient thinks he has heart failure he is on Lasix 20 mg twice daily. During this admission patient was found to have bilateral lower extremity swelling with blisters likely secondary to acute congestive heart failure with component of right heart failure.  Patient was diuresed with IV Lasix during this admission fluid was restricted to 1500 cc or less in 24 hours.  Patient's lower extremity edema did improve.  He did have venous stasis ulcers that was being cared for by wound care team.  With Vashe was seen and then topical antibiotic cover and then ABD pad with Kerlix dressing every Tuesdays and Fridays.  With bandaging.  Echocardiogram that was done  showed EF of 25 to 35% with global hypokinesis. I discussed this finding with patient's spouse who was aware of this.  And was planning to follow-up with cardiology in the outpatient setting. Goal directed medical therapy with metoprolol  25 mg p.o. twice daily.  No other therapy could be added due to patient's hypotension. Patient also noted to have atrial fibrillation with RVR the morning of 06/22/2022 which was treated and managed with Lopressor  25 mg p.o. twice daily. Patient was also found to have iron deficiency anemia with a hemoglobin dropped to 6.9 during this admission requiring 1 unit of packed red cell transfusion at discharge patient's hemoglobin was 7.8 was advised to take daily iron pills. 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  He is noted to be currently wheelchair-bound Patient during this admission prior to discharge was noted to be confused and spouse was upset and wanted patient to be discharged back to his home environment while he usually functions normally and does not usually get confused in this environment.      ,               Post Discharge Follow Up Issues:  Follow-up with cardiology for congestive heart failure with low EF of 25 to 30%. Follow-up with PCP in a week Continue iron tablet 3 2 5  mg p.o. daily for iron deficiency anemia   Procedures: None No admission procedures for hospital encounter. _____________________________________________________________________________ Discharge Day Services: BP 115/72   Pulse 97   Temp 98.2 F (36.8 C) (Oral)   Resp 19  Ht 1.93 m (6' 4)   Wt (!) 143.4 kg (316 lb 2.2 oz)   SpO2 98%   BMI 38.48 kg/m  Pt seen on the day of discharge and determined appropriate for discharge. GEN:  lying in bed EYES: EOMI ENT: MMM CV: RRR PULM: CTA B ABD: soft, NT/ND, +BS EXT: No edema    Condition at  Discharge: good  Length of Discharge: I spent 32 mins in the discharge of this patient. _____________________________________________________________________________ Discharge Medications: Patient Instructions:    .    START taking these medications   ferrous sulfate 325 (65 FE) MG tablet Commonly known as: FERATAB Dose: 325 mg Instructions: Take 1 tablet (325 mg total) by mouth daily with breakfast for 30 days.     CONTINUE taking these medications   acetaminophen  325 MG tablet Commonly known as: TYLENOL  Dose: 650 mg Instructions: Take 2 tablets (650 mg total) by mouth every 6 (six) hours as needed.   atorvastatin  20 MG tablet Commonly known as: LIPITOR Instructions: TAKE 1 TABLET BY MOUTH  DAILY Comment: Requesting 1 year supply   cephalexin 250 MG capsule Commonly known as: KEFLEX Dose: 250 mg Instructions: Take 1 capsule (250 mg total) by mouth 2 times daily.   Eliquis  5 mg tablet *ANTICOAGULANT* Dose: 5 mg Instructions: Take 1 tablet (5 mg total) by mouth 2 times daily. Generic drug: apixaban    furosemide 20 MG tablet Commonly known as: LASIX Dose: 40 mg Instructions: Take 2 tablets (40 mg total) by mouth daily.   Lucentis 0.3 mg/0.05 mL Solution ophthalmic injection Dose: 0.3 mg Instructions: 0.05 mLs (0.3 mg total) by Intravitreal route once. Generic drug: ranibizumab   methocarbamoL  500 MG tablet Commonly known as: ROBAXIN  Dose: 500 mg Instructions: Take 1 tablet (500 mg total) by mouth 2 times daily.   metoPROLOL  succinate 25 MG 24 hr tablet Commonly known as: TOPROL -XL Dose: 12.5 mg Instructions: Take 0.5 tablets (12.5 mg total) by mouth 2 (two)  times daily as needed.   omeprazole 20 MG DR capsule Commonly known as: PriLOSEC Instructions: TAKE 1 CAPSULE BY MOUTH  DAILY Comment: Requesting 1 year supply   PRESERVISION AREDS-2 ORAL Dose: 1 Dose Instructions: Take 1 Dose by mouth 2 times daily.   traMADoL 50 mg tablet Commonly known as:  ULTRAM Dose: 50 mg Instructions: Take 1 tablet (50 mg total) by mouth every 8 (eight) hours as needed. Takes atleast twice a day     STOP taking these medications   EYE VITAMIN AND MINERALS ORAL   lisinopriL -hydrochlorothiazide  20-12.5 mg per tablet Commonly known as: PRINZIDE ,ZESTORETIC    naproxen 500 MG tablet Commonly known as: NAPROSYN   Vitamin D3 125 mcg (5,000 unit) Generic drug: cholecalciferol   Vitamins and Minerals Tab Generic drug: multivitamin,tx-minerals        _____________________________________________________________________________ Pending Test Results (if blank, then none): @PRINTGROUPPENDLAB @  Most Recent Labs:  Lab Results  Component Value Date/Time   WBC 8.6 06/23/2022 1025   RBC 4.39 (L) 06/23/2022 1025   HGB 7.8 (L) 06/23/2022 1025   HCT 26.8 (L) 06/23/2022 1025   PLT 279 06/23/2022 1025    Lab Results  Component Value Date   WBC 8.6 06/23/2022   HGB 7.8 (L) 06/23/2022   HCT 26.8 (L) 06/23/2022   PLT 279 06/23/2022    Lab Results  Component Value Date   CO2 27 06/23/2022   BUN 19 06/23/2022   CREATININE 0.95 06/23/2022   CALCIUM  8.1 (L) 06/23/2022   ALBUMIN  3.2 (L) 06/23/2022  AST 22 06/23/2022   ALT 22 06/23/2022    Lab Results  Component Value Date   NA 139 06/23/2022   K 3.5 06/23/2022   CL 104 06/23/2022   CO2 27 06/23/2022   BUN 19 06/23/2022   CREATININE 0.95 06/23/2022   CALCIUM  8.1 (L) 06/23/2022   MG 2.0 06/19/2022    Lab Results  Component Value Date   ALKPHOS 116 06/23/2022   BILITOT 1.7 (H) 06/23/2022   PROT 6.2 06/23/2022   ALBUMIN  3.2 (L) 06/23/2022   ALT 22 06/23/2022   AST 22 06/23/2022    Lab Results  Component Value Date   INR 1.28 10/26/2021   Hospital Radiology:  CUS TTE SURFACE ECHO ADULT W CONTRAST  Result Date: 06/23/2022                                                   [facility]                                                 [facility]                                                  [facility]                                                  Cardiac                                                Ultrasound-                                                [place],                                                   [facility]                                                 [address]                                                 [place]                                                  [place] [address]                       Transthoracic Echocardiogram Report Name: SHADMAN, TOZZI               Study Date: 06/23/2022           Height: 76 in MRN: [id number]                         Patient Location: [facility]        Weight: 318 lb DOB: 18-Apr-1951                      Gender: Male                     BSA: 2.7 m2 Age: 34 yrs                          Ethnicity: W                     BP: 94/64 mmHg Reason For Study: dyspnea; Bilateral lower extremity edema Ordering Physician: SATIRA EMERY RAMAN                                    Performed By: ETHEL DIXON KNOPF Referring Physician: SELF, A REFERRAL - - PROCEDURE A complete two-dimensional transthoracic echocardiogram was performed (2D, M- mode, spectral and color flow Doppler). Image Quality: Poor. A injection of Definity  contrast agent was performed to improve image quality. poor image quality due to body habitus and COPD. - SUMMARY There is moderate to severe global hypokinesis of the left ventricle. Unable to fully assess LV regional wall motion LV ejection fraction = 25-30%. The left ventricle is borderline dilated. The right ventricle is moderately dilated. The left atrium is severely dilated. The right atrium is moderately dilated. There is mild mitral regurgitation. There is mild tricuspid regurgitation. No pulmonary  hypertension. There is no pericardial effusion. IVC size was moderately dilated. There is no comparison study available. - FINDINGS: LEFT VENTRICLE The left ventricle is borderline dilated. There is normal left ventricular wall thickness. Left ventricular  systolic function is severely reduced. LV ejection fraction = 25-30%. Unable to fully assess LV regional wall motion. There is moderate to severe global hypokinesis of the left ventricle. - RIGHT VENTRICLE The right ventricle is moderately dilated. LEFT ATRIUM The left atrium is severely dilated. RIGHT ATRIUM The right atrium is moderately dilated. - AORTIC VALVE The aortic valve is not well visualized. There is no aortic stenosis. There is no aortic regurgitation. - MITRAL VALVE The mitral valve leaflets appear normal. There is mild mitral regurgitation. - TRICUSPID VALVE The tricuspid valve is not well visualized. There is mild tricuspid regurgitation. Estimated right ventricular systolic pressure is 28 mmHg. No pulmonary hypertension. - PULMONIC VALVE The pulmonic valve is not well visualized. - ARTERIES The aortic sinus is normal size. - VENOUS IVC size was moderately dilated. Right atrial pressure is estimated to be 8 mm Hg. - EFFUSION There is no pericardial effusion. - - MMode/2D Measurements & Calculations IVSd: 0.89 cm     EDV(MOD-sp4):   ESV(MOD-sp4):         LVOT diam: 2.4 cm LVIDd: 5.4 cm     257.0 ml        204.0 ml LVPWd: 1.2 cm LVIDs: 4.5 cm         _______________________________________________________________ SV(MOD-sp4):      EF A4C: 20.6 %  LA ESV Index (A2C):   LA ESV Index (A4C): 53.0 ml                           30.7 ml/m2            49.6 ml/m2 SI(MOD-sp4): 19.6 ml/m2         _______________________________________________________________ SV A4C: 53.0 ml Time Measurements MM R-R int: 0.63 sec Doppler Measurements & Calculations MV E max vel:       MV dec time:   SV(LVOT): 62.5 ml        LV V1 VTI: 14.4 cm 115.0 cm/sec        0.13 sec       Ao  V2 max: 114.0 cm/sec Med Peak E' Vel:                   Ao max PG: 5.2 mmHg 7.8 cm/sec                         Ao V2 mean: 84.1 cm/sec Lat Peak E' Vel:                   Ao mean PG: 3.0 mmHg 12.6 cm/sec                        Ao V2 VTI: 20.9 cm E/Lat E`: 9.1 E/Med E`: 14.7                     AVA (VTI): 3.0 cm2         _______________________________________________________________ TR max vel:         RAP systole:   AS Dimensionless Index   AVAi(VTI) 223.3 cm/sec        8.0 mmHg       (VTI): 0.69              cm^2/m^2: 1.1 cm2 TR max PG: 19.9 mmHg RVSP(TR): 27.9 mmHg         _______________________________________________________________ SV index(LVOT): 23.1 ml/m2 ______________________________________________________________________________ Reading Physician:  [place], [id number] 06/23/2022 12:44 PM   CTA CHEST (PE STUDY) W CONTRAST  Result Date: 06/19/2022 CLINICAL DATA:  Lower extremity swelling. EXAM: CT ANGIOGRAPHY CHEST WITH CONTRAST TECHNIQUE: Multidetector CT imaging of the chest was performed using the standard protocol during bolus administration of intravenous contrast. Multiplanar CT image reconstructions and MIPs were obtained to evaluate the vascular anatomy. RADIATION DOSE REDUCTION: This exam was performed according to the departmental dose-optimization program which includes automated exposure control, adjustment of the mA and/or kV according to patient size and/or use of iterative reconstruction technique. CONTRAST:  80 mL of Omnipaque  350 COMPARISON:  March 12, 2021 FINDINGS: Cardiovascular: There is mild calcification of the aortic arch, without evidence of aortic aneurysm. Satisfactory opacification of the pulmonary arteries to the segmental level. No evidence of pulmonary embolism. Normal heart size with marked severity coronary artery calcification. No pericardial effusion. Mediastinum/Nodes: No enlarged mediastinal, hilar, or axillary lymph nodes.  Thyroid gland, trachea, and esophagus demonstrate no significant findings. Lungs/Pleura: Mild atelectasis is seen within the bilateral lung bases. There are small bilateral pleural effusions. No pneumothorax is identified. Upper Abdomen: There is a small hiatal hernia. Multiple predominant stable cystic appearing lesions are seen throughout the right and left lobes of the liver. A 4.3 cm diameter simple cyst is also seen within the anterior aspect of the mid right kidney. Musculoskeletal: Multilevel degenerative changes seen throughout the thoracic spine. Review of the MIP images confirms the above findings. IMPRESSION: 1. No evidence of pulmonary embolism. 2. Mild bibasilar atelectasis. 3. Small bilateral pleural effusions. 4. Small hiatal hernia. 5. Multiple stable hepatic and renal cysts. No follow-up imaging is recommended. This recommendation follows ACR consensus guidelines: Management of the Incidental Renal Mass on CT: A White Paper of the ACR Incidental Findings Committee. J Am Coll Radiol 2018;15:264-273. 6. Aortic atherosclerosis. Aortic Atherosclerosis (ICD10-I70.0). Electronically Signed   By: Suzen Dials M.D.   On: 06/19/2022 19:41   XR CHEST AP  PORTABLE  Result Date: 06/19/2022 CLINICAL DATA:  Shortness of breath EXAM: PORTABLE CHEST 1 VIEW COMPARISON:  Prior CTs FINDINGS: UPPER limits normal heart size noted. There is no evidence of focal airspace disease, pulmonary edema, suspicious pulmonary nodule/mass, pleural effusion, or pneumothorax. No acute bony abnormalities are identified. IMPRESSION: No active disease. Electronically Signed   By: Reyes Phi M.D.   On: 06/19/2022 19:02    _____________________________________________________________________________ Discharge Instructions:   Discharge Orders and Instructions    Home Health Skilled Nurse - Education on Disease Process   Complete by: As directed    Length of Need: 99/Lifetime   Physical Therapy Prescription for Home  Health   Complete by: As directed    Action(s):  Evaluate and Treat Home Safety Evaluation     Length of Need: 99/Lifetime   Occupational Prescription for Home Health   Complete by: As directed    Action(s):  Evaluate and Treat Home Safety Evaluation     Length of Need: 99/Lifetime   Home Health Skilled Nurse - Wound Care   Complete by: As directed    Freq: Tuesday/Friday   Instructions: See Comments   Additional Options:  Dressing Changes and Education on Wound Care Education on Wound Care Monitoring for Healing Other (Enter Comments Below)     Education on Wound Care?: Yes   Monitoring for Healing?: Yes   Length of Need: 99/Lifetime   Recommendations: 1. Cleanse BLE wounds with vashe 2. Apply Aquacel ag to manage drainage and provide topical antimicrobial cover  with abd pad secure with kerlex from base of toes to knee and coban in same fashion change every Tuesday and Friday. Continue with mepilex border heel dsgs change with compression dsg changes       Diet At Discharge   Complete by: As directed    Recommended diet at discharge: Cardiac diet   Activity At Discharge   Complete by: As directed    Recommended activity at discharge: Activity as tolerated             Electronically signed by: Michalene Erica KIDD, MD 06/24/22 1625

## 2023-01-20 IMAGING — CT CT ANGIO CHEST
2 of 7 series · 17 of 46 positions shown · IV contrast (APPLIED)
Comparison: 05/22/2018

CLINICAL DATA: Recent knee surgery, complicated by surgical
revision, acute hypotension, atrial fibrillation, concern for acute
PE

EXAM:
CT ANGIOGRAPHY CHEST WITH CONTRAST
TECHNIQUE: Multidetector CT imaging of the chest was performed using the
standard protocol during bolus administration of intravenous
contrast. Multiplanar CT image reconstructions and MIPs were
obtained to evaluate the vascular anatomy.
CONTRAST:  80mL OMNIPAQUE IOHEXOL 350 MG/ML SOLN

[Series 5: thins · axial · 0.90mm/px · z∈[+1386,+1656]mm · 15 of 310 slices shown]
[im 20/310  lung]
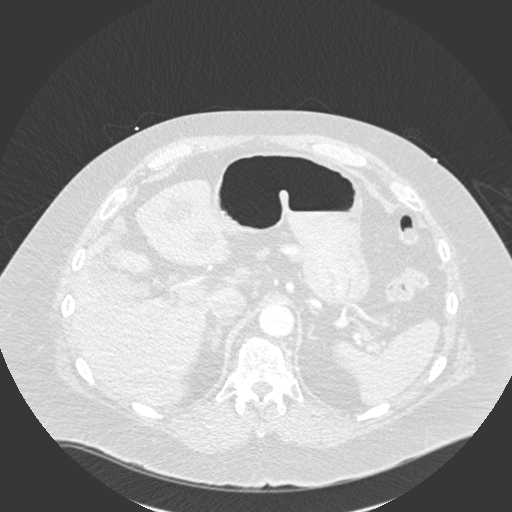
[im 39/310  soft-tissue]
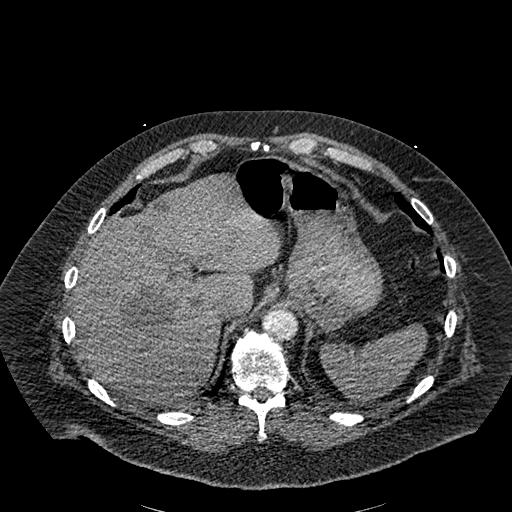
[im 58/310  lung]
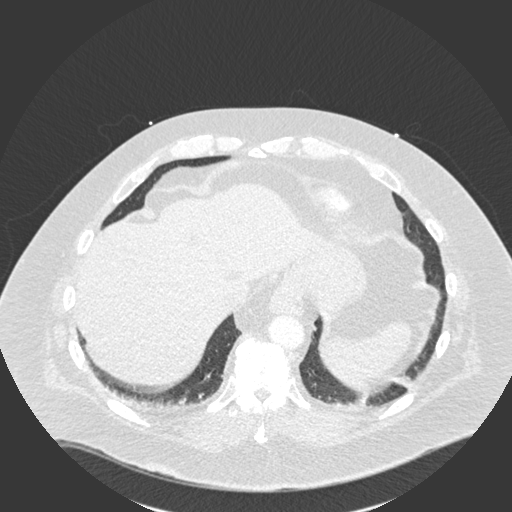
[im 78/310  soft-tissue]
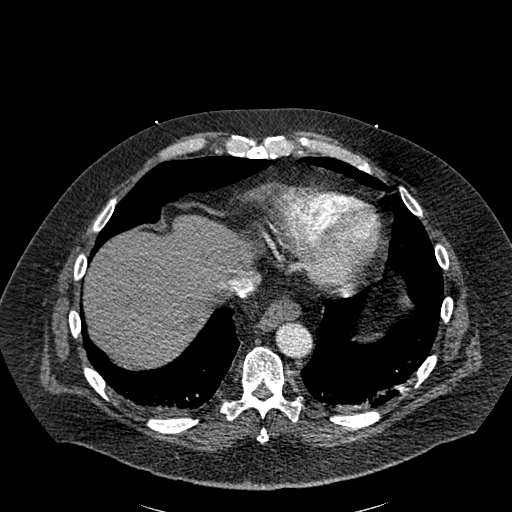
[im 97/310  lung]
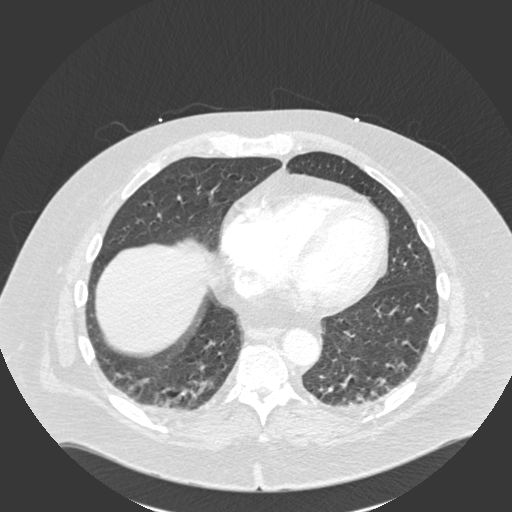
[im 116/310  soft-tissue]
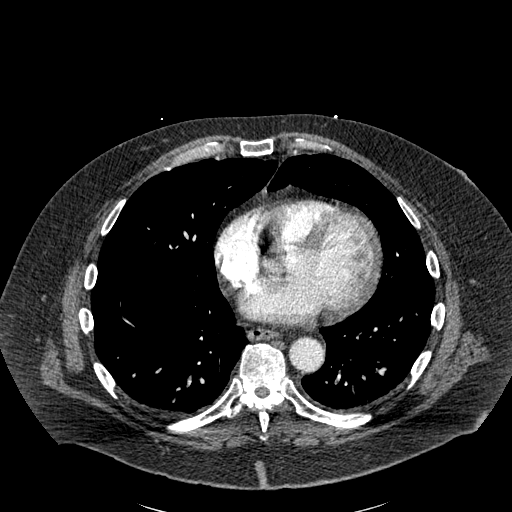
[im 136/310  lung]
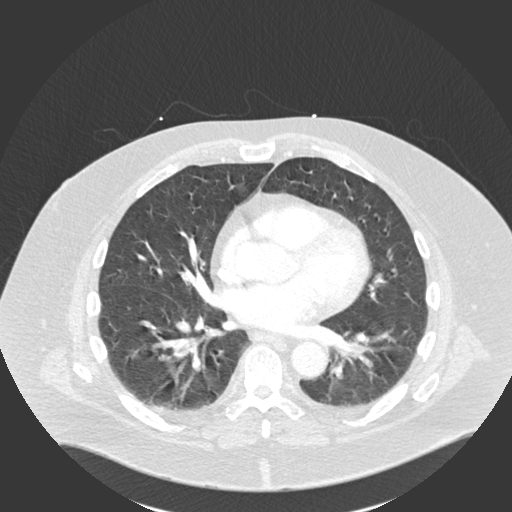
[im 155/310  soft-tissue]
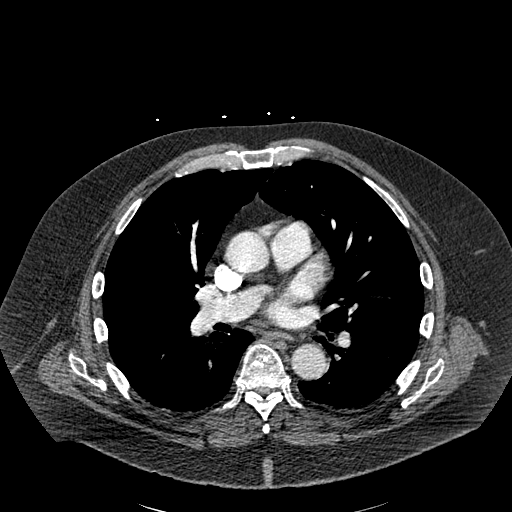
[im 174/310  lung]
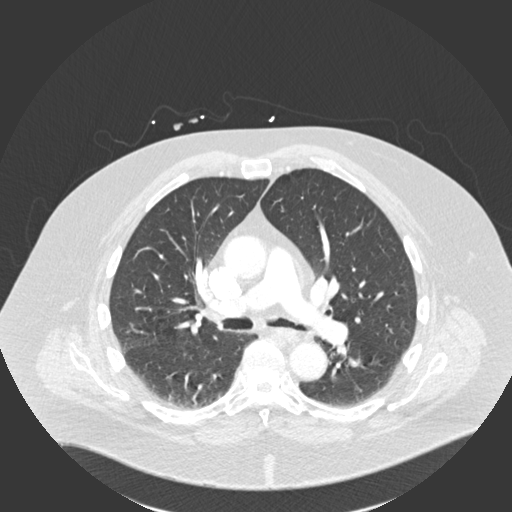
[im 194/310  soft-tissue]
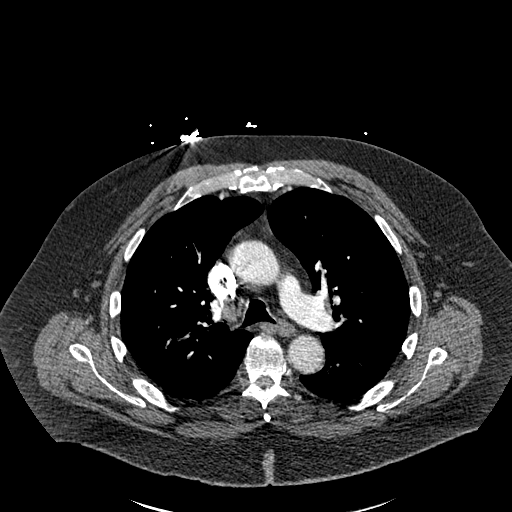
[im 213/310  lung]
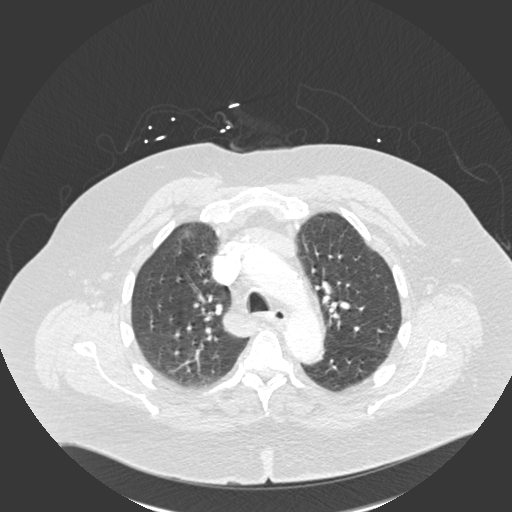
[im 232/310  soft-tissue]
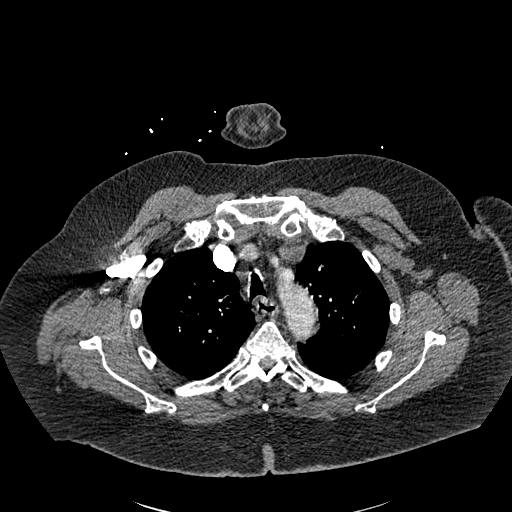
[im 252/310  lung]
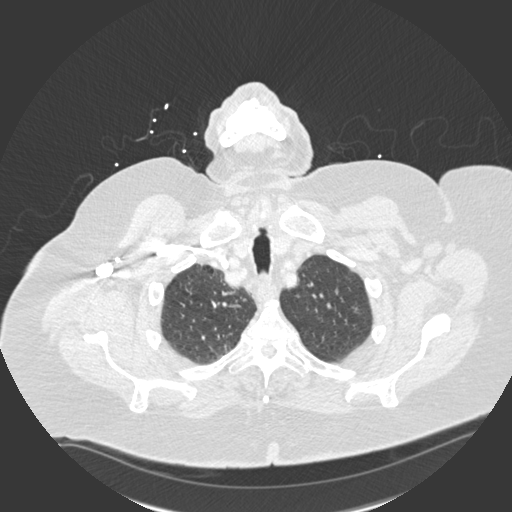
[im 271/310  soft-tissue]
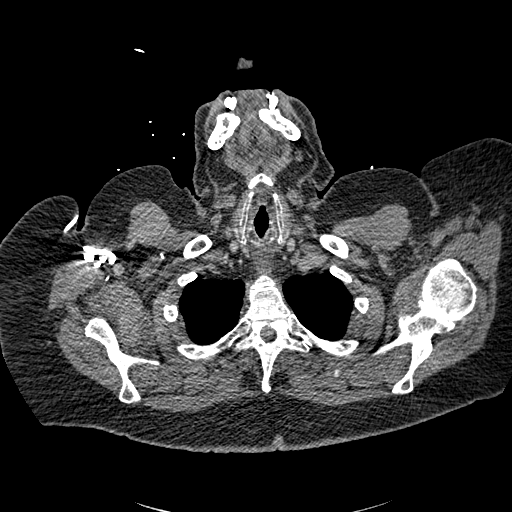
[im 290/310  lung]
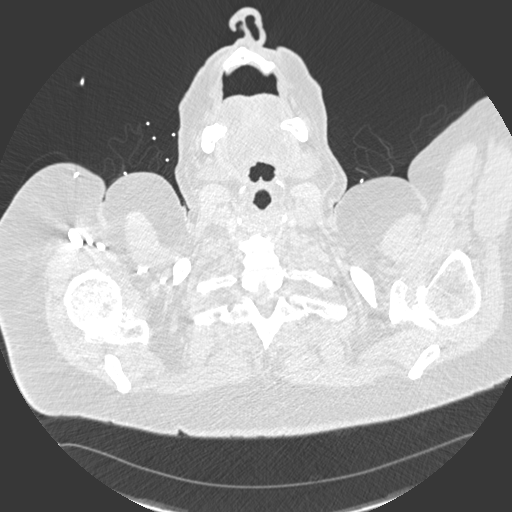

[Series 7: coronal mpr · coronal · 0.62mm/px · 2 of 111 slices shown]
[im 37/111  soft-tissue]
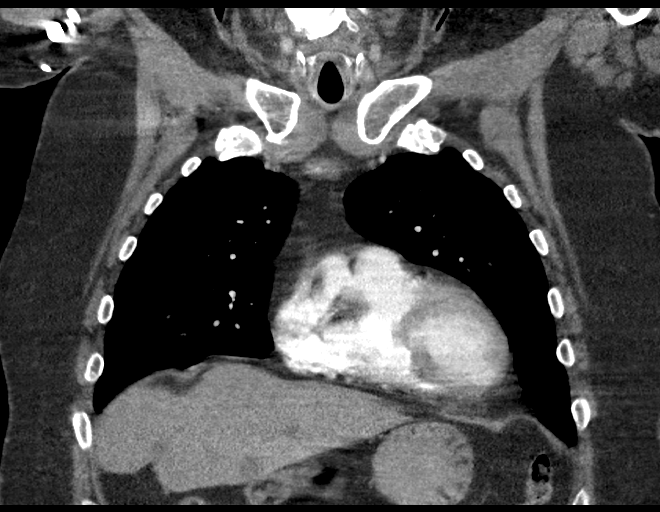
[im 74/111  soft-tissue]
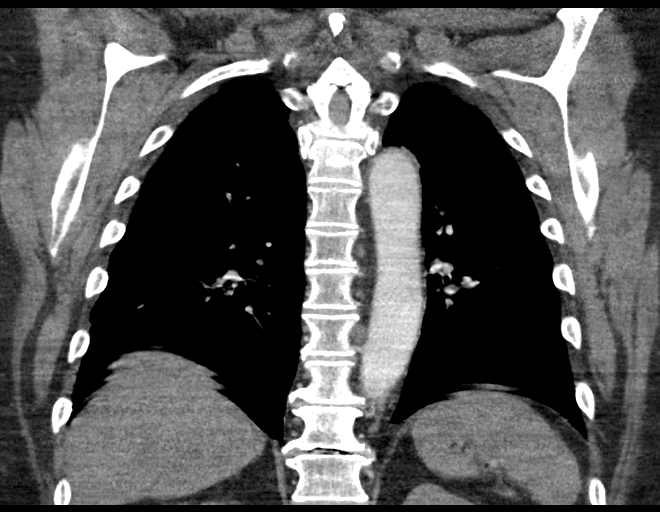

[17 of 46 positions shown; findings below may reference images not displayed]

FINDINGS: Cardiovascular: Central pulmonary arteries are normal in caliber and
patent. No significant central, saddle, or proximal hilar acute
significant pulmonary embolus. Limited assessment of the peripheral
segmental branches related to motion artifact and peripheral
contrast opacification.

Thoracic aortic atherosclerosis without aneurysm or dissection. No
acute aortic process. Patent 3 vessel arch anatomy. No mediastinal
hemorrhage or hematoma.

Native coronary atherosclerosis. Normal heart size. No pericardial
effusion.

Central venous structures are patent.  No Zonia process.

Mediastinum/Nodes: No enlarged mediastinal, hilar, or axillary lymph
nodes. Thyroid gland, trachea, and esophagus demonstrate no
significant findings.

Lungs/Pleura: Trace dependent basilar atelectasis. No acute airspace
process, pneumonia, collapse or consolidation. Negative for edema or
interstitial process. No pleural effusion or pneumothorax.

Upper Abdomen: Similar scattered numerous hepatic hypodense cysts.
Liver is only partially imaged. Gallbladder nondistended. Suspect
layering gallbladder sludge versus noncalcified stones. Common bile
duct nondilated. Colonic diverticulosis. Aortic atherosclerosis. No
acute upper abdominal finding.

Musculoskeletal: Degenerative changes of the spine. No chest wall
soft tissue abnormality or asymmetry. No acute osseous finding.

Review of the MIP images confirms the above findings.
IMPRESSION: Negative for significant acute central PE as above. Limited
assessment of the smaller peripheral branches.

No other acute intrathoracic vascular or non vascular finding.

Coronary atherosclerosis

Aortic Atherosclerosis ([id number]).

## 2023-03-19 ENCOUNTER — Encounter (HOSPITAL_COMMUNITY): Payer: Self-pay | Admitting: Orthopedic Surgery

## 2023-08-04 NOTE — Discharge Summary (Signed)
 Discharge Summary  Name: Harry [name] MRN: [id number] Age: 73 yrs DOB: 03-05-1951  Admit date: 08/04/2023 Discharge date and time: 08/08/2023  Admitting Physician: Nancylee Bennett Seat, MD Discharge Physician: Dr. Seat  Admission Diagnoses:  Joint infection (CMD) [M00.9] Chronic infection of prosthetic knee, sequela [T84.59XS, Z96.659]   Discharge Diagnoses:  Streptococcus pyogenes bacteremia Concern of Streptococcus pyogenes infection involving prosthetic joint History of chronic prosthetic joint infection A-fib Heart failure with reduced ejection fraction  Admission Condition: stable Discharged Condition: good  Hospital Course:  For full details, please see H&P, progress notes, consult notes and ancillary notes. Briefly,  73 y.o. male with with history of A-fib on Eliquis , right knee surgeries previously on suppressive antibiotics for infections, Heart failure COPD presented to ED with recurrence of chronic prosthetic joint infection with draining purulence . Hospital course will be addressed in a problem based format as below.  #Status post total knee arthroplasty #History of chronic prosthetic joint infection --Patient is s/p right total knee arthroplasty 01/07/2021 by Dr. Fidel at [facility]. Post-op course complicated by knee instability and subsequent dislocation s/p closed reduction of right total knee arthroplasty 01/20/2021 and revision right total knee arthroplasty, femoral and tibial components and repair of extensor mechanism right knee 01/21/2021 by Dr. Fidel. Post-op course complicated by wound necrosis s/p irrigation and debridement and placement of wound vac 03/18/2021.  positive for rare staphylococcus haemolyticus, on Vancomycin  and Ceftriaxone ,  S/p right TKA revision with wound VAC application on 05/28/21, s/p wound closure with flap and skin graft with plastics Dr. Jackson on 06/01/21. Operative culture (+) Serratia. Infectious Diseases consulted and treated  with IV Ertapenem,  through 07/09/2021.  After that he reports he was on Keflex and transition to oral doxycycline that he was on prior to admission.  On admission had leukocytosis to 22 is febrile to a Tmax of 100.5, and tachycardic.  S/p vancomycin  and ceftriaxone  in the ED.  Blood cultures obtained and showed strep pyogenes. Wound culture obtianed and grew strep pyogenes.  CT surgery was consulted and they believe that due to comorbidities and complexity for further planning continued infection may need to discuss next labs in the outpatient setting including washout or amputation.  Symptoms improving with resolution of leukocytosis as of 2/15.  Patient is additionally has history of Staph epidermidis PJI.  In the setting of this knowledge antibiotics transition to oral regimen of amoxicillin  1 g by mouth 3 times daily for 4 weeks, rifampin 300 mg by mouth twice daily for 4 weeks, restarting home antibiotic doxycycline 100 mg by mouth twice daily suppressive indefinitely.  # A-fib #Heart failure reduced ejection fraction EKG on admission showed A-fib ventricular rate of 106 that improved with improvement in fever curve.  He was continued on his metoprolol  25 mg daily, amiodarone 200 mg twice daily and Eliquis  5 mg twice daily.SABRA  He also has a history of heart failure with reduced ejection fraction last EF 20 to 25% with severe global hypokinesis of the left ventricle noted in 2024.  He was continued on his home GDMT with metoprolol  as above on the first day of admission lisinopril  was held last home oral Lasix in the setting of borderline hypotension.  Patient was restarted on home Lasix on 08/07/2023.  Home lisinopril  was held to PCP follow-up  On day of discharge, patient is clinically stable with no new examination findings or acute symptoms compared to prior.  The patient was seen by the attending physician on the date of discharge  and deemed stable and acceptable for discharge.  The patient's chronic  medical conditions were treated accordingly per the patient's home medication regimen.  The patient's medication reconciliation, follow-up appointments, discharge orders, instructions, and significant lab and diagnostic studies are as noted.   Medication changes: Amoxicillin  1g by mouth TID x 4 weeks Rifampin 300mg  by mouth BID x 4 weeks Restart home suppressive antibiotic doxycycline 100mg  by mouth BID indefinitely Hold home lisinopril  Resume taking home amiodarone twice daily as prescribed  Discharge Follow-up Action Items: Follow up with PCP in 1-2 weeks.  Please follow-up on blood pressure with medication changes as above lisinopril  was held in the setting of presenting hypotension Follow-up with Dr. Markham infectious disease via telephone encounter in 2 to 3 weeks with plans to follow-up on CBC, CMP, ESR, CRP in 1 week labs per home health Abx end dates as above and medication changes Wound care consulted recommendations below: Gently cleanse right knee and right lower thigh wound with vashe wound cleanser. Place Aquacel silver over the wound bed to help manage drainage. Cover with ABD pad to aid with managing drainage. Then secure with kerlex. Change daily and prn soiling, until seen by Orthopedics. Place mepilex border to the sacral area for protection. Change every M-W-WF and prn soiling. Use waffle cushion and turn pt frequently every 2 hours to redistribute pressure. Place mepilex border to bilateral heels for protection. Change every M-W-WF and prn soiling. Use ZFLEX boots to elevate heels and redistribute pressure.  Patient's Ordered Code Status: Full Code  Physical Exam Constitutional: NAD, resting in bed, well-nourished, conversational HEENT: EOMI, PERRL, MMM Neck: Supple and full range of motion Cardiovascular: regular rhythm, normal S1 and S2, no m/r/g, peripheral pulses palpable Respiratory: Lungs clear to auscultation bilaterally. Normal work of breathing on room air   Abdominal: Soft, nondistended, nontender to palpation  Skin: No obvious rashes or lesions. Warm and dry.  MSK: Normal strength and range of motion in all extremities.  No peripheral edema.  Neuro: Alert and oriented x3. Following commands. No focal deficits.  Psych: Appropriate mood and affect   Consults: IP CONSULT TO HOSPITALIST WOUND OSTOMY EVAL AND TREAT IP CONSULT TO ORTHOPEDIC SURGERY  Significant Diagnostic Studies:  CBC  Recent Labs    08/08/23 0137  WBC 8.47  RBC 4.86  HGB 13.6*  HCT 40.5*  MCV 83.4  MCH 27.9  MCHC 33.5  RDW 17.2*  PLT 173  MPV 8.7   BMP  Recent Labs    08/08/23 0137  NA 140  K 3.3*  CL 111*  CO2 23  BUN 13  CREATININE 0.76  CALCIUM  8.1*      Disposition: Home Health Care home  Patient Instructions:    Medication List     PAUSE taking these medications    lisinopriL  2.5 mg tablet Wait to take this until your doctor or other care provider tells you to start again. Commonly known as: PRINIVIL  Take 1 tablet (2.5 mg total) by mouth daily.       START taking these medications    amoxicillin  500 mg capsule Commonly known as: AMOXIL  Take 2 capsules (1,000 mg total) by mouth every 8 (eight) hours for 10 days.       CHANGE how you take these medications    amiodarone 200 mg tablet Commonly known as: PACERONE Take 1 tablet (200 mg total) by mouth 2 (two) times a day. What changed: when to take this       CONTINUE taking these  medications    acetaminophen  500 mg tablet Commonly known as: TYLENOL  Take 1,000 mg by mouth 2 (two) times a day as needed for mild pain (1-3) or moderate pain (4-6).   apixaban  5 mg Tab Commonly known as: ELIQUIS  Take 1 tablet by mouth 2 (two) times a day.   atorvastatin  20 mg tablet Commonly known as: LIPITOR Take 20 mg by mouth every evening.   calcium  carbonate-vitamin D3 600 mg-12.5 mcg (500 unit) Cap Take 1 capsule by mouth every morning.   doxycycline 100 mg capsule Commonly  known as: VIBRAMYCIN Take 100 mg by mouth daily.   ferrous sulfate 325 mg (65 mg iron) tablet Take 325 mg by mouth daily before breakfast.   furosemide 20 mg tablet Commonly known as: LASIX Take 20 mg by mouth daily.   methocarbamoL  500 mg tablet Commonly known as: ROBAXIN  Take 500 mg by mouth every 12 (twelve) hours as needed for muscle spasms.   metoprolol  succinate 25 mg 24 hr tablet Commonly known as: TOPROL  XL Take 1 tablet (25 mg total) by mouth daily.   omeprazole 20 mg DR capsule Commonly known as: PriLOSEC Take 20 mg by mouth every evening.   potassium chloride  20 mEq ER tablet Take 20 mEq by mouth every other day.   PreserVision AREDS-2 250-90-40-1 mg Cap capsule Generic drug: vit C,E-Zn-coppr-lutein-zeaxan Take 1 capsule by mouth 2 (two) times a day.   traMADoL 50 mg tablet Commonly known as: ULTRAM Take 50 mg by mouth every 8 (eight) hours as needed.       STOP taking these medications    Lucentis 0.3 mg/0.05 mL intravitreal injection Generic drug: ranibizumab         Where to Get Your Medications     These medications were sent to [facility]  [address], [place] [address]    Hours: Mon-Fri 8:30am-5pm; Sat-Sun: Closed; Holidays: Closed Phone: [phone]  amoxicillin  500 mg capsule      Discharge Orders     Full Code     Home Health Skilled Nursing     Details:    Home Health Skilled Nursing Service:  Wound Care Resume Home Health Education     Education on Wound Care: Yes   Monitoring for Healing: Yes   Education Needed:  Disease Process Education Other (see comment)     Comments: Resumption of wound care   Lifting Limits:     Details:    Lifting Limits: No lifting limits   Occupational Therapy Home Health Coordination     Details:    Actions: Resume Home Health   Return to previous diet     Details:    Diet type: Return to previous diet   Ambulatory referral to PCP     Comments: Needs 1-2  week follow up with PCP after hospital discharge        Follow-up  Future Appointments  Date Time Provider Department Center  09/19/2023  2:00 PM [address] [place], PA-C [facility] [address]   Appointment requested with Dr. Markham by telehealth for 2/25.  [place] [facility] General Medicine Attending I interviewed and examined the patient and co-developed the impression, plan and recommendations documented in the resident's note.  I reviewed the resident's summary and agree with the content and discharge plan as written. I spent 35 minutes face to face with the patient and spouse for discharge counseling and coordination of care. Please see plan of care note  from today for additional details on antibiotic and follow-up plan.  Nancylee Bennett Seat  [facility] 08/08/2023 4:30 PM

## 2023-08-06 NOTE — Consults (Addendum)
 PHARMACY FOLLOW-UP CONSULT NOTE - VANCOMYCIN  [facility]  Harry [name] is a 73 y.o. male, (!) 139 kg (307 lb 5.1 oz), being followed for vancomycin  dosing for bacteremia/endovascular infection.  Today is day 3 of vancomycin  therapy.  Current Regimen: 1.5gm IV Q12 hours Other Abx: Ceftriaxone   VITAL SIGNS/LABS Recent Labs    08/04/23 1223 08/05/23 0619 08/06/23 0351  BUN 30* 33* 26*  CREATININE 1.23 1.32* 0.97  WBC 22.61* 16.17* 10.54    Serum Creatinine Assessment: Decreased from prior  PERTINENT CULTURE DATA Date Source Culture Information  02/13 Blood 1/2 Strep Pyogenes               PHARMACOKINETIC DATA  Med Administrations and Associated Flowsheet Values (last 48 hours)     Date/Time Action Medication Dose Rate   08/06/23 0552 New Bag   vancomycin  1.5 g in sodium chloride  0.9% 250 mL IVPB 1,500 mg 166.7 mL/hr   08/05/23 1619 New Bag   vancomycin  1.5 g in sodium chloride  0.9% 250 mL IVPB 1,500 mg 166.7 mL/hr   08/05/23 0410 New Bag   vancomycin  1.5 g in sodium chloride  0.9% 250 mL IVPB 1,500 mg 166.7 mL/hr   08/04/23 1621 New Bag   vancomycin  2 g in sodium chloride  0.9% 500 mL IVPB 2,000 mg 250 mL/hr       Vancomycin  Level, Trough (ug/mL)  Date/Time Value  08/06/2023 0351 17.3 (H)   Vancomycin  Level, Peak (ug/mL)  Date/Time Value  08/06/2023 1028 26.4    AUC data trough-dose-peak: Current dose (g) of vancomycin : 1.5 g Current frequency : Every 12 hours Current dosing interval (hours): 12 Date vancomycin  given - for dose before trough drawn: 08/05/23 Time at start of infusion (SOI) - for dose before trough drawn: 1619 Date of 1st level: 08/06/23 Time of 1st level (T1): 0351 1st level - C1 (mcg/mL): 17.3 mcg/mL Date of 2nd level: 08/06/23 Time of 2nd level (T2): 1028 2nd level - C2 (mcg/mL): 26.4 mcg/mL Ke: 0.061 AUC per dose: 297.4 AUC per 24 hours: 594.8  ASSESSMENT/PLAN  Assessment  of levels: Levels drawn appropriately and are able to be used in pharmacokinetic monitoring  Calculated AUC/MIC:  Therapeutic drug monitoring target: AUC 400-600; patient is within goal range on current regimen with a projected AUC of 595.  Adjust current vancomycin  maintenance dose to 1.25gm IV Q12 hours with new anticipated AUC of 496 Will plan to recheck trough in 3-5 days, or sooner if clinical condition or renal function changes  Infectious Diseases Consult service guiding antibiotic treatment and/or duration: NO  Thank you for the consult. Pharmacy will continue to monitor renal function, serum drug concentrations, and clinical status of the patient until therapy is discontinued. Please feel free to contact me with any further questions.

## 2023-10-04 NOTE — Progress Notes (Signed)
 [facility] MSK ORT SM LINDS  Tue 10/04/2023   Orthopedics New Patient Note  Patient ID:  Harry [name] is a 73 y.o. male   Assessment:  Right knee pain, stiffness, chronic infection status post total knee arthroplasty with multiple revisions as well as extensor mechanism reconstruction.  Still has draining sinus.  Significant comorbidities including severe COPD and atrial fibrillation on Eliquis .    Plan:  Discussed treatment options.  At this point, I do not see a way to salvage his knee replacement from this chronic infection and multiple reconstruction/revisions.  He is on chronic suppressive antibiotics and he has no signs of sepsis or effects on other body systems.  Explained that he can continue with his current antibiotic suppression and put up with the draining sinus or the other option would be above-knee amputation.  He states he is considering both treatment options and will let me know if anything changes.    Chief Complaint:  Right Knee Pain and Swelling, Hx of Right Total Knee Arthroplasty with several revisions    History of Present Illness:   Harry [name] is a 73 y.o. year old male who is seen today for second opinion evaluation of right knee pain and drainage status post total knee arthroplasty in [facility] in 2022 followed by multiple revisions and extensor reconstruction by Dr. Licia in [place] in 2023.  He states that he still has pain and drainage despite taking amoxicillin .  He has been treated for his chronic infection with IV antibiotics in the past.  Currently states that the knee is very weak.  Sits in his chair most days.  Constant aching.  No systemic signs of infection.  Usually has to change his dressing once a day because of drainage..    Vital Signs  There were no vitals taken for this visit. There is no height or weight on file to calculate BMI.  Physical Exam  Pulse 74.  Alert, oriented, moderately obese in no acute distress.   Appropriate affect.  The left knee has 25 to 100 degrees of flexion with valgus and crepitus.  Moderate weakness.  No gross instability.  Partially correctable.  Significant weakness.  The right knee has 35 to 100 degrees of flexion.  No significant erythema but he has draining sinus lateral knee with serous fluid.  No effusion.  No gross instability.  Moderate weakness in flexion.  He has no significant active extension.  Calves are soft and nontender with negative Homan.  Both feet have normal sensation and brisk capillary refill distally.  Radiology  No results found.   Allergies  Opioids - morphine  analogues   Medications    Current Outpatient Medications  Medication Sig Dispense Refill  . acetaminophen  (TYLENOL ) 500 mg tablet Take 1,000 mg by mouth 2 (two) times a day as needed for mild pain (1-3) or moderate pain (4-6).    SABRA amiodarone (PACERONE) 200 mg tablet Take 1 tablet (200 mg total) by mouth 2 (two) times a day. 60 tablet 1  . amoxicillin  (AMOXIL ) 500 mg capsule Take 2 capsules (1,000 mg total) by mouth every 8 (eight) hours. 180 capsule 2  . apixaban  (ELIQUIS ) 5 mg tab Take 1 tablet by mouth 2 (two) times a day.    . atorvastatin  (LIPITOR) 20 mg tablet Take 20 mg by mouth every evening.    . calcium  carbonate-vitamin D3 600 mg-12.5 mcg (500 unit) cap Take 1 capsule by mouth every morning.    SABRA doxycycline (VIBRAMYCIN) 100 mg  capsule Take 100 mg by mouth daily.    . ferrous sulfate 325 mg (65 mg iron) tablet Take 325 mg by mouth daily before breakfast.    . furosemide (LASIX) 20 mg tablet Take 20 mg by mouth daily.    . [Paused] lisinopriL  (PRINIVIL ) 2.5 mg tablet Take 1 tablet (2.5 mg total) by mouth daily. 30 tablet 11  . methocarbamoL  (ROBAXIN ) 500 mg tablet Take 500 mg by mouth every 12 (twelve) hours as needed for muscle spasms.    . metoprolol  succinate (TOPROL  XL) 25 mg 24 hr tablet Take 1 tablet (25 mg total) by mouth daily. 90 tablet 3  . omeprazole (PriLOSEC) 20 mg  DR capsule Take 20 mg by mouth every evening.    . potassium chloride  (KLOR-CON ) 20 mEq ER tablet Take 20 mEq by mouth every other day.    . traMADoL (ULTRAM) 50 mg tablet Take 50 mg by mouth every 8 (eight) hours as needed.    . vit C,E-Zn-coppr-lutein-zeaxan (PreserVision AREDS-2) 250-90-40-1 mg cap capsule Take 1 capsule by mouth 2 (two) times a day.     No current facility-administered medications for this visit.     Past Medical History  Past Medical History:  Diagnosis Date  . Atrial fibrillation    (CMD) 12/2020   Eliquis ; cardiac workup done at [facility] in [place], [place] patient does not have a record of the cardiologist name but PCP took over in Eliquis  refills  . COPD (chronic obstructive pulmonary disease)    (CMD)    minor and no treatment needed after pulmonary diagnostics in 2021  . GERD (gastroesophageal reflux disease)   . History of transfusion    no reaction; from multiple surgeries in summer 2022     Past Surgical History  Past Surgical History:  Procedure Laterality Date  . COLONOSCOPY    . SKIN BIOPSY  2007   nose skin cancer  . TOTAL KNEE ARTHROPLASTY Right    multiple replacement since first one tore the patella tendon     Family History  No family history on file.   Social History:  Social History   Socioeconomic History  . Marital status: Married    Spouse name: Not on file  . Number of children: Not on file  . Years of education: Not on file  . Highest education level: Not on file  Occupational History  . Not on file  Tobacco Use  . Smoking status: Former    Current packs/day: 0.00    Types: Cigarettes    Quit date: 2006    Years since quitting: 19.2  . Smokeless tobacco: Never  Vaping Use  . Vaping status: Former  Substance and Sexual Activity  . Alcohol  use: Not Currently    Alcohol /week: 3.0 standard drinks of alcohol     Types: 1 Glasses of wine, 1 Cans of beer, 1 Shots of liquor per week  .  Drug use: Never  . Sexual activity: Not on file  Other Topics Concern  . Not on file  Social History Narrative  . Not on file   Social Drivers of Health   Food Insecurity: Not on file  Transportation Needs: No Transportation Needs (05/29/2021)   Received from [facility]   [facility] - Transportation   . Lack of Transportation (Medical): No   . Lack of Transportation (Non-Medical): No  Safety: Unknown (09/25/2021)   [facility], [facility]   HITS   . Physically  Hurt: Not on file   . Insult or Talk Down To: Not on file   . Threaten Physical Harm: Not on file   . Scream or Curse: Not on file  Living Situation: Not on file     Review of Systems  Review of Systems       All other systems are reviewed and are negative except as per HPI.  Problem List  Patient Active Problem List  Diagnosis  . GERD (gastroesophageal reflux disease)  . COPD (chronic obstructive pulmonary disease)    (CMD)  . Atrial fibrillation    (CMD)  . Class 1 obesity in adult  . Infection of total knee replacement, initial encounter  . Acute blood loss anemia  . Chronic infection of prosthetic knee, sequela       Vinie KYM Fonder, MD        Note - This record has been created using Dragon software. Chart creation errors have been sought, but may not always have been located. Such creation errors do not reflect on the standard of medical care.

## 2024-02-15 NOTE — Telephone Encounter (Signed)
 Patient spouse-[name] calling had to reschedule pt appt. Rescheduled to 9/10. Pt spouse would like to know if Dr. Juna would be willing to do a video visit instead of in person visit due to patient being bed ridden. Please contact spouse to advise.  Cb# [phone]
